# Patient Record
Sex: Female | Born: 1935
Health system: Southern US, Community
[De-identification: ages and names within clinical notes are randomized; demographics above are authoritative.]

## PROBLEM LIST (undated history)

## (undated) DIAGNOSIS — E079 Disorder of thyroid, unspecified: Secondary | ICD-10-CM

## (undated) DIAGNOSIS — I10 Essential (primary) hypertension: Secondary | ICD-10-CM

## (undated) DIAGNOSIS — K219 Gastro-esophageal reflux disease without esophagitis: Secondary | ICD-10-CM

## (undated) DIAGNOSIS — M3500 Sicca syndrome, unspecified: Secondary | ICD-10-CM

## (undated) DIAGNOSIS — K7460 Unspecified cirrhosis of liver: Secondary | ICD-10-CM

## (undated) DIAGNOSIS — I509 Heart failure, unspecified: Secondary | ICD-10-CM

## (undated) DIAGNOSIS — K922 Gastrointestinal hemorrhage, unspecified: Secondary | ICD-10-CM

## (undated) HISTORY — PX: OTHER SURGICAL HISTORY: SHX169

## (undated) HISTORY — PX: COLONOSCOPY WITH ESOPHAGOGASTRODUODENOSCOPY (EGD) AND ESOPHAGEAL DILATION (ED): SHX6495

---

## 2001-07-05 ENCOUNTER — Other Ambulatory Visit: Admission: RE | Admit: 2001-07-05 | Discharge: 2001-07-05 | Payer: Self-pay | Admitting: Internal Medicine

## 2001-07-19 ENCOUNTER — Encounter: Payer: Self-pay | Admitting: Internal Medicine

## 2001-07-19 ENCOUNTER — Ambulatory Visit (HOSPITAL_COMMUNITY): Admission: RE | Admit: 2001-07-19 | Discharge: 2001-07-19 | Payer: Self-pay | Admitting: Internal Medicine

## 2002-01-07 ENCOUNTER — Encounter: Payer: Self-pay | Admitting: Rheumatology

## 2002-01-07 ENCOUNTER — Ambulatory Visit (HOSPITAL_COMMUNITY): Admission: RE | Admit: 2002-01-07 | Discharge: 2002-01-07 | Payer: Self-pay | Admitting: Rheumatology

## 2002-02-14 ENCOUNTER — Ambulatory Visit (HOSPITAL_COMMUNITY): Admission: RE | Admit: 2002-02-14 | Discharge: 2002-02-14 | Payer: Self-pay | Admitting: Internal Medicine

## 2002-03-21 ENCOUNTER — Encounter (HOSPITAL_COMMUNITY): Admission: RE | Admit: 2002-03-21 | Discharge: 2002-04-20 | Payer: Self-pay | Admitting: Rheumatology

## 2002-05-10 ENCOUNTER — Ambulatory Visit (HOSPITAL_COMMUNITY): Admission: RE | Admit: 2002-05-10 | Discharge: 2002-05-10 | Payer: Self-pay | Admitting: Rheumatology

## 2002-07-11 ENCOUNTER — Encounter (HOSPITAL_COMMUNITY): Admission: RE | Admit: 2002-07-11 | Discharge: 2002-08-10 | Payer: Self-pay | Admitting: Oncology

## 2002-07-31 ENCOUNTER — Encounter (HOSPITAL_COMMUNITY): Admission: RE | Admit: 2002-07-31 | Discharge: 2002-08-30 | Payer: Self-pay | Admitting: *Deleted

## 2002-08-30 ENCOUNTER — Encounter (HOSPITAL_COMMUNITY): Admission: RE | Admit: 2002-08-30 | Discharge: 2002-09-29 | Payer: Self-pay | Admitting: *Deleted

## 2002-09-30 ENCOUNTER — Encounter (HOSPITAL_COMMUNITY): Admission: RE | Admit: 2002-09-30 | Discharge: 2002-10-30 | Payer: Self-pay | Admitting: *Deleted

## 2002-11-01 ENCOUNTER — Encounter (HOSPITAL_COMMUNITY): Admission: RE | Admit: 2002-11-01 | Discharge: 2002-12-01 | Payer: Self-pay | Admitting: *Deleted

## 2002-12-02 ENCOUNTER — Encounter (HOSPITAL_COMMUNITY): Admission: RE | Admit: 2002-12-02 | Discharge: 2003-01-01 | Payer: Self-pay | Admitting: *Deleted

## 2002-12-12 ENCOUNTER — Encounter (HOSPITAL_COMMUNITY): Admission: RE | Admit: 2002-12-12 | Discharge: 2003-01-11 | Payer: Self-pay | Admitting: Rheumatology

## 2003-02-18 ENCOUNTER — Ambulatory Visit (HOSPITAL_COMMUNITY): Admission: RE | Admit: 2003-02-18 | Discharge: 2003-02-18 | Payer: Self-pay | Admitting: Family Medicine

## 2003-02-18 ENCOUNTER — Encounter: Payer: Self-pay | Admitting: Family Medicine

## 2003-04-08 ENCOUNTER — Ambulatory Visit (HOSPITAL_COMMUNITY): Admission: RE | Admit: 2003-04-08 | Discharge: 2003-04-08 | Payer: Self-pay | Admitting: Family Medicine

## 2003-04-08 ENCOUNTER — Encounter: Payer: Self-pay | Admitting: Family Medicine

## 2003-05-29 ENCOUNTER — Encounter (HOSPITAL_COMMUNITY): Admission: RE | Admit: 2003-05-29 | Discharge: 2003-06-28 | Payer: Self-pay | Admitting: Rheumatology

## 2004-04-01 ENCOUNTER — Ambulatory Visit (HOSPITAL_COMMUNITY): Admission: RE | Admit: 2004-04-01 | Discharge: 2004-04-01 | Payer: Self-pay | Admitting: Family Medicine

## 2004-04-07 ENCOUNTER — Ambulatory Visit (HOSPITAL_COMMUNITY): Admission: RE | Admit: 2004-04-07 | Discharge: 2004-04-07 | Payer: Self-pay | Admitting: Family Medicine

## 2004-05-04 ENCOUNTER — Ambulatory Visit (HOSPITAL_COMMUNITY): Admission: RE | Admit: 2004-05-04 | Discharge: 2004-05-04 | Payer: Self-pay | Admitting: Pulmonary Disease

## 2005-04-12 ENCOUNTER — Ambulatory Visit (HOSPITAL_COMMUNITY): Admission: RE | Admit: 2005-04-12 | Discharge: 2005-04-12 | Payer: Self-pay | Admitting: Family Medicine

## 2007-06-27 ENCOUNTER — Ambulatory Visit (HOSPITAL_COMMUNITY): Admission: RE | Admit: 2007-06-27 | Discharge: 2007-06-27 | Payer: Self-pay | Admitting: Family Medicine

## 2007-11-14 ENCOUNTER — Ambulatory Visit (HOSPITAL_COMMUNITY): Admission: RE | Admit: 2007-11-14 | Discharge: 2007-11-14 | Payer: Self-pay | Admitting: Unknown Physician Specialty

## 2007-11-15 ENCOUNTER — Ambulatory Visit: Payer: Self-pay | Admitting: Cardiology

## 2008-06-13 ENCOUNTER — Ambulatory Visit (HOSPITAL_COMMUNITY): Admission: RE | Admit: 2008-06-13 | Discharge: 2008-06-13 | Payer: Self-pay | Admitting: Family Medicine

## 2010-08-02 ENCOUNTER — Ambulatory Visit: Payer: Self-pay | Admitting: Cardiology

## 2010-08-02 ENCOUNTER — Encounter: Payer: Self-pay | Admitting: Cardiology

## 2010-08-02 ENCOUNTER — Ambulatory Visit (HOSPITAL_COMMUNITY): Admission: RE | Admit: 2010-08-02 | Discharge: 2010-08-02 | Payer: Self-pay | Admitting: Family Medicine

## 2011-03-04 NOTE — Consult Note (Signed)
[name], [name]                        ACCOUNT NO.:  [id number]   MEDICAL RECORD NO.:  [id number]                   PATIENT TYPE:  OUT   LOCATION:  RDC                                  FACILITY:  [facility]   PHYSICIAN:  Aundra Dubin, M.D.            DATE OF BIRTH:  02-27-1933   DATE OF CONSULTATION:  05/29/2003  DATE OF DISCHARGE:                                   CONSULTATION   CHIEF COMPLAINT:  CREST syndrome.   HISTORY OF PRESENT ILLNESS:  [name] is a 77 year old woman with CREST  syndrome.  She reports that she has been doing better concerning the  dryness, particularly to her eyes.  She has actually had an occasion where  she has had a few tears.  She still has the punctal plugs.  She finds the  Salagen continues to help, and she ranges this 2 to 3 pills per day.  The  Raynaud's symptoms are mild.  She does better in the summer.  She has not  needed medicine for this for awhile.  Her heartburn symptoms are stable.  She has a list of questions and asks if the medicines are causing her hair  to thin, and none of the medicines are doing this.  Her energy level is  fair.  She has started exercising at [employer], and she feels better on the  days she exercises.  Her weight is down 6 pounds.  She says her breathing  has improved.   Her worst area of arthralgia is pain that radiates around the left lateral  hip and sometimes down her leg.  She says it moves around.  She cannot lie  on this at night, and generally walking does not bother this.   MEDICINES:  1. Salagen 5 mg b.i.d.  2. Prilosec 20 mg b.i.d.  3. HCTZ 25 mg daily.  4. Armor Thyroid 180 mg daily.  5. Eyedrops.  6. Glucosamine.  7. Premarin 0.625 mg three days weekly.   PHYSICAL EXAM:  Weight 131 pounds.  Blood pressure 126/72.  Respirations 14.  GENERAL:  She appears well.  LUNGS:  There are no crackles or rales at this time.  SKIN:  She has no true scleroderma to the fingers.  There is still the  positive nail-fold dilatation.  HEART:  Regular.  No murmur.  MUSCULOSKELETAL EXAM:  The hand shows sclerodactyly which is mild.  The  fingers are slightly cool and slightly whitish.  Wrist, elbow, and  shoulders, good range of motion.  Trigger points have mild tenderness.  The  bilateral hips move very well, and there is no pain in this area.  She is  moderately tender to the left trochanteric bursa area.  The knees and ankles  are not tender.   ASSESSMENT/PLAN:  1. CREST syndrome.  Basically, these symptoms are stable.  She also has     Sjogren's which is also stable.  Her  eyes are actually improved.  She     will continue with the p.r.n. use of Salagen.  She has not been on a     calcium channel blocker for the Raynaud's for close to one year at this     time.  If this worsens into the Fall, she will call and restart this.  I     again advised her to dress warmly to try and help this when needed.  She     will continue with the Prilosec for the reflux.  I get no sense that her     fatigue or breathing has worsened.  I do not feel that I need to further     evaluate at the present time with PFTs or an echocardiogram.  2. Left trochanteric bursitis.  I offered to inject this.  She was not     interested at the present time, and I have explained how she can stretch     this.  She also has some Vioxx at home which may help with this.  If this     worsens, she will call for an appointment and I can inject this.   She will return in six months.   Time:  26 minutes.                                               Aundra Dubin, M.D.    WWT/MEDQ  D:  05/29/2003  T:  05/29/2003  Job:  [id number]   cc:   Corrie Mckusick, M.D.  [address] [place]ure[place] Josephs. A  [place]  [place] [address]  Fax: [phone]

## 2011-03-04 NOTE — Procedures (Signed)
[name], [name]              ACCOUNT NO.:  [id number]   MEDICAL RECORD NO.:  [id number]          PATIENT TYPE:  OUT   LOCATION:  RAD                           FACILITY:  [facility]   PHYSICIAN:  Gerrit Friends. Dietrich Pates, MD, FACCDATE OF BIRTH:  08/08/1933   DATE OF PROCEDURE:  DATE OF DISCHARGE:                                ECHOCARDIOGRAM   CLINICAL DATA:  A 77 year old woman with Sjogren's syndrome and right  sided cardiac enlargement.   M-mode aorta 2.6, left atrium 4.0, septum 1.3, posterior wall 1.1, LV  diastole 3.3, LV systole 2.0.  1. Technically suboptimal but adequate echocardiographic study.  2. Left atrial size at the upper limit of normal; normal right atrium      and right ventricle.  3. Mild sclerosis of a trileaflet aortic valve.  4. Normal in diameter of the proximal ascending aorta; mild to      moderate calcification of the wall and annulus.  5. Normal pulmonic valve and proximal pulmonary artery.  6. Normal mitral valve; mild annular calcification; minimal      regurgitation.  7. Normal left ventricular size; LV wall thickness at the upper limit      of normal with more prominent proximal septal thickening.  Normal      regional and global LV systolic function.  8. Normal IVC.      Gerrit Friends. Dietrich Pates, MD, [facility]  Electronically Signed     RMR/MEDQ  D:  11/15/2007  T:  11/15/2007  Job:  [id number]

## 2011-03-04 NOTE — Consult Note (Signed)
[name], [name]                        ACCOUNT NO.:  [id number]   MEDICAL RECORD NO.:  [id number]                   PATIENT TYPE:  OUT   LOCATION:  RAD                                  FACILITY[facility]  [facility]   PHYSICIAN:  Aundra Dubin, M.D.            DATE OF BIRTH:  1933-09-12   DATE OF CONSULTATION:  07/11/2002  DATE OF DISCHARGE:  05/10/2002                                   CONSULTATION   CHIEF COMPLAINT:  CREST, Sjogren's, right shoulder/right hand.   HISTORY OF PRESENT ILLNESS:  The patient did have an echocardiogram which  estimated her right ventricular systolic pressure at 35-45 which is a mild  elevation.  This may imply that she has some mild pulmonary hypertension.  The pulmonary function test showed normal lung volumes and diffusing  capacity.  She had a blood gas showing 7.4/37/83.   At this time she is hurting in the left shoulder.  This has been going on  for several months and she is to see Dr. Hayden Rasmussen next week.  Also, she  is hurting in the right wrist.  She has been diagnosed with carpal tunnel  syndrome.  She has some mild numbness to her fingers at night.  She has just  started using a wrist splint.  She continues to have significant dryness to  her mouth.  Her eyes are not bad.  There has been really no swollen joints.  Her swallowing difficulties and heartburn are under control with treatment.  Her weight is stable.  She denies any shortness of breath or chest pain.   MEDICATIONS:  1. HCTZ 25 mg q.d.  2. Armour Thyroid 180 mg q.d.  3. Premarin 0.625 mg three days a week.  4. Prilosec 20 mg q.d.  5. Multivitamin.  6. Stanback p.r.n.  7. No Ambien.  8. Singulair p.r.n.  9. Flexeril p.r.n.  10.      Bextra 10 mg b.i.d.   PHYSICAL EXAMINATION:  VITAL SIGNS:  Weight 142 pounds, blood pressure  108/70, respirations 12.  GENERAL:  No distress.  SKIN:  She has positive nail fold dilatation.  There may be some trace  telangiectasias to the  lips.  Mouth is clear and the tongue appears to have  normal moisture at the moment.  NECK:  Negative JVD.  LUNGS:  Clear.  HEART:  Regular.  No murmur.  MUSCULOSKELETAL:  She has likely a positive Tinel sign to the right wrist.  The hands have some diffuse swelling to suggest sclerodactyly.  The left  shoulder has poor abduction leniency to less than 80 degrees.  Internal and  external rotation is also mildly limited.  Knees, ankles, and feet have a  good range of motion and show no active arthritis.   ASSESSMENT/PLAN:  1. CREST syndrome with Sjogren's.  Generally, these symptoms are stable, but     are persistent.  I will try her on Salagen  5 mg b.i.d.-t.i.d. p.r.n.  She     is advised that this can cause diarrhea, sweating, and nervousness among     other symptoms.  She will continue with Prilosec for her reflux.  Her     echocardiogram shows a hint of mild pulmonary hypertension.  We will     repeat the echocardiogram in about one year.  She is currently not short     of breath.  2. Left shoulder tendonitis.  We will have her follow through with the     appointment with Dr. Thomasena Edis.  3. Carpal tunnel syndrome.  If this is not improving with the wrist splint     or suggestions by Dr. Thomasena Edis, then I would give consideration to doing     an injection.   She will return in five weeks.                                               Aundra Dubin, M.D.    WWT/MEDQ  D:  07/11/2002  T:  07/11/2002  Job:  [id number]

## 2011-03-04 NOTE — Consult Note (Signed)
[facility]  Patient:    SHEY, YOTT Visit Number: [id number] MRN: [id number]          Service Type: RHE Location: [facility] Attending Physician:  Aundra Dubin Dictated by:   Nathaneil Canary, M.D. Admit Date:  03/21/2002   CC:         Colette Ribas, M.D.  Roetta Sessions, M.D.   Consultation Report  CHIEF COMPLAINT:  Sjogrens, CREST.  HISTORY OF PRESENT ILLNESS:  Ms. Devino returns reporting that the Evoxac did not help the moisture to her mouth or eyes.  She did see Dr. Roetta Sessions for an evaluation of her swallowing difficulty, and I believe she has had an esophageal dilatation procedure.  She feels that the swallowing is some better. She has found that the Xanax at night dries her eyes and mouth fairly severely and she uses this only occasionally.  She still has moderate insomnia.  She has been given a prescription for Ambien from Dr. Colette Ribas which would be a good choice.  She is not hurting accepts describes a few episodes that occur about once every four weeks where she hurts in her legs for a while at night.  When she stands up and moves, she is better.  She is still having significant dry eyes and dry mouth.  She tells me today that she has had punctal plugging for her eyes.  Her weight is down one and a half pounds.  She has moderate fatigue.  There is no complaint of shortness of breath.  MEDICATIONS: 1. HCTZ 25 mg q.d. 2. Levoxyl 0.125 mg q.d. 3. Premarin 0.625 mg q.d. 4. Aciphex 20 mg b.i.d. 5. MDI p.r.n. 6. Xanax p.r.n. 7. Folic acid 1 mg q.d. 8. Allegra.  PHYSICAL EXAMINATION:  VITAL SIGNS:  Weight 144 pounds.  Blood pressure 148/80, respirations 14.  GENERAL:  She appears tired.  LUNGS:  Clear.  HEART:  Regular.  No murmur.  EXTREMITIES:  Lower extremities no edema.  SKIN:  She has no signs of scleroderma.  She does have nailfold dilatation. The hands may show some slight sclerodactyly but this is  not as severe as it was in March.  The fingers are slightly cool but not blanched.  MUSCULOSKELETAL:  The wrists, hands, elbows, shoulders, knees, ankles, and feet have a good range of motion and show no active arthritis.  ASSESSMENT/PLAN: 1. Sjogrens and calcinosis, Raynaud disease, esophageal dysmotility,    sclerodactyly, and telegiectasia syndrome:  She is still having moderate    Raynauds symptoms and I will give her a trial of nifedipine 30 mg XL 1    q.d.  She is cautioned that this can be associated with ankle edema.    Concerning the full workup for CREST and because she has fatigue, I have    set her up for pulmonary function tests along with an echocardiogram to    rule out pulmonary hypertension.  She will continue her use of water to    moisten her mouth and eye drops. 2. Reflux:  She is working closely with Dr. Roetta Sessions. 3. Insomnia:  The Ambien would be a good choice to try as it may not as be    as drying as Xanax. 4. She will return in four months. Dictated by:   Nathaneil Canary, M.D. Attending Physician:  Aundra Dubin DD:  03/21/02 TD:  03/23/02 Job: [id number]

## 2011-03-04 NOTE — Procedures (Signed)
[facility]  Patient:    [name], [name] Visit Number: [id number] MRN: [id number]          Service Type: OUT Location: RAD Attending Physician:  Aundra Dubin Dictated by:   Kari Baars, M.D. Admit Date:  05/10/2002                      Pulmonary Function Test Inter.  IMPRESSION: 1. Spirometry is normal. 2. Lung volume shows normal lung capacity and perhaps an ______. 3. Diffuse capacity of carbon monoxide is normal. 4. Arterial blood gas are normal. Dictated by:   Kari Baars, M.D. Attending Physician:  Aundra Dubin DD:  05/11/02 TD:  05/14/02 Job: [id number]

## 2011-03-04 NOTE — Consult Note (Signed)
[name], [name]                        ACCOUNT NO.:  [id number]   MEDICAL RECORD NO.:  [id number]                   PATIENT TYPE:  REC   LOCATION:  REH                                  FACILITY:  [facility]   PHYSICIAN:  Aundra Dubin, M.D.            DATE OF BIRTH:  Apr 27, 1933   DATE OF CONSULTATION:  08/22/2002  DATE OF DISCHARGE:                                   CONSULTATION   COMPLAINT:  CREST Sjogren's right shoulder, right hand.   HISTORY OF PRESENT ILLNESS:  Ms. Roston has seen Dr. Thomasena Edis and his  associates in [place] concerning her left shoulder.  She has had an  injection and she is undergoing physical therapy.  She feels that it is  still quite symptomatic but is some better.  An MRI is being scheduled to  evaluate the shoulder.  She has been using the wrist splints at night and  the right hand is considerably better and there is really no pain in it  presently.  She is generally not hurting anywhere except the left shoulder.  She is breathing well.  The reflux is controlled with Prilosec.  She has not  been using nifedipine and has not had severe Raynaud problems.  There has  been no swelling joints, fever, cough, nausea.  She has used some of the  Salagen and this has helped some to her dry eyes and mouth.   CURRENT MEDICATIONS:  1. Salagen 5 mg b.i.d.  2. Armour thyroid 180 mg q.d.  3. Prilosec 20 mg q.d.  4. Standby Singulair p.r.n.  5. Flexeril p.r.n.  6. Dextra 10 mg b.i.d.  7. Premarin 0.625 mg 3 days a week.   PHYSICAL EXAMINATION:  Weight 139 pounds.  Blood pressure 140/80,  respiration rate 16.  GENERAL:  No distress.  SKIN:  Mild tightness to the fingers and there is nail fold dilatation.  There is no tightening of the skin proximal to the MCPs.  LUNGS:  Clear, negative JVD, normal thyroid.  HEART:  Regular, no murmur.  EXTREMITIES:  No edema.  MUSCULOSKELETAL:  Hands have a mild sclerodactyly appearance, the wrists and  elbows good range  of motion.  Left shoulder has poor abduction only to 90  degrees.  Knees, ankles and feet move well and are nontender.  NEUROLOGIC:  She has negative Tinel's sign to the right.   ASSESSMENT AND PLAN:  1. CREST with Sjogren's syndrome.  Generally, she is doing well and muscular     symptoms are fairly well controlled.  She feels that she can tolerate     Salagen 5 mg at a t.i.d. dose.  This will be increased.  I've given her a     prescription for nifedepine to use during the winter for the cold hands.     She is again advised to use hand creams to avoid cracking to the skin and  warm clothing to help alleviate this problem.  2. Mild pulmonary hypertension.  This is a problem we will be following with     a repeat echocardiogram with time.     She is not short of breath.  3. Reflux.  This is controlled.   DISPOSITION:  She will return in 4 months.                                               Aundra Dubin, M.D.    WWT/MEDQ  D:  08/22/2002  T:  08/22/2002  Job:  [id number]

## 2011-03-04 NOTE — Procedures (Signed)
NAMEWANDALEE, KLANG              ACCOUNT NO.:  [id number]   MEDICAL RECORD NO.:  [id number]          PATIENT TYPE:  OUT   LOCATION:  RESP                          FACILITY:  [facility]   PHYSICIAN:  Edward L. Juanetta Gosling, M.D.DATE OF BIRTH:  1933/09/10   DATE OF PROCEDURE:  DATE OF DISCHARGE:  11/14/2007                            PULMONARY FUNCTION TEST   1. Spirometry shows no definite ventilatory defect with evidence of      airflow obstruction.  2. Lung volumes show no restrictive change and evidence of air      trapping.  3. DLCO was normal.  4. Arterial blood gases are normal.      Edward L. Juanetta Gosling, M.D.  Electronically Signed     ELH/MEDQ  D:  11/16/2007  T:  11/16/2007  Job:  [id number]

## 2011-03-04 NOTE — Procedures (Signed)
NAME:  [name], [name]                        ACCOUNT NO.:  [id number]   MEDICAL RECORD NO.:  [id number]                   PATIENT TYPE:  OUT   LOCATION:  RAD                                  [facility]ACILITY:  [facility]   PHYSICIAN:  Vida Roller, M.D.                DATE OF BIRTH:  [birth year]   DATE OF PROCEDURE:  DATE OF DISCHARGE:                                  ECHOCARDIOGRAM   CONSULTING PHYSICIAN:  Vida Roller, M.D.   PRIMARY CARE PHYSICIAN:  Corrie Mckusick, M.D.   TAPE NUMBER:  [id number].   TAPE COUNT:  Is [phone].   This is a 77 year old female with Sjogren syndrome for evaluation of  pulmonary hypertension.  Echocardiogram previously was done, in July 2003,  which showed normal left sided structures, normal right sided structures,  mildly elevated right ventricular systolic pressure estimated at 35- to -45-  mmHg, normal sized inferior vena cava.   TECHNICAL QUALITY:  Adequate.   M-MODE TRACING:  1. The aorta is 24-mm.  2. The left atrium is 40-mm.  3. The septum is 13-mm.  4. Posterior wall is 11-mm.  5. Left ventricular diastolic dimension is 32-mm.  6. Left ventricular systolic dimension is 24-mm.   IMAGING TWO-DIMENSIONAL AND DOPPLER:  1. The left ventricle is normal size with normal systolic function.     Estimated ejection fraction is 60-65%.  There are no wall motion     abnormalities seen.  There is mild concentric left ventricular     hypertrophy with some predominance in the proximal portion of the septum.     There is no left ventricular outflow gradient by Doppler.  2. The right ventricle is mildly dilated.  The free wall appears to contract     normally.  There is evidence of an elevated right ventricular systolic     pressure estimated at 45- to -5-mmHg by the tricuspid regurgitation jet.  3. Both atria are enlarged, right greater than left.  There is no obvious     atrial septal defect.  4. The aortic valve is mildly sclerotic with no evidence of stenosis  or     regurgitation.  5. The mitral valve has mild anular calcification and mild regurgitation.  6. The Tricuspid valve has mild to moderate regurgitation.  No stenosis is     seen.  7. Pulmonic valve is not well seen.  8. The pericardial structures appear normal.  9. Ascending aorta is not well seen.  10.      The inferior vena cava appears to be just mildly dilated with     normal __________  variation.      ___________________________________________                                            Vida Roller,  M.D.   Derinda Sis  D:  05/04/2004  T:  05/04/2004  Job:  [id number]

## 2011-03-04 NOTE — Procedures (Signed)
   NAMEZIARA, THELANDER                        ACCOUNT NO.:  [id number]   MEDICAL RECORD NO.:  [id number]                   PATIENT TYPE:  OUT   LOCATION:  RAD                                  FACILITY:  [facility]   PHYSICIAN:  Gerrit Friends. Dietrich Pates, M.D. [facility]        DATE OF BIRTH:  01/16/1933   DATE OF PROCEDURE:  05/10/2002  DATE OF DISCHARGE:  05/10/2002                                  ECHOCARDIOGRAM   CLINICAL DATA:  This is a 77 year old woman with pulmonary hypertension,  history of Sjogren's disease.   SUMMARY:  1. Technically adequate echocardiographic study.  2. Normal left atrium, right atrium and right ventricle.  3. Slight mitral valve thickening with very mild regurgitation.  4. Slight aortic valve thickening with very mild regurgitation.  5. Normal tricuspid and pulmonic valves with very mild tricuspid     regurgitation.  6. Estimated RV systolic pressure is approximately 35-45 mmHg representing     only a mild elevation.  7. Normal internal dimension, wall thickness, regional and global function     of the left ventricle.  8. Normal IVC suggesting normal right atrial pressure.  9. No pericardial effusion.                                               Gerrit Friends. Dietrich Pates, M.D. [facility]    RMR/MEDQ  D:  05/10/2002  T:  05/20/2002  Job:  [id number]   cc:   Gerrit Friends. Dietrich Pates, M.D. [facility]  [address]  [place]  [place] [address]  Fax: [phone]   Aundra Dubin, M.D.

## 2011-03-04 NOTE — Consult Note (Signed)
NAMECOSIMA, [name]                        ACCOUNT NO.:  [id number]   MEDICAL RECORD NO.:  [id number]                   PATIENT TYPE:  REC   LOCATION:  REH                                  [facility]ACILITY:  [facility]   PHYSICIAN:  Aundra Dubin, M.D.            DATE OF BIRTH:  [birth year]   DATE OF CONSULTATION:  12/12/2002  DATE OF DISCHARGE:                                   CONSULTATION   CHIEF COMPLAINT:  CREST syndrome.   HISTORY OF PRESENT ILLNESS:  The patient had surgery to the left shoulder in  early December with Dr. Thomasena Edis and is doing considerably better.  It took  several weeks to improve but right now she is moving it almost pain free.  She did not use the nifedipine that I gave her in November but tells me that  her Raynaud's symptoms are under control.  She does have blanching and  coldness to her fingers but this is not bad.  She has some numbness and  burning sensation to her feet for a few minutes when she goes to bed but  this resolves and is not interfering with her sleep.  Her weight is stable.  She is not having significant fatigue.  There is no shortness of breath,  chest pain, active rashes, headaches, jaw claudication, visual changes.  She  continues to have the dry eyes and dry mouth but the Salagen has helped.   In review of some of her data, she has an echocardiogram from May 10, 2002  which shows a slightly elevated RV systolic pressure of 35-40 mmHg.  Her  PFT's showed no diffusion capacity abnormality and the lung volumes were  normal.   MEDICATIONS:  1. Salagen 5 mg b.i.d.  2. Prilosec 20 mg b.i.d.  3. Armour thyroid 180 mg daily.  4. Folic acid daily.  5. Calcium 1000 mg daily.  6. Premarin 0.625 mg every three days.  7. HCTZ 25 mg daily.   PHYSICAL EXAMINATION:  VITAL SIGNS:  Weight 137 pounds, blood pressure  128/60, respirations 16.  GENERAL:  She appears well.  SKIN:  She has nail fold dilatation.  The tips of the fingers are cool and  slightly reddish.  LUNGS:  Clear.  No rales.  NECK:  Negative JVD.  Normal thyroid.  HEART:  Regular.  No murmur.  MUSCULOSKELETAL:  She has mild sclerodactyly but these areas are nontender.  The wrists, elbows, and right shoulder full range of motion.  The left  shoulder has mild decreased range of motion and she abducts easily to 140  degrees.  Knees, ankles, and feet are nontender and have a full range of  motion.   ASSESSMENT AND PLAN:  1. Calcinosis, Raynaud's disease, esophageal dysmotility, sclerodactyly, and     telangiectasia syndrome.  She is not needing the nifedipine at this point     and I will not pursue this with her presently  unless the symptoms worsen.     She will continue with the Salagen for her Sjogren's syndrome.  Overall,     she is stable.  2. Reflux.  She has increased the Prilosec to 20 mg b.i.d.  This was on     recommendations by her ear, nose, and throat physician because of some     hoarseness.  Her vocal cords were visualized and there are no tumors or     masses.  3. Mild pulmonary hypertension.  Later this year, we will likely repeat the     echocardiogram.  She is asymptomatic concerning these problems.   She will return in six months.                                               Aundra Dubin, M.D.    WWT/MEDQ  D:  12/12/2002  T:  12/12/2002  Job:  [id number]   cc:   Corrie Mckusick, M.D.  [address] [place]. [place]  [place]  [place] [address]  Fax: [phone]

## 2011-03-04 NOTE — Op Note (Signed)
[facility]  Patient:    [name], [name] Visit Number: [id number] MRN: [id number]          Service Type: END Location: DAY Attending Physician:  Jonathon Bellows Dictated by:   Roetta Sessions, M.D. Proc. Date: 02/14/02 Admit Date:  02/14/2002   CC:         Arna Snipe, M.D.  [facility]  Aundra Dubin, M.D.   Operative Report  PROCEDURE:  Esophagogastroduodenoscopy with [facility] dilation.  INDICATIONS FOR PROCEDURE:  The patient is a 77 year old lady with longstanding esophageal dysphagia worsening recently. She has gastroesophageal reflux disease as well. She has been recently started on Aciphex 20 mg orally daily which has made tremendous improvement in her reflux symptoms. Recently barium pill esophagogram demonstrated reflux up to the level of the clavicle and a web versus ring at the EG junction which obstructed the passage of the pill. EGD is now being done to further evaluate her symptoms. This approach has been discussed with [name] at length. The potential risks, benefits, and alternatives have been reviewed and questions and answered. Please see my February 11, 2002 consultation note.  MONITORING:  O2 saturations, blood pressure, pulse and respirations were monitored throughout the entirety of the procedure.  CONSCIOUS SEDATION:  Versed 4 mg IV in divided doses, fentanyl 50 mcg IV, cetacaine spray for topical oropharyngeal anesthesia.  INSTRUMENT:  Olympus diagnostic gastroscope.  FINDINGS:  Examination of the tubular esophagus revealed a web versus component of a ring at the EG junction. There were multiple pseudodiverticula in the distal esophagus but they were not deep. There was no associated mucosal inflammation. There was no evidence of Barretts esophagus, neoplasm. The EG junction was easily traversed with the scope.  STOMACH:  The gastric cavity was empty and insufflated well with air.  Thorough examination of the gastric mucosa including retroflexed view of the proximal stomach and esophagogastric junction demonstrated only a small to moderate size hiatal hernia. The pylorus was patent and easily traversed.  DUODENUM:  The bulb and second portion appeared normal.  THERAPEUTIC/DIAGNOSTIC MANEUVERS PERFORMED:  A 54 French Maloney dilator was passed to full insertion with good patient tolerance. A look back revealed the ring web had been ruptured without apparent complications.  The patient tolerated the procedure well and was reactive.  IMPRESSION:  1. Abnormal appearing esophagus with pseudodiverticula distally and a     short ring/web which appeared to be critical at the EG junction.  2. Small to moderate size hiatal hernia. The remainder of her stomach and     duodenum through the second portion appeared normal status post passage     of the 61 [place] Maloney dilator with successful rupture of the web.  [name] needs to be treated aggressively for gastroesophageal reflux disease.  She was recently started on Aciphex which had been associated with marked improvement in her symptoms. Will continue her on Aciphex 20 mg orally daily, antireflux measures. Will see her back in the office in four weeks. Dictated by:   Roetta Sessions, M.D. Attending Physician:  Jonathon Bellows DD:  02/14/02 TD:  02/15/02 Job: [id number]

## 2011-03-22 ENCOUNTER — Ambulatory Visit (HOSPITAL_COMMUNITY)
Admission: RE | Admit: 2011-03-22 | Discharge: 2011-03-22 | Disposition: A | Discharge status: Home / Self Care | Payer: Medicare Other | Source: Ambulatory Visit | Attending: Family Medicine | Admitting: Family Medicine

## 2011-03-22 ENCOUNTER — Other Ambulatory Visit (HOSPITAL_COMMUNITY): Payer: Self-pay | Admitting: Family Medicine

## 2011-03-22 ENCOUNTER — Encounter (HOSPITAL_COMMUNITY): Payer: Self-pay

## 2011-03-22 DIAGNOSIS — M25549 Pain in joints of unspecified hand: Secondary | ICD-10-CM

## 2011-03-22 DIAGNOSIS — M899 Disorder of bone, unspecified: Secondary | ICD-10-CM | POA: Insufficient documentation

## 2011-03-22 HISTORY — DX: Essential (primary) hypertension: I10

## 2011-07-08 LAB — BLOOD GAS, ARTERIAL
Acid-Base Excess: 1.6
FIO2: 0.21
O2 Saturation: 96.5
pCO2 arterial: 40

## 2012-01-09 ENCOUNTER — Other Ambulatory Visit (HOSPITAL_COMMUNITY): Payer: Self-pay | Admitting: Family Medicine

## 2012-01-09 DIAGNOSIS — Z139 Encounter for screening, unspecified: Secondary | ICD-10-CM

## 2012-01-09 DIAGNOSIS — E559 Vitamin D deficiency, unspecified: Secondary | ICD-10-CM

## 2012-01-09 DIAGNOSIS — E785 Hyperlipidemia, unspecified: Secondary | ICD-10-CM | POA: Diagnosis not present

## 2012-01-09 DIAGNOSIS — IMO0002 Reserved for concepts with insufficient information to code with codable children: Secondary | ICD-10-CM | POA: Diagnosis not present

## 2012-01-09 DIAGNOSIS — E039 Hypothyroidism, unspecified: Secondary | ICD-10-CM | POA: Diagnosis not present

## 2012-01-10 ENCOUNTER — Ambulatory Visit (HOSPITAL_COMMUNITY)
Admission: RE | Admit: 2012-01-10 | Discharge: 2012-01-10 | Disposition: A | Discharge status: Home / Self Care | Payer: Medicare Other | Source: Ambulatory Visit | Attending: Family Medicine | Admitting: Family Medicine

## 2012-01-10 DIAGNOSIS — E559 Vitamin D deficiency, unspecified: Secondary | ICD-10-CM | POA: Diagnosis not present

## 2012-01-10 DIAGNOSIS — Z13820 Encounter for screening for osteoporosis: Secondary | ICD-10-CM | POA: Diagnosis not present

## 2012-01-10 DIAGNOSIS — E039 Hypothyroidism, unspecified: Secondary | ICD-10-CM | POA: Diagnosis not present

## 2012-01-10 DIAGNOSIS — Z780 Asymptomatic menopausal state: Secondary | ICD-10-CM | POA: Diagnosis not present

## 2012-01-10 DIAGNOSIS — Z139 Encounter for screening, unspecified: Secondary | ICD-10-CM

## 2012-01-10 DIAGNOSIS — R29890 Loss of height: Secondary | ICD-10-CM | POA: Diagnosis not present

## 2012-01-10 DIAGNOSIS — M899 Disorder of bone, unspecified: Secondary | ICD-10-CM | POA: Diagnosis not present

## 2012-01-10 DIAGNOSIS — M949 Disorder of cartilage, unspecified: Secondary | ICD-10-CM | POA: Diagnosis not present

## 2012-01-10 DIAGNOSIS — E785 Hyperlipidemia, unspecified: Secondary | ICD-10-CM | POA: Diagnosis not present

## 2012-01-11 DIAGNOSIS — M349 Systemic sclerosis, unspecified: Secondary | ICD-10-CM | POA: Diagnosis not present

## 2012-01-11 DIAGNOSIS — M3500 Sicca syndrome, unspecified: Secondary | ICD-10-CM | POA: Diagnosis not present

## 2012-02-21 DIAGNOSIS — Z6825 Body mass index (BMI) 25.0-25.9, adult: Secondary | ICD-10-CM | POA: Diagnosis not present

## 2012-02-21 DIAGNOSIS — E785 Hyperlipidemia, unspecified: Secondary | ICD-10-CM | POA: Diagnosis not present

## 2012-02-21 DIAGNOSIS — E039 Hypothyroidism, unspecified: Secondary | ICD-10-CM | POA: Diagnosis not present

## 2012-04-16 DIAGNOSIS — Z6825 Body mass index (BMI) 25.0-25.9, adult: Secondary | ICD-10-CM | POA: Diagnosis not present

## 2012-04-16 DIAGNOSIS — H16229 Keratoconjunctivitis sicca, not specified as Sjogren's, unspecified eye: Secondary | ICD-10-CM | POA: Diagnosis not present

## 2012-04-16 DIAGNOSIS — E039 Hypothyroidism, unspecified: Secondary | ICD-10-CM | POA: Diagnosis not present

## 2012-06-29 DIAGNOSIS — H04129 Dry eye syndrome of unspecified lacrimal gland: Secondary | ICD-10-CM | POA: Diagnosis not present

## 2012-07-17 DIAGNOSIS — IMO0002 Reserved for concepts with insufficient information to code with codable children: Secondary | ICD-10-CM | POA: Diagnosis not present

## 2012-07-17 DIAGNOSIS — J069 Acute upper respiratory infection, unspecified: Secondary | ICD-10-CM | POA: Diagnosis not present

## 2012-07-17 DIAGNOSIS — E039 Hypothyroidism, unspecified: Secondary | ICD-10-CM | POA: Diagnosis not present

## 2012-07-18 DIAGNOSIS — H524 Presbyopia: Secondary | ICD-10-CM | POA: Diagnosis not present

## 2012-07-18 DIAGNOSIS — H04129 Dry eye syndrome of unspecified lacrimal gland: Secondary | ICD-10-CM | POA: Diagnosis not present

## 2012-07-18 DIAGNOSIS — H5200 Hypermetropia, unspecified eye: Secondary | ICD-10-CM | POA: Diagnosis not present

## 2012-07-18 DIAGNOSIS — H52229 Regular astigmatism, unspecified eye: Secondary | ICD-10-CM | POA: Diagnosis not present

## 2012-08-01 DIAGNOSIS — M3500 Sicca syndrome, unspecified: Secondary | ICD-10-CM | POA: Diagnosis not present

## 2012-09-06 DIAGNOSIS — K219 Gastro-esophageal reflux disease without esophagitis: Secondary | ICD-10-CM | POA: Diagnosis not present

## 2012-09-06 DIAGNOSIS — J069 Acute upper respiratory infection, unspecified: Secondary | ICD-10-CM | POA: Diagnosis not present

## 2012-09-06 DIAGNOSIS — IMO0002 Reserved for concepts with insufficient information to code with codable children: Secondary | ICD-10-CM | POA: Diagnosis not present

## 2012-09-06 DIAGNOSIS — E039 Hypothyroidism, unspecified: Secondary | ICD-10-CM | POA: Diagnosis not present

## 2012-09-20 DIAGNOSIS — E039 Hypothyroidism, unspecified: Secondary | ICD-10-CM | POA: Diagnosis not present

## 2012-09-20 DIAGNOSIS — J309 Allergic rhinitis, unspecified: Secondary | ICD-10-CM | POA: Diagnosis not present

## 2012-10-15 DIAGNOSIS — Z79899 Other long term (current) drug therapy: Secondary | ICD-10-CM | POA: Diagnosis not present

## 2012-10-15 DIAGNOSIS — D539 Nutritional anemia, unspecified: Secondary | ICD-10-CM | POA: Diagnosis not present

## 2013-01-30 DIAGNOSIS — M3500 Sicca syndrome, unspecified: Secondary | ICD-10-CM | POA: Diagnosis not present

## 2013-01-30 DIAGNOSIS — I7300 Raynaud's syndrome without gangrene: Secondary | ICD-10-CM | POA: Diagnosis not present

## 2013-01-30 DIAGNOSIS — K219 Gastro-esophageal reflux disease without esophagitis: Secondary | ICD-10-CM | POA: Diagnosis not present

## 2013-01-30 DIAGNOSIS — M349 Systemic sclerosis, unspecified: Secondary | ICD-10-CM | POA: Diagnosis not present

## 2013-02-22 ENCOUNTER — Other Ambulatory Visit (HOSPITAL_COMMUNITY): Payer: Medicare Other

## 2013-02-25 DIAGNOSIS — R5381 Other malaise: Secondary | ICD-10-CM | POA: Diagnosis not present

## 2013-02-25 DIAGNOSIS — E039 Hypothyroidism, unspecified: Secondary | ICD-10-CM | POA: Diagnosis not present

## 2013-02-25 DIAGNOSIS — IMO0002 Reserved for concepts with insufficient information to code with codable children: Secondary | ICD-10-CM | POA: Diagnosis not present

## 2013-02-26 ENCOUNTER — Ambulatory Visit (HOSPITAL_COMMUNITY)
Admission: RE | Admit: 2013-02-26 | Discharge: 2013-02-26 | Disposition: A | Discharge status: Home / Self Care | Payer: Medicare Other | Source: Ambulatory Visit | Attending: Cardiology | Admitting: Cardiology

## 2013-02-26 DIAGNOSIS — I2510 Atherosclerotic heart disease of native coronary artery without angina pectoris: Secondary | ICD-10-CM | POA: Insufficient documentation

## 2013-02-26 DIAGNOSIS — M349 Systemic sclerosis, unspecified: Secondary | ICD-10-CM | POA: Insufficient documentation

## 2013-02-26 DIAGNOSIS — I359 Nonrheumatic aortic valve disorder, unspecified: Secondary | ICD-10-CM | POA: Diagnosis not present

## 2013-02-26 NOTE — Progress Notes (Signed)
*  PRELIMINARY RESULTS* Echocardiogram 2D Echocardiogram has been performed.  Conrad  02/26/2013, 1:24 PM

## 2013-03-27 DIAGNOSIS — L259 Unspecified contact dermatitis, unspecified cause: Secondary | ICD-10-CM | POA: Diagnosis not present

## 2013-06-06 DIAGNOSIS — L259 Unspecified contact dermatitis, unspecified cause: Secondary | ICD-10-CM | POA: Diagnosis not present

## 2013-06-21 DIAGNOSIS — E063 Autoimmune thyroiditis: Secondary | ICD-10-CM | POA: Diagnosis not present

## 2013-06-21 DIAGNOSIS — E039 Hypothyroidism, unspecified: Secondary | ICD-10-CM | POA: Diagnosis not present

## 2013-06-26 ENCOUNTER — Other Ambulatory Visit (HOSPITAL_COMMUNITY): Payer: Self-pay

## 2013-06-26 DIAGNOSIS — M349 Systemic sclerosis, unspecified: Secondary | ICD-10-CM

## 2013-07-01 ENCOUNTER — Ambulatory Visit (HOSPITAL_COMMUNITY)
Admission: RE | Admit: 2013-07-01 | Discharge: 2013-07-01 | Disposition: A | Discharge status: Home / Self Care | Payer: Medicare Other | Source: Ambulatory Visit | Attending: Family Medicine | Admitting: Family Medicine

## 2013-07-01 DIAGNOSIS — J811 Chronic pulmonary edema: Secondary | ICD-10-CM | POA: Insufficient documentation

## 2013-07-01 DIAGNOSIS — M349 Systemic sclerosis, unspecified: Secondary | ICD-10-CM | POA: Diagnosis not present

## 2013-07-02 NOTE — Procedures (Signed)
[name], [name]              ACCOUNT NO.:  [id number]  MEDICAL RECORD NO.:  [id number]  LOCATION:  RESP                          FACILITY:  [facility]  PHYSICIAN:  Tonyetta Berko L. Juanetta Gosling, M.D.DATE OF BIRTH:  1933-07-29  DATE OF PROCEDURE:  07/01/2013 DATE OF DISCHARGE:  07/01/2013                           PULMONARY FUNCTION TEST   REASON FOR PULMONARY FUNCTION TESTING:  Cutaneous scleroderma.  1. Spirometry is normal. 2. Lung volumes are normal. 3. DLCO is minimally reduced. 4. Airway resistance is slightly high, which may indicate the evidence     of airflow obstruction, and clinical correlation is suggested. 5. There is minimal obstructive pulmonary edema.     Tauna Macfarlane L. Juanetta Gosling, M.D.     ELH/MEDQ  D:  07/01/2013  T:  07/02/2013  Job:  [id number]

## 2013-07-03 LAB — PULMONARY FUNCTION TEST

## 2013-07-09 DIAGNOSIS — L570 Actinic keratosis: Secondary | ICD-10-CM | POA: Diagnosis not present

## 2013-07-09 DIAGNOSIS — L259 Unspecified contact dermatitis, unspecified cause: Secondary | ICD-10-CM | POA: Diagnosis not present

## 2013-08-07 DIAGNOSIS — M349 Systemic sclerosis, unspecified: Secondary | ICD-10-CM | POA: Diagnosis not present

## 2013-08-07 DIAGNOSIS — M3500 Sicca syndrome, unspecified: Secondary | ICD-10-CM | POA: Diagnosis not present

## 2013-12-02 DIAGNOSIS — J209 Acute bronchitis, unspecified: Secondary | ICD-10-CM | POA: Diagnosis not present

## 2013-12-02 DIAGNOSIS — Z6825 Body mass index (BMI) 25.0-25.9, adult: Secondary | ICD-10-CM | POA: Diagnosis not present

## 2013-12-02 DIAGNOSIS — J069 Acute upper respiratory infection, unspecified: Secondary | ICD-10-CM | POA: Diagnosis not present

## 2014-03-05 DIAGNOSIS — E785 Hyperlipidemia, unspecified: Secondary | ICD-10-CM | POA: Diagnosis not present

## 2014-03-05 DIAGNOSIS — IMO0002 Reserved for concepts with insufficient information to code with codable children: Secondary | ICD-10-CM | POA: Diagnosis not present

## 2014-03-05 DIAGNOSIS — J0100 Acute maxillary sinusitis, unspecified: Secondary | ICD-10-CM | POA: Diagnosis not present

## 2014-03-05 DIAGNOSIS — J209 Acute bronchitis, unspecified: Secondary | ICD-10-CM | POA: Diagnosis not present

## 2014-03-05 DIAGNOSIS — R5381 Other malaise: Secondary | ICD-10-CM | POA: Diagnosis not present

## 2014-03-05 DIAGNOSIS — E039 Hypothyroidism, unspecified: Secondary | ICD-10-CM | POA: Diagnosis not present

## 2014-06-09 DIAGNOSIS — K219 Gastro-esophageal reflux disease without esophagitis: Secondary | ICD-10-CM | POA: Diagnosis not present

## 2014-06-09 DIAGNOSIS — Z6825 Body mass index (BMI) 25.0-25.9, adult: Secondary | ICD-10-CM | POA: Diagnosis not present

## 2014-06-09 DIAGNOSIS — E039 Hypothyroidism, unspecified: Secondary | ICD-10-CM | POA: Diagnosis not present

## 2014-07-23 DIAGNOSIS — M349 Systemic sclerosis, unspecified: Secondary | ICD-10-CM | POA: Diagnosis not present

## 2014-07-23 DIAGNOSIS — M3500 Sicca syndrome, unspecified: Secondary | ICD-10-CM | POA: Diagnosis not present

## 2014-07-23 DIAGNOSIS — K219 Gastro-esophageal reflux disease without esophagitis: Secondary | ICD-10-CM | POA: Diagnosis not present

## 2014-08-06 ENCOUNTER — Other Ambulatory Visit (HOSPITAL_COMMUNITY): Payer: Self-pay | Admitting: Internal Medicine

## 2014-08-06 ENCOUNTER — Ambulatory Visit (HOSPITAL_COMMUNITY)
Admission: RE | Admit: 2014-08-06 | Discharge: 2014-08-06 | Disposition: A | Discharge status: Home / Self Care | Payer: Medicare Other | Source: Ambulatory Visit | Attending: Cardiology | Admitting: Cardiology

## 2014-08-06 DIAGNOSIS — I083 Combined rheumatic disorders of mitral, aortic and tricuspid valves: Secondary | ICD-10-CM | POA: Insufficient documentation

## 2014-08-06 DIAGNOSIS — R0600 Dyspnea, unspecified: Secondary | ICD-10-CM | POA: Diagnosis not present

## 2014-08-06 DIAGNOSIS — I272 Other secondary pulmonary hypertension: Secondary | ICD-10-CM | POA: Insufficient documentation

## 2014-08-06 DIAGNOSIS — I359 Nonrheumatic aortic valve disorder, unspecified: Secondary | ICD-10-CM | POA: Diagnosis not present

## 2014-08-06 NOTE — Progress Notes (Signed)
  Echocardiogram 2D Echocardiogram has been performed.  [name] 08/06/2014, 3:36 PM

## 2014-08-07 NOTE — CV Procedure (Signed)
Echocardiogram report faxed to ordering MD per request. Report faxed to Dr Kayleen MemosSadiq Ali at [phone] at [facility]. Ordered under Dr. Tivis RingerNkechinyere Emejuaiwe.Veda Canning.  Cindy Kashayla Ungerer, RDCS

## 2014-08-08 DIAGNOSIS — E039 Hypothyroidism, unspecified: Secondary | ICD-10-CM | POA: Diagnosis not present

## 2014-08-11 DIAGNOSIS — E063 Autoimmune thyroiditis: Secondary | ICD-10-CM | POA: Diagnosis not present

## 2014-08-11 DIAGNOSIS — K219 Gastro-esophageal reflux disease without esophagitis: Secondary | ICD-10-CM | POA: Diagnosis not present

## 2014-08-11 DIAGNOSIS — Z23 Encounter for immunization: Secondary | ICD-10-CM | POA: Diagnosis not present

## 2014-08-11 DIAGNOSIS — Z6823 Body mass index (BMI) 23.0-23.9, adult: Secondary | ICD-10-CM | POA: Diagnosis not present

## 2014-10-27 DIAGNOSIS — E782 Mixed hyperlipidemia: Secondary | ICD-10-CM | POA: Diagnosis not present

## 2014-10-27 DIAGNOSIS — I270 Primary pulmonary hypertension: Secondary | ICD-10-CM | POA: Diagnosis not present

## 2014-10-27 DIAGNOSIS — E039 Hypothyroidism, unspecified: Secondary | ICD-10-CM | POA: Diagnosis not present

## 2014-10-27 DIAGNOSIS — E559 Vitamin D deficiency, unspecified: Secondary | ICD-10-CM | POA: Diagnosis not present

## 2014-11-04 ENCOUNTER — Other Ambulatory Visit (HOSPITAL_COMMUNITY): Payer: Self-pay | Admitting: Family Medicine

## 2014-11-04 ENCOUNTER — Ambulatory Visit (HOSPITAL_COMMUNITY)
Admission: RE | Admit: 2014-11-04 | Discharge: 2014-11-04 | Disposition: A | Discharge status: Home / Self Care | Payer: Medicare Other | Source: Ambulatory Visit | Attending: Family Medicine | Admitting: Family Medicine

## 2014-11-04 DIAGNOSIS — R05 Cough: Secondary | ICD-10-CM | POA: Insufficient documentation

## 2014-11-04 DIAGNOSIS — J40 Bronchitis, not specified as acute or chronic: Secondary | ICD-10-CM | POA: Diagnosis not present

## 2014-11-04 DIAGNOSIS — Z6823 Body mass index (BMI) 23.0-23.9, adult: Secondary | ICD-10-CM | POA: Diagnosis not present

## 2014-11-04 DIAGNOSIS — R0989 Other specified symptoms and signs involving the circulatory and respiratory systems: Secondary | ICD-10-CM | POA: Diagnosis not present

## 2014-11-04 DIAGNOSIS — J069 Acute upper respiratory infection, unspecified: Secondary | ICD-10-CM

## 2015-01-28 DIAGNOSIS — H5203 Hypermetropia, bilateral: Secondary | ICD-10-CM | POA: Diagnosis not present

## 2015-01-28 DIAGNOSIS — H25819 Combined forms of age-related cataract, unspecified eye: Secondary | ICD-10-CM | POA: Diagnosis not present

## 2015-01-28 DIAGNOSIS — H52223 Regular astigmatism, bilateral: Secondary | ICD-10-CM | POA: Diagnosis not present

## 2015-01-28 DIAGNOSIS — H524 Presbyopia: Secondary | ICD-10-CM | POA: Diagnosis not present

## 2015-02-10 DIAGNOSIS — L259 Unspecified contact dermatitis, unspecified cause: Secondary | ICD-10-CM | POA: Diagnosis not present

## 2015-03-12 DIAGNOSIS — H04123 Dry eye syndrome of bilateral lacrimal glands: Secondary | ICD-10-CM | POA: Diagnosis not present

## 2015-03-12 DIAGNOSIS — H16223 Keratoconjunctivitis sicca, not specified as Sjogren's, bilateral: Secondary | ICD-10-CM | POA: Diagnosis not present

## 2015-05-14 DIAGNOSIS — H16223 Keratoconjunctivitis sicca, not specified as Sjogren's, bilateral: Secondary | ICD-10-CM | POA: Diagnosis not present

## 2015-05-14 DIAGNOSIS — H04123 Dry eye syndrome of bilateral lacrimal glands: Secondary | ICD-10-CM | POA: Diagnosis not present

## 2015-06-11 DIAGNOSIS — H16223 Keratoconjunctivitis sicca, not specified as Sjogren's, bilateral: Secondary | ICD-10-CM | POA: Diagnosis not present

## 2015-06-11 DIAGNOSIS — H04123 Dry eye syndrome of bilateral lacrimal glands: Secondary | ICD-10-CM | POA: Diagnosis not present

## 2015-06-11 DIAGNOSIS — H01009 Unspecified blepharitis unspecified eye, unspecified eyelid: Secondary | ICD-10-CM | POA: Diagnosis not present

## 2015-07-22 DIAGNOSIS — M349 Systemic sclerosis, unspecified: Secondary | ICD-10-CM | POA: Diagnosis not present

## 2015-07-22 DIAGNOSIS — I10 Essential (primary) hypertension: Secondary | ICD-10-CM | POA: Diagnosis not present

## 2015-07-22 DIAGNOSIS — M3500 Sicca syndrome, unspecified: Secondary | ICD-10-CM | POA: Diagnosis not present

## 2015-07-23 ENCOUNTER — Other Ambulatory Visit (HOSPITAL_COMMUNITY): Payer: Self-pay | Admitting: Respiratory Therapy

## 2015-07-24 ENCOUNTER — Other Ambulatory Visit (HOSPITAL_COMMUNITY): Payer: Self-pay | Admitting: Respiratory Therapy

## 2015-07-24 DIAGNOSIS — M349 Systemic sclerosis, unspecified: Secondary | ICD-10-CM

## 2015-08-25 DIAGNOSIS — Z6824 Body mass index (BMI) 24.0-24.9, adult: Secondary | ICD-10-CM | POA: Diagnosis not present

## 2015-08-25 DIAGNOSIS — E782 Mixed hyperlipidemia: Secondary | ICD-10-CM | POA: Diagnosis not present

## 2015-08-25 DIAGNOSIS — Z1389 Encounter for screening for other disorder: Secondary | ICD-10-CM | POA: Diagnosis not present

## 2015-08-25 DIAGNOSIS — E039 Hypothyroidism, unspecified: Secondary | ICD-10-CM | POA: Diagnosis not present

## 2015-08-26 ENCOUNTER — Ambulatory Visit (HOSPITAL_COMMUNITY)
Admission: RE | Admit: 2015-08-26 | Discharge: 2015-08-26 | Disposition: A | Discharge status: Home / Self Care | Payer: Medicare Other | Source: Ambulatory Visit | Attending: Internal Medicine | Admitting: Internal Medicine

## 2015-08-26 DIAGNOSIS — R0609 Other forms of dyspnea: Secondary | ICD-10-CM | POA: Diagnosis not present

## 2015-08-26 DIAGNOSIS — R0600 Dyspnea, unspecified: Secondary | ICD-10-CM

## 2015-08-26 LAB — PULMONARY FUNCTION TEST
DL/VA % pred: 86 %
DL/VA: 3.82 ml/min/mmHg/L
DLCO unc % pred: 74 %
DLCO unc: 15.1 ml/min/mmHg
FEF 25-75 PRE: 1.31 L/s
FEF2575-%PRED-PRE: 106 %
FEV1-%PRED-PRE: 106 %
FEV1-PRE: 1.76 L
FEV1FVC-%PRED-PRE: 102 %
FEV6-%Pred-Pre: 110 %
FEV6-PRE: 2.33 L
FEV6FVC-%Pred-Pre: 106 %
FVC-%Pred-Pre: 104 %
FVC-Pre: 2.33 L
PRE FEV1/FVC RATIO: 76 %
Pre FEV6/FVC Ratio: 100 %
RV % pred: 107 %
RV: 2.43 L
TLC % PRED: 102 %
TLC: 4.69 L

## 2015-09-09 DIAGNOSIS — H16223 Keratoconjunctivitis sicca, not specified as Sjogren's, bilateral: Secondary | ICD-10-CM | POA: Diagnosis not present

## 2015-09-09 DIAGNOSIS — H04123 Dry eye syndrome of bilateral lacrimal glands: Secondary | ICD-10-CM | POA: Diagnosis not present

## 2016-01-07 DIAGNOSIS — H16223 Keratoconjunctivitis sicca, not specified as Sjogren's, bilateral: Secondary | ICD-10-CM | POA: Diagnosis not present

## 2016-03-08 DIAGNOSIS — Z1389 Encounter for screening for other disorder: Secondary | ICD-10-CM | POA: Diagnosis not present

## 2016-03-08 DIAGNOSIS — Z6822 Body mass index (BMI) 22.0-22.9, adult: Secondary | ICD-10-CM | POA: Diagnosis not present

## 2016-03-08 DIAGNOSIS — Z0000 Encounter for general adult medical examination without abnormal findings: Secondary | ICD-10-CM | POA: Diagnosis not present

## 2016-03-16 DIAGNOSIS — E441 Mild protein-calorie malnutrition: Secondary | ICD-10-CM | POA: Diagnosis not present

## 2016-03-16 DIAGNOSIS — Z6822 Body mass index (BMI) 22.0-22.9, adult: Secondary | ICD-10-CM | POA: Diagnosis not present

## 2016-03-16 DIAGNOSIS — E039 Hypothyroidism, unspecified: Secondary | ICD-10-CM | POA: Diagnosis not present

## 2016-04-29 DIAGNOSIS — Z6822 Body mass index (BMI) 22.0-22.9, adult: Secondary | ICD-10-CM | POA: Diagnosis not present

## 2016-04-29 DIAGNOSIS — E039 Hypothyroidism, unspecified: Secondary | ICD-10-CM | POA: Diagnosis not present

## 2016-04-29 DIAGNOSIS — Z1389 Encounter for screening for other disorder: Secondary | ICD-10-CM | POA: Diagnosis not present

## 2016-04-29 DIAGNOSIS — M545 Low back pain: Secondary | ICD-10-CM | POA: Diagnosis not present

## 2016-05-25 ENCOUNTER — Other Ambulatory Visit (HOSPITAL_COMMUNITY): Payer: Self-pay | Admitting: Family Medicine

## 2016-05-25 ENCOUNTER — Ambulatory Visit (HOSPITAL_COMMUNITY)
Admission: RE | Admit: 2016-05-25 | Discharge: 2016-05-25 | Disposition: A | Discharge status: Home / Self Care | Payer: Medicare Other | Source: Ambulatory Visit | Attending: Family Medicine | Admitting: Family Medicine

## 2016-05-25 DIAGNOSIS — M545 Low back pain: Secondary | ICD-10-CM

## 2016-05-25 DIAGNOSIS — M5410 Radiculopathy, site unspecified: Secondary | ICD-10-CM | POA: Diagnosis not present

## 2016-05-25 DIAGNOSIS — Z6821 Body mass index (BMI) 21.0-21.9, adult: Secondary | ICD-10-CM | POA: Diagnosis not present

## 2016-05-25 DIAGNOSIS — M5136 Other intervertebral disc degeneration, lumbar region: Secondary | ICD-10-CM | POA: Diagnosis not present

## 2016-05-25 DIAGNOSIS — Z1389 Encounter for screening for other disorder: Secondary | ICD-10-CM | POA: Diagnosis not present

## 2016-05-25 DIAGNOSIS — E559 Vitamin D deficiency, unspecified: Secondary | ICD-10-CM | POA: Diagnosis not present

## 2016-05-25 DIAGNOSIS — M5137 Other intervertebral disc degeneration, lumbosacral region: Secondary | ICD-10-CM | POA: Insufficient documentation

## 2016-05-25 DIAGNOSIS — E538 Deficiency of other specified B group vitamins: Secondary | ICD-10-CM | POA: Diagnosis not present

## 2016-07-22 DIAGNOSIS — M3500 Sicca syndrome, unspecified: Secondary | ICD-10-CM | POA: Diagnosis not present

## 2016-07-22 DIAGNOSIS — I781 Nevus, non-neoplastic: Secondary | ICD-10-CM | POA: Diagnosis not present

## 2016-07-22 DIAGNOSIS — I10 Essential (primary) hypertension: Secondary | ICD-10-CM | POA: Diagnosis not present

## 2016-07-22 DIAGNOSIS — Z7982 Long term (current) use of aspirin: Secondary | ICD-10-CM | POA: Diagnosis not present

## 2016-07-22 DIAGNOSIS — M3501 Sicca syndrome with keratoconjunctivitis: Secondary | ICD-10-CM | POA: Diagnosis not present

## 2016-07-22 DIAGNOSIS — Z79899 Other long term (current) drug therapy: Secondary | ICD-10-CM | POA: Diagnosis not present

## 2016-07-22 DIAGNOSIS — M349 Systemic sclerosis, unspecified: Secondary | ICD-10-CM | POA: Diagnosis not present

## 2016-07-22 DIAGNOSIS — K219 Gastro-esophageal reflux disease without esophagitis: Secondary | ICD-10-CM | POA: Diagnosis not present

## 2016-08-10 DIAGNOSIS — E559 Vitamin D deficiency, unspecified: Secondary | ICD-10-CM | POA: Diagnosis not present

## 2016-08-10 DIAGNOSIS — E782 Mixed hyperlipidemia: Secondary | ICD-10-CM | POA: Diagnosis not present

## 2016-08-10 DIAGNOSIS — I10 Essential (primary) hypertension: Secondary | ICD-10-CM | POA: Diagnosis not present

## 2016-08-10 DIAGNOSIS — Z1389 Encounter for screening for other disorder: Secondary | ICD-10-CM | POA: Diagnosis not present

## 2016-08-10 DIAGNOSIS — Z6822 Body mass index (BMI) 22.0-22.9, adult: Secondary | ICD-10-CM | POA: Diagnosis not present

## 2016-08-10 DIAGNOSIS — I270 Primary pulmonary hypertension: Secondary | ICD-10-CM | POA: Diagnosis not present

## 2017-02-01 DIAGNOSIS — E559 Vitamin D deficiency, unspecified: Secondary | ICD-10-CM | POA: Diagnosis not present

## 2017-02-01 DIAGNOSIS — Z6822 Body mass index (BMI) 22.0-22.9, adult: Secondary | ICD-10-CM | POA: Diagnosis not present

## 2017-02-01 DIAGNOSIS — I270 Primary pulmonary hypertension: Secondary | ICD-10-CM | POA: Diagnosis not present

## 2017-02-01 DIAGNOSIS — E039 Hypothyroidism, unspecified: Secondary | ICD-10-CM | POA: Diagnosis not present

## 2017-02-01 DIAGNOSIS — Z1389 Encounter for screening for other disorder: Secondary | ICD-10-CM | POA: Diagnosis not present

## 2017-02-01 DIAGNOSIS — I10 Essential (primary) hypertension: Secondary | ICD-10-CM | POA: Diagnosis not present

## 2017-03-16 DIAGNOSIS — E039 Hypothyroidism, unspecified: Secondary | ICD-10-CM | POA: Diagnosis not present

## 2017-03-16 DIAGNOSIS — Z6822 Body mass index (BMI) 22.0-22.9, adult: Secondary | ICD-10-CM | POA: Diagnosis not present

## 2017-12-06 DIAGNOSIS — Z6822 Body mass index (BMI) 22.0-22.9, adult: Secondary | ICD-10-CM | POA: Diagnosis not present

## 2017-12-06 DIAGNOSIS — J069 Acute upper respiratory infection, unspecified: Secondary | ICD-10-CM | POA: Diagnosis not present

## 2018-01-03 DIAGNOSIS — R42 Dizziness and giddiness: Secondary | ICD-10-CM | POA: Diagnosis not present

## 2018-01-03 DIAGNOSIS — Z6821 Body mass index (BMI) 21.0-21.9, adult: Secondary | ICD-10-CM | POA: Diagnosis not present

## 2018-01-03 DIAGNOSIS — E039 Hypothyroidism, unspecified: Secondary | ICD-10-CM | POA: Diagnosis not present

## 2018-02-26 DIAGNOSIS — I10 Essential (primary) hypertension: Secondary | ICD-10-CM | POA: Diagnosis not present

## 2018-02-26 DIAGNOSIS — E441 Mild protein-calorie malnutrition: Secondary | ICD-10-CM | POA: Diagnosis not present

## 2018-02-26 DIAGNOSIS — E039 Hypothyroidism, unspecified: Secondary | ICD-10-CM | POA: Diagnosis not present

## 2018-02-26 DIAGNOSIS — Z23 Encounter for immunization: Secondary | ICD-10-CM | POA: Diagnosis not present

## 2018-02-26 DIAGNOSIS — E559 Vitamin D deficiency, unspecified: Secondary | ICD-10-CM | POA: Diagnosis not present

## 2018-02-26 DIAGNOSIS — E782 Mixed hyperlipidemia: Secondary | ICD-10-CM | POA: Diagnosis not present

## 2018-02-26 DIAGNOSIS — I270 Primary pulmonary hypertension: Secondary | ICD-10-CM | POA: Diagnosis not present

## 2018-02-26 DIAGNOSIS — Z6822 Body mass index (BMI) 22.0-22.9, adult: Secondary | ICD-10-CM | POA: Diagnosis not present

## 2018-03-23 ENCOUNTER — Other Ambulatory Visit (HOSPITAL_COMMUNITY): Payer: Self-pay | Admitting: Family Medicine

## 2018-03-23 DIAGNOSIS — Z6822 Body mass index (BMI) 22.0-22.9, adult: Secondary | ICD-10-CM | POA: Diagnosis not present

## 2018-03-23 DIAGNOSIS — M79671 Pain in right foot: Secondary | ICD-10-CM | POA: Diagnosis not present

## 2018-03-23 DIAGNOSIS — Z0001 Encounter for general adult medical examination with abnormal findings: Secondary | ICD-10-CM | POA: Diagnosis not present

## 2018-03-23 DIAGNOSIS — Z1389 Encounter for screening for other disorder: Secondary | ICD-10-CM

## 2018-03-26 ENCOUNTER — Other Ambulatory Visit (HOSPITAL_COMMUNITY): Payer: Self-pay | Admitting: Family Medicine

## 2018-03-26 ENCOUNTER — Ambulatory Visit (HOSPITAL_COMMUNITY)
Admission: RE | Admit: 2018-03-26 | Discharge: 2018-03-26 | Disposition: A | Discharge status: Home / Self Care | Payer: Medicare Other | Source: Ambulatory Visit | Attending: Family Medicine | Admitting: Family Medicine

## 2018-03-26 DIAGNOSIS — M79671 Pain in right foot: Secondary | ICD-10-CM | POA: Diagnosis not present

## 2018-03-26 DIAGNOSIS — R52 Pain, unspecified: Secondary | ICD-10-CM

## 2018-11-14 DIAGNOSIS — Z6822 Body mass index (BMI) 22.0-22.9, adult: Secondary | ICD-10-CM | POA: Diagnosis not present

## 2018-11-14 DIAGNOSIS — Z1389 Encounter for screening for other disorder: Secondary | ICD-10-CM | POA: Diagnosis not present

## 2018-11-14 DIAGNOSIS — E039 Hypothyroidism, unspecified: Secondary | ICD-10-CM | POA: Diagnosis not present

## 2018-11-14 DIAGNOSIS — E559 Vitamin D deficiency, unspecified: Secondary | ICD-10-CM | POA: Diagnosis not present

## 2018-12-26 DIAGNOSIS — Z6821 Body mass index (BMI) 21.0-21.9, adult: Secondary | ICD-10-CM | POA: Diagnosis not present

## 2018-12-26 DIAGNOSIS — J069 Acute upper respiratory infection, unspecified: Secondary | ICD-10-CM | POA: Diagnosis not present

## 2018-12-26 DIAGNOSIS — E039 Hypothyroidism, unspecified: Secondary | ICD-10-CM | POA: Diagnosis not present

## 2019-02-04 DIAGNOSIS — E039 Hypothyroidism, unspecified: Secondary | ICD-10-CM | POA: Diagnosis not present

## 2019-02-05 DIAGNOSIS — M25531 Pain in right wrist: Secondary | ICD-10-CM | POA: Diagnosis not present

## 2019-02-05 DIAGNOSIS — M1991 Primary osteoarthritis, unspecified site: Secondary | ICD-10-CM | POA: Diagnosis not present

## 2019-02-05 DIAGNOSIS — Z6820 Body mass index (BMI) 20.0-20.9, adult: Secondary | ICD-10-CM | POA: Diagnosis not present

## 2019-02-27 DIAGNOSIS — E039 Hypothyroidism, unspecified: Secondary | ICD-10-CM | POA: Diagnosis not present

## 2019-02-27 DIAGNOSIS — R5383 Other fatigue: Secondary | ICD-10-CM | POA: Diagnosis not present

## 2019-02-27 DIAGNOSIS — E441 Mild protein-calorie malnutrition: Secondary | ICD-10-CM | POA: Diagnosis not present

## 2019-02-27 DIAGNOSIS — E559 Vitamin D deficiency, unspecified: Secondary | ICD-10-CM | POA: Diagnosis not present

## 2019-02-27 DIAGNOSIS — E538 Deficiency of other specified B group vitamins: Secondary | ICD-10-CM | POA: Diagnosis not present

## 2019-02-27 DIAGNOSIS — Z681 Body mass index (BMI) 19 or less, adult: Secondary | ICD-10-CM | POA: Diagnosis not present

## 2019-03-04 DIAGNOSIS — M1991 Primary osteoarthritis, unspecified site: Secondary | ICD-10-CM | POA: Diagnosis not present

## 2019-03-04 DIAGNOSIS — M6281 Muscle weakness (generalized): Secondary | ICD-10-CM | POA: Diagnosis not present

## 2019-03-04 DIAGNOSIS — M25531 Pain in right wrist: Secondary | ICD-10-CM | POA: Diagnosis not present

## 2019-03-04 DIAGNOSIS — M25541 Pain in joints of right hand: Secondary | ICD-10-CM | POA: Diagnosis not present

## 2019-03-08 DIAGNOSIS — M6281 Muscle weakness (generalized): Secondary | ICD-10-CM | POA: Diagnosis not present

## 2019-03-08 DIAGNOSIS — M1991 Primary osteoarthritis, unspecified site: Secondary | ICD-10-CM | POA: Diagnosis not present

## 2019-03-08 DIAGNOSIS — M25541 Pain in joints of right hand: Secondary | ICD-10-CM | POA: Diagnosis not present

## 2019-03-08 DIAGNOSIS — M25531 Pain in right wrist: Secondary | ICD-10-CM | POA: Diagnosis not present

## 2019-03-13 DIAGNOSIS — M25531 Pain in right wrist: Secondary | ICD-10-CM | POA: Diagnosis not present

## 2019-03-13 DIAGNOSIS — M25541 Pain in joints of right hand: Secondary | ICD-10-CM | POA: Diagnosis not present

## 2019-03-13 DIAGNOSIS — M1991 Primary osteoarthritis, unspecified site: Secondary | ICD-10-CM | POA: Diagnosis not present

## 2019-03-13 DIAGNOSIS — M6281 Muscle weakness (generalized): Secondary | ICD-10-CM | POA: Diagnosis not present

## 2019-03-18 DIAGNOSIS — M1991 Primary osteoarthritis, unspecified site: Secondary | ICD-10-CM | POA: Diagnosis not present

## 2019-03-18 DIAGNOSIS — M6281 Muscle weakness (generalized): Secondary | ICD-10-CM | POA: Diagnosis not present

## 2019-03-18 DIAGNOSIS — M25541 Pain in joints of right hand: Secondary | ICD-10-CM | POA: Diagnosis not present

## 2019-03-18 DIAGNOSIS — M25531 Pain in right wrist: Secondary | ICD-10-CM | POA: Diagnosis not present

## 2019-03-22 DIAGNOSIS — M6281 Muscle weakness (generalized): Secondary | ICD-10-CM | POA: Diagnosis not present

## 2019-03-22 DIAGNOSIS — M1991 Primary osteoarthritis, unspecified site: Secondary | ICD-10-CM | POA: Diagnosis not present

## 2019-03-22 DIAGNOSIS — M25531 Pain in right wrist: Secondary | ICD-10-CM | POA: Diagnosis not present

## 2019-03-22 DIAGNOSIS — M25541 Pain in joints of right hand: Secondary | ICD-10-CM | POA: Diagnosis not present

## 2019-03-25 DIAGNOSIS — M25531 Pain in right wrist: Secondary | ICD-10-CM | POA: Diagnosis not present

## 2019-03-25 DIAGNOSIS — M25541 Pain in joints of right hand: Secondary | ICD-10-CM | POA: Diagnosis not present

## 2019-03-25 DIAGNOSIS — M1991 Primary osteoarthritis, unspecified site: Secondary | ICD-10-CM | POA: Diagnosis not present

## 2019-03-25 DIAGNOSIS — M6281 Muscle weakness (generalized): Secondary | ICD-10-CM | POA: Diagnosis not present

## 2019-04-02 DIAGNOSIS — Z681 Body mass index (BMI) 19 or less, adult: Secondary | ICD-10-CM | POA: Diagnosis not present

## 2019-04-02 DIAGNOSIS — Z1389 Encounter for screening for other disorder: Secondary | ICD-10-CM | POA: Diagnosis not present

## 2019-04-02 DIAGNOSIS — D696 Thrombocytopenia, unspecified: Secondary | ICD-10-CM | POA: Diagnosis not present

## 2019-04-02 DIAGNOSIS — Z0001 Encounter for general adult medical examination with abnormal findings: Secondary | ICD-10-CM | POA: Diagnosis not present

## 2019-04-02 DIAGNOSIS — E039 Hypothyroidism, unspecified: Secondary | ICD-10-CM | POA: Diagnosis not present

## 2019-04-11 ENCOUNTER — Encounter (INDEPENDENT_AMBULATORY_CARE_PROVIDER_SITE_OTHER): Payer: Self-pay | Admitting: Internal Medicine

## 2019-04-11 ENCOUNTER — Ambulatory Visit (INDEPENDENT_AMBULATORY_CARE_PROVIDER_SITE_OTHER): Payer: Medicare Other | Admitting: Internal Medicine

## 2019-04-11 ENCOUNTER — Other Ambulatory Visit: Payer: Self-pay

## 2019-04-11 VITALS — BP 178/89 | HR 88 | Temp 98.0°F | Ht 60.0 in | Wt 99.2 lb

## 2019-04-11 DIAGNOSIS — R197 Diarrhea, unspecified: Secondary | ICD-10-CM

## 2019-04-11 NOTE — Patient Instructions (Signed)
GI pathogen   

## 2019-04-11 NOTE — Progress Notes (Signed)
   Subjective:    Patient ID: [name], female    DOB: 02-15-1933, 85 y.o.   MRN: [id number] (Did not bring medications) HPI Referred by Dr. Hilma Favors for diarrhea. She has had diarrhea for greater than 3 yrs. Diarrhea off an on. She says she was given Lomotil and it helped with the diarrhea.  She does have normal stools in between. On average, she has 3 stools a days. Sometimes she may have six stools. Some days she may not have one. She has not been on any recent antibiotics. She has never had a colonoscopy nor does she want one.  No family hx of colon cancer.  Her appetite is good. She tells me now, that in the last month, her diarrhea is better. Last week she did not have diarrhea at all.  No certain foods cause Milk does cause diarrhea.  Takes Stanback headache twice a day  Review of Systems History reviewed. No pertinent past medical history.    Allergies  Allergen Reactions  . Ciprocin-Fluocin-Procin [Fluocinolone Acetonide]   . Codeine   . Demerol   . Penicillins   . Sulfa Antibiotics     Current Outpatient Medications on File Prior to Visit  Medication Sig Dispense Refill  . CALCIUM MAGNESIUM 750 PO Take by mouth.    . Cholecalciferol (VITAMIN D3) 10 MCG (400 UNIT) CAPS Take 1,000 mg by mouth.    . co-enzyme Q-10 30 MG capsule Take 30 mg by mouth 3 (three) times daily.    Marland Kitchen esomeprazole (NEXIUM) 20 MG capsule Take 20 mg by mouth daily at 12 noon.    . sertraline (ZOLOFT) 25 MG tablet Take 25 mg by mouth daily.    Marland Kitchen thyroid (ARMOUR) 120 MG tablet Take 120 mg by mouth daily before breakfast. 120 one day and 90mg  the next day.    . Turmeric 400 MG CAPS Take 800 mg by mouth.     No current facility-administered medications on file prior to visit.         Objective:   Physical Exam Blood pressure (!) 178/89, pulse 88, temperature 98 F (36.7 C), height 5' (1.524 m), weight 99 lb 3.2 oz (45 kg). Alert and oriented. Skin warm and dry. Oral mucosa is moist.   .  Sclera anicteric, conjunctivae is pink. Thyroid not enlarged. No cervical lymphadenopathy. Lungs clear. Heart regular rate and rhythm.  Abdomen is soft. Bowel sounds are positive. No hepatomegaly. No abdominal masses felt. No tenderness.  No edema to lower extremities.          Assessment & Plan:  Chronic diarrhea. She can Imodium twice a day for the diarrhea. GI pathogen.

## 2019-04-18 ENCOUNTER — Telehealth (INDEPENDENT_AMBULATORY_CARE_PROVIDER_SITE_OTHER): Payer: Self-pay | Admitting: Internal Medicine

## 2019-04-18 DIAGNOSIS — R197 Diarrhea, unspecified: Secondary | ICD-10-CM | POA: Diagnosis not present

## 2019-04-18 NOTE — Telephone Encounter (Signed)
[facility] called stated patients daughter dropped off stool sample on 7/1 with only the patients name no birth date - they had to search for patient in the system - stated that sample may come back and not able to use because they had to put it in the fridge - may have to do over -  Stated to please let patients know they have to put name and date of birth on sample

## 2019-04-22 LAB — GASTROINTESTINAL PATHOGEN PANEL PCR
C. difficile Tox A/B, PCR: NOT DETECTED
Campylobacter, PCR: NOT DETECTED
Cryptosporidium, PCR: NOT DETECTED
E coli (ETEC) LT/ST PCR: NOT DETECTED
E coli (STEC) stx1/stx2, PCR: NOT DETECTED
E coli 0157, PCR: NOT DETECTED
Giardia lamblia, PCR: NOT DETECTED
Norovirus, PCR: NOT DETECTED
Rotavirus A, PCR: NOT DETECTED
Salmonella, PCR: NOT DETECTED
Shigella, PCR: NOT DETECTED

## 2019-04-22 NOTE — Telephone Encounter (Signed)
Forwarded to Terri to address. 

## 2019-04-22 NOTE — Telephone Encounter (Signed)
noted 

## 2019-05-16 DIAGNOSIS — E039 Hypothyroidism, unspecified: Secondary | ICD-10-CM | POA: Diagnosis not present

## 2019-05-20 ENCOUNTER — Telehealth (INDEPENDENT_AMBULATORY_CARE_PROVIDER_SITE_OTHER): Payer: Self-pay | Admitting: Nurse Practitioner

## 2019-05-20 NOTE — Telephone Encounter (Signed)
I called patient, she reports having loose stools for years. GI panel as ordered by Karna Christmas NP was negative. She denies having any abdominal pain. No bloody diarrhea. She typically avoids dairy products.  She is taking Imodium 1 tab bid with some improvement, however, last night she awakened and passed 2 solid stools then 2 to 3 looser stools which interfered with her sleep. I advised she can continue Imodium 1 bid, add Benefiber 1 tablespoon daily which bulks up the stool and will potentially resolve her loose stool with negative GI panel, less likely infectious diarrhea. She will call me in 1 to 2 weeks with an update, sooner if her sx worsen. Advised patient to drink at least 6 glasses of water daily.

## 2019-05-20 NOTE — Telephone Encounter (Signed)
Patient left message stating she would like to talk with someone about her continued care - ph# [phone]

## 2019-07-01 DIAGNOSIS — E039 Hypothyroidism, unspecified: Secondary | ICD-10-CM | POA: Diagnosis not present

## 2019-07-02 DIAGNOSIS — Z681 Body mass index (BMI) 19 or less, adult: Secondary | ICD-10-CM | POA: Diagnosis not present

## 2019-07-02 DIAGNOSIS — E538 Deficiency of other specified B group vitamins: Secondary | ICD-10-CM | POA: Diagnosis not present

## 2019-07-02 DIAGNOSIS — M189 Osteoarthritis of first carpometacarpal joint, unspecified: Secondary | ICD-10-CM | POA: Diagnosis not present

## 2019-07-02 DIAGNOSIS — K529 Noninfective gastroenteritis and colitis, unspecified: Secondary | ICD-10-CM | POA: Diagnosis not present

## 2019-07-02 DIAGNOSIS — E559 Vitamin D deficiency, unspecified: Secondary | ICD-10-CM | POA: Diagnosis not present

## 2019-07-02 DIAGNOSIS — I270 Primary pulmonary hypertension: Secondary | ICD-10-CM | POA: Diagnosis not present

## 2019-07-02 DIAGNOSIS — E039 Hypothyroidism, unspecified: Secondary | ICD-10-CM | POA: Diagnosis not present

## 2019-07-02 DIAGNOSIS — E782 Mixed hyperlipidemia: Secondary | ICD-10-CM | POA: Diagnosis not present

## 2019-07-02 DIAGNOSIS — L409 Psoriasis, unspecified: Secondary | ICD-10-CM | POA: Diagnosis not present

## 2019-07-02 DIAGNOSIS — I10 Essential (primary) hypertension: Secondary | ICD-10-CM | POA: Diagnosis not present

## 2019-07-02 DIAGNOSIS — R5383 Other fatigue: Secondary | ICD-10-CM | POA: Diagnosis not present

## 2019-07-10 ENCOUNTER — Ambulatory Visit: Payer: Self-pay | Admitting: "Endocrinology

## 2019-07-29 DIAGNOSIS — M189 Osteoarthritis of first carpometacarpal joint, unspecified: Secondary | ICD-10-CM | POA: Diagnosis not present

## 2019-07-29 DIAGNOSIS — Z681 Body mass index (BMI) 19 or less, adult: Secondary | ICD-10-CM | POA: Diagnosis not present

## 2019-07-29 DIAGNOSIS — I270 Primary pulmonary hypertension: Secondary | ICD-10-CM | POA: Diagnosis not present

## 2019-07-29 DIAGNOSIS — K529 Noninfective gastroenteritis and colitis, unspecified: Secondary | ICD-10-CM | POA: Diagnosis not present

## 2019-08-02 DIAGNOSIS — D509 Iron deficiency anemia, unspecified: Secondary | ICD-10-CM | POA: Diagnosis not present

## 2019-08-02 DIAGNOSIS — D638 Anemia in other chronic diseases classified elsewhere: Secondary | ICD-10-CM | POA: Diagnosis not present

## 2019-08-02 DIAGNOSIS — Z6821 Body mass index (BMI) 21.0-21.9, adult: Secondary | ICD-10-CM | POA: Diagnosis not present

## 2019-08-02 DIAGNOSIS — R945 Abnormal results of liver function studies: Secondary | ICD-10-CM | POA: Diagnosis not present

## 2019-08-27 ENCOUNTER — Emergency Department (HOSPITAL_COMMUNITY): Payer: Medicare Other

## 2019-08-27 ENCOUNTER — Inpatient Hospital Stay (HOSPITAL_COMMUNITY)
Admission: EM | Admit: 2019-08-27 | Discharge: 2019-09-01 | DRG: 378 | Disposition: A | Discharge status: Home / Self Care | Payer: Medicare Other | Attending: Internal Medicine | Admitting: Internal Medicine

## 2019-08-27 ENCOUNTER — Inpatient Hospital Stay (HOSPITAL_COMMUNITY): Payer: Medicare Other

## 2019-08-27 ENCOUNTER — Other Ambulatory Visit: Payer: Self-pay

## 2019-08-27 ENCOUNTER — Encounter (HOSPITAL_COMMUNITY): Payer: Self-pay | Admitting: *Deleted

## 2019-08-27 DIAGNOSIS — R1084 Generalized abdominal pain: Secondary | ICD-10-CM

## 2019-08-27 DIAGNOSIS — Z888 Allergy status to other drugs, medicaments and biological substances status: Secondary | ICD-10-CM

## 2019-08-27 DIAGNOSIS — K7460 Unspecified cirrhosis of liver: Secondary | ICD-10-CM | POA: Diagnosis present

## 2019-08-27 DIAGNOSIS — W1811XA Fall from or off toilet without subsequent striking against object, initial encounter: Secondary | ICD-10-CM | POA: Diagnosis present

## 2019-08-27 DIAGNOSIS — Z881 Allergy status to other antibiotic agents status: Secondary | ICD-10-CM

## 2019-08-27 DIAGNOSIS — E876 Hypokalemia: Secondary | ICD-10-CM | POA: Diagnosis present

## 2019-08-27 DIAGNOSIS — Z8249 Family history of ischemic heart disease and other diseases of the circulatory system: Secondary | ICD-10-CM

## 2019-08-27 DIAGNOSIS — K3189 Other diseases of stomach and duodenum: Secondary | ICD-10-CM | POA: Diagnosis not present

## 2019-08-27 DIAGNOSIS — R188 Other ascites: Secondary | ICD-10-CM | POA: Diagnosis not present

## 2019-08-27 DIAGNOSIS — R519 Headache, unspecified: Secondary | ICD-10-CM | POA: Diagnosis present

## 2019-08-27 DIAGNOSIS — E039 Hypothyroidism, unspecified: Secondary | ICD-10-CM | POA: Diagnosis present

## 2019-08-27 DIAGNOSIS — Z880 Allergy status to penicillin: Secondary | ICD-10-CM

## 2019-08-27 DIAGNOSIS — K219 Gastro-esophageal reflux disease without esophagitis: Secondary | ICD-10-CM | POA: Diagnosis present

## 2019-08-27 DIAGNOSIS — R0602 Shortness of breath: Secondary | ICD-10-CM | POA: Diagnosis not present

## 2019-08-27 DIAGNOSIS — R55 Syncope and collapse: Secondary | ICD-10-CM | POA: Diagnosis present

## 2019-08-27 DIAGNOSIS — I517 Cardiomegaly: Secondary | ICD-10-CM | POA: Diagnosis not present

## 2019-08-27 DIAGNOSIS — R197 Diarrhea, unspecified: Secondary | ICD-10-CM | POA: Diagnosis present

## 2019-08-27 DIAGNOSIS — K5520 Angiodysplasia of colon without hemorrhage: Secondary | ICD-10-CM | POA: Diagnosis present

## 2019-08-27 DIAGNOSIS — K2270 Barrett's esophagus without dysplasia: Secondary | ICD-10-CM | POA: Diagnosis present

## 2019-08-27 DIAGNOSIS — R531 Weakness: Secondary | ICD-10-CM | POA: Diagnosis not present

## 2019-08-27 DIAGNOSIS — I340 Nonrheumatic mitral (valve) insufficiency: Secondary | ICD-10-CM | POA: Diagnosis not present

## 2019-08-27 DIAGNOSIS — R109 Unspecified abdominal pain: Secondary | ICD-10-CM

## 2019-08-27 DIAGNOSIS — K319 Disease of stomach and duodenum, unspecified: Secondary | ICD-10-CM | POA: Diagnosis not present

## 2019-08-27 DIAGNOSIS — K766 Portal hypertension: Secondary | ICD-10-CM | POA: Diagnosis present

## 2019-08-27 DIAGNOSIS — R000 Tachycardia, unspecified: Secondary | ICD-10-CM | POA: Diagnosis not present

## 2019-08-27 DIAGNOSIS — D62 Acute posthemorrhagic anemia: Secondary | ICD-10-CM | POA: Diagnosis present

## 2019-08-27 DIAGNOSIS — K644 Residual hemorrhoidal skin tags: Secondary | ICD-10-CM | POA: Diagnosis not present

## 2019-08-27 DIAGNOSIS — K5731 Diverticulosis of large intestine without perforation or abscess with bleeding: Secondary | ICD-10-CM | POA: Diagnosis not present

## 2019-08-27 DIAGNOSIS — K922 Gastrointestinal hemorrhage, unspecified: Secondary | ICD-10-CM | POA: Diagnosis present

## 2019-08-27 DIAGNOSIS — Z885 Allergy status to narcotic agent status: Secondary | ICD-10-CM

## 2019-08-27 DIAGNOSIS — I8510 Secondary esophageal varices without bleeding: Secondary | ICD-10-CM | POA: Diagnosis present

## 2019-08-27 DIAGNOSIS — K228 Other specified diseases of esophagus: Secondary | ICD-10-CM | POA: Diagnosis not present

## 2019-08-27 DIAGNOSIS — Z20828 Contact with and (suspected) exposure to other viral communicable diseases: Secondary | ICD-10-CM | POA: Diagnosis present

## 2019-08-27 DIAGNOSIS — R58 Hemorrhage, not elsewhere classified: Secondary | ICD-10-CM | POA: Diagnosis not present

## 2019-08-27 DIAGNOSIS — Z7982 Long term (current) use of aspirin: Secondary | ICD-10-CM

## 2019-08-27 DIAGNOSIS — K449 Diaphragmatic hernia without obstruction or gangrene: Secondary | ICD-10-CM | POA: Diagnosis not present

## 2019-08-27 DIAGNOSIS — K529 Noninfective gastroenteritis and colitis, unspecified: Secondary | ICD-10-CM | POA: Diagnosis not present

## 2019-08-27 DIAGNOSIS — Z882 Allergy status to sulfonamides status: Secondary | ICD-10-CM

## 2019-08-27 DIAGNOSIS — R0600 Dyspnea, unspecified: Secondary | ICD-10-CM

## 2019-08-27 DIAGNOSIS — R0902 Hypoxemia: Secondary | ICD-10-CM | POA: Diagnosis not present

## 2019-08-27 DIAGNOSIS — K625 Hemorrhage of anus and rectum: Secondary | ICD-10-CM | POA: Diagnosis not present

## 2019-08-27 DIAGNOSIS — D509 Iron deficiency anemia, unspecified: Secondary | ICD-10-CM | POA: Diagnosis present

## 2019-08-27 DIAGNOSIS — D649 Anemia, unspecified: Secondary | ICD-10-CM | POA: Diagnosis present

## 2019-08-27 DIAGNOSIS — Z79899 Other long term (current) drug therapy: Secondary | ICD-10-CM | POA: Diagnosis not present

## 2019-08-27 DIAGNOSIS — M3500 Sicca syndrome, unspecified: Secondary | ICD-10-CM | POA: Diagnosis present

## 2019-08-27 DIAGNOSIS — K7031 Alcoholic cirrhosis of liver with ascites: Secondary | ICD-10-CM | POA: Diagnosis not present

## 2019-08-27 DIAGNOSIS — R0689 Other abnormalities of breathing: Secondary | ICD-10-CM | POA: Diagnosis not present

## 2019-08-27 DIAGNOSIS — S0990XA Unspecified injury of head, initial encounter: Secondary | ICD-10-CM | POA: Diagnosis not present

## 2019-08-27 DIAGNOSIS — S199XXA Unspecified injury of neck, initial encounter: Secondary | ICD-10-CM | POA: Diagnosis not present

## 2019-08-27 DIAGNOSIS — S0081XA Abrasion of other part of head, initial encounter: Secondary | ICD-10-CM | POA: Diagnosis present

## 2019-08-27 DIAGNOSIS — K5730 Diverticulosis of large intestine without perforation or abscess without bleeding: Secondary | ICD-10-CM | POA: Diagnosis not present

## 2019-08-27 DIAGNOSIS — F329 Major depressive disorder, single episode, unspecified: Secondary | ICD-10-CM | POA: Diagnosis present

## 2019-08-27 DIAGNOSIS — I361 Nonrheumatic tricuspid (valve) insufficiency: Secondary | ICD-10-CM | POA: Diagnosis not present

## 2019-08-27 DIAGNOSIS — Z90710 Acquired absence of both cervix and uterus: Secondary | ICD-10-CM

## 2019-08-27 DIAGNOSIS — I509 Heart failure, unspecified: Secondary | ICD-10-CM | POA: Diagnosis present

## 2019-08-27 DIAGNOSIS — K6289 Other specified diseases of anus and rectum: Secondary | ICD-10-CM | POA: Diagnosis not present

## 2019-08-27 HISTORY — DX: Disorder of thyroid, unspecified: E07.9

## 2019-08-27 LAB — COMPREHENSIVE METABOLIC PANEL
ALT: 16 U/L (ref 0–44)
AST: 31 U/L (ref 15–41)
Albumin: 2.5 g/dL — ABNORMAL LOW (ref 3.5–5.0)
Alkaline Phosphatase: 50 U/L (ref 38–126)
Anion gap: 8 (ref 5–15)
BUN: 11 mg/dL (ref 8–23)
CO2: 23 mmol/L (ref 22–32)
Calcium: 8.1 mg/dL — ABNORMAL LOW (ref 8.9–10.3)
Chloride: 108 mmol/L (ref 98–111)
Creatinine, Ser: 0.55 mg/dL (ref 0.44–1.00)
GFR calc Af Amer: >60 mL/min (ref >60)
GFR calc non Af Amer: >60 mL/min (ref >60)
Glucose, Bld: 118 mg/dL — ABNORMAL HIGH (ref 70–99)
Potassium: 3.3 mmol/L — ABNORMAL LOW (ref 3.5–5.1)
Sodium: 139 mmol/L (ref 135–145)
Total Bilirubin: 0.8 mg/dL (ref 0.3–1.2)
Total Protein: 4.8 g/dL — ABNORMAL LOW (ref 6.5–8.1)

## 2019-08-27 LAB — CBC
HCT: 27.4 % — ABNORMAL LOW (ref 36.0–46.0)
Hemoglobin: 8.8 g/dL — ABNORMAL LOW (ref 12.0–15.0)
MCH: 30 pg (ref 26.0–34.0)
MCHC: 32.1 g/dL (ref 30.0–36.0)
MCV: 93.5 fL (ref 80.0–100.0)
Platelets: 131 10*3/uL — ABNORMAL LOW (ref 150–400)
RBC: 2.93 MIL/uL — ABNORMAL LOW (ref 3.87–5.11)
RDW: 19.5 % — ABNORMAL HIGH (ref 11.5–15.5)
WBC: 10.7 10*3/uL — ABNORMAL HIGH (ref 4.0–10.5)
nRBC: 0 % (ref 0.0–0.2)

## 2019-08-27 LAB — POC OCCULT BLOOD, ED: Fecal Occult Bld: POSITIVE — AB

## 2019-08-27 LAB — CBC WITH DIFFERENTIAL/PLATELET
Abs Immature Granulocytes: 0.04 10*3/uL (ref 0.00–0.07)
Basophils Absolute: 0 10*3/uL (ref 0.0–0.1)
Basophils Relative: 0 %
Eosinophils Absolute: 0 10*3/uL (ref 0.0–0.5)
Eosinophils Relative: 0 %
HCT: 17.9 % — ABNORMAL LOW (ref 36.0–46.0)
Hemoglobin: 5.3 g/dL — CL (ref 12.0–15.0)
Immature Granulocytes: 1 %
Lymphocytes Relative: 11 %
Lymphs Abs: 0.8 10*3/uL (ref 0.7–4.0)
MCH: 28.3 pg (ref 26.0–34.0)
MCHC: 29.6 g/dL — ABNORMAL LOW (ref 30.0–36.0)
MCV: 95.7 fL (ref 80.0–100.0)
Monocytes Absolute: 0.5 10*3/uL (ref 0.1–1.0)
Monocytes Relative: 7 %
Neutro Abs: 6 10*3/uL (ref 1.7–7.7)
Neutrophils Relative %: 81 %
Platelets: 166 10*3/uL (ref 150–400)
RBC: 1.87 MIL/uL — ABNORMAL LOW (ref 3.87–5.11)
WBC: 7.3 10*3/uL (ref 4.0–10.5)
nRBC: 0 % (ref 0.0–0.2)

## 2019-08-27 LAB — PREPARE RBC (CROSSMATCH)

## 2019-08-27 LAB — PROTIME-INR
INR: 1.4 — ABNORMAL HIGH (ref 0.8–1.2)
Prothrombin Time: 16.5 seconds — ABNORMAL HIGH (ref 11.4–15.2)

## 2019-08-27 LAB — CK: Total CK: 57 U/L (ref 38–234)

## 2019-08-27 LAB — ABO/RH: ABO/RH(D): A POS

## 2019-08-27 LAB — TSH: TSH: 16.487 u[IU]/mL — ABNORMAL HIGH (ref 0.350–4.500)

## 2019-08-27 MED ORDER — SODIUM CHLORIDE 0.9 % IV SOLN: INTRAVENOUS | Status: DC

## 2019-08-27 MED ORDER — ACETAMINOPHEN 650 MG RE SUPP: 650.0000 mg | Freq: Four times a day (QID) | RECTAL | Status: DC | PRN

## 2019-08-27 MED ORDER — IOHEXOL 300 MG/ML  SOLN
100.0000 mL | Freq: Once | INTRAMUSCULAR | Status: AC | PRN
  Administered 2019-08-27: 14:00:00 100 mL via INTRAVENOUS

## 2019-08-27 MED ORDER — PANTOPRAZOLE SODIUM 40 MG IV SOLR
40.0000 mg | Freq: Two times a day (BID) | INTRAVENOUS | Status: DC
  Administered 2019-08-27 – 2019-08-31 (×8): 40 mg via INTRAVENOUS
  Filled 2019-08-27 (×8): qty 40

## 2019-08-27 MED ORDER — LORAZEPAM 2 MG/ML IJ SOLN: 0.5000 mg | Freq: Four times a day (QID) | INTRAMUSCULAR | Status: DC | PRN

## 2019-08-27 MED ORDER — CHLORHEXIDINE GLUCONATE CLOTH 2 % EX PADS
6.0000 | MEDICATED_PAD | Freq: Every day | CUTANEOUS | Status: DC
  Administered 2019-08-28 – 2019-09-01 (×5): 6 via TOPICAL

## 2019-08-27 MED ORDER — LOPERAMIDE HCL 2 MG PO CAPS: 2.0000 mg | ORAL_CAPSULE | ORAL | Status: DC | PRN

## 2019-08-27 MED ORDER — SODIUM CHLORIDE 0.9% FLUSH
3.0000 mL | Freq: Two times a day (BID) | INTRAVENOUS | Status: DC
  Administered 2019-08-27 – 2019-09-01 (×10): 3 mL via INTRAVENOUS

## 2019-08-27 MED ORDER — ACETAMINOPHEN 325 MG PO TABS
650.0000 mg | ORAL_TABLET | Freq: Four times a day (QID) | ORAL | Status: DC | PRN
  Administered 2019-08-28: 650 mg via ORAL
  Filled 2019-08-27: qty 2

## 2019-08-27 MED ORDER — THYROID 30 MG PO TABS
120.0000 mg | ORAL_TABLET | ORAL | Status: DC
  Filled 2019-08-27: qty 1
  Filled 2019-08-27: qty 4

## 2019-08-27 MED ORDER — ONDANSETRON HCL 4 MG PO TABS: 4.0000 mg | ORAL_TABLET | Freq: Four times a day (QID) | ORAL | Status: DC | PRN

## 2019-08-27 MED ORDER — THYROID 60 MG PO TABS
90.0000 mg | ORAL_TABLET | ORAL | Status: DC
  Administered 2019-08-29: 90 mg via ORAL
  Filled 2019-08-27: qty 1

## 2019-08-27 MED ORDER — ONDANSETRON HCL 4 MG/2ML IJ SOLN: 4.0000 mg | Freq: Four times a day (QID) | INTRAMUSCULAR | Status: DC | PRN

## 2019-08-27 MED ORDER — FUROSEMIDE 40 MG PO TABS
40.0000 mg | ORAL_TABLET | Freq: Once | ORAL | Status: AC
  Administered 2019-08-27: 15:00:00 40 mg via ORAL
  Filled 2019-08-27: qty 1

## 2019-08-27 MED ORDER — THYROID 30 MG PO TABS
120.0000 mg | ORAL_TABLET | Freq: Every day | ORAL | Status: DC
  Filled 2019-08-27: qty 4

## 2019-08-27 MED ORDER — POLYETHYLENE GLYCOL 3350 17 G PO PACK: 17.0000 g | PACK | Freq: Every day | ORAL | Status: DC | PRN

## 2019-08-27 MED ORDER — SERTRALINE HCL 50 MG PO TABS
25.0000 mg | ORAL_TABLET | Freq: Every day | ORAL | Status: DC
  Administered 2019-08-27 – 2019-09-01 (×6): 25 mg via ORAL
  Filled 2019-08-27 (×6): qty 1

## 2019-08-27 MED ORDER — SODIUM CHLORIDE 0.9 % IV SOLN: 250.0000 mL | INTRAVENOUS | Status: DC | PRN

## 2019-08-27 MED ORDER — DEXTROSE-NACL 5-0.45 % IV SOLN
INTRAVENOUS | Status: DC
  Administered 2019-08-27 – 2019-08-30 (×5): via INTRAVENOUS

## 2019-08-27 MED ORDER — SODIUM CHLORIDE 0.9 % IV BOLUS
1000.0000 mL | Freq: Once | INTRAVENOUS | Status: AC
  Administered 2019-08-27: 10:00:00 1000 mL via INTRAVENOUS

## 2019-08-27 MED ORDER — ALBUTEROL SULFATE (2.5 MG/3ML) 0.083% IN NEBU: 2.5000 mg | INHALATION_SOLUTION | RESPIRATORY_TRACT | Status: DC | PRN

## 2019-08-27 MED ORDER — PROPRANOLOL HCL 20 MG PO TABS
20.0000 mg | ORAL_TABLET | Freq: Two times a day (BID) | ORAL | Status: DC
  Administered 2019-08-27 – 2019-08-28 (×2): 20 mg via ORAL
  Filled 2019-08-27: qty 1
  Filled 2019-08-27: qty 2
  Filled 2019-08-27 (×2): qty 1

## 2019-08-27 MED ORDER — SODIUM CHLORIDE 0.9 % IV SOLN
10.0000 mL/h | Freq: Once | INTRAVENOUS | Status: AC
  Administered 2019-08-27: 13:00:00 10 mL/h via INTRAVENOUS

## 2019-08-27 MED ORDER — SERTRALINE HCL 50 MG PO TABS: 25.0000 mg | ORAL_TABLET | Freq: Every day | ORAL | Status: DC

## 2019-08-27 MED ORDER — THYROID 30 MG PO TABS
120.0000 mg | ORAL_TABLET | Freq: Once | ORAL | Status: AC
  Administered 2019-08-27: 13:00:00 120 mg via ORAL

## 2019-08-27 MED ORDER — SODIUM CHLORIDE 0.9% FLUSH: 3.0000 mL | INTRAVENOUS | Status: DC | PRN

## 2019-08-27 MED ORDER — TRAZODONE HCL 50 MG PO TABS
50.0000 mg | ORAL_TABLET | Freq: Every evening | ORAL | Status: DC | PRN
  Administered 2019-08-28: 50 mg via ORAL
  Filled 2019-08-27 (×2): qty 1

## 2019-08-27 NOTE — ED Provider Notes (Signed)
[facility] EMERGENCY DEPARTMENT Provider Note   CSN: [id number] Arrival date & time: 08/27/19  0093     History   Chief Complaint Chief Complaint  Patient presents with  . Rectal Bleeding    HPI [name] is a 85 y.o. female.     HPI   [name] is a 85 y.o. female with past medical history of chronic diarrhea, hypothyroidism and Sjogren's.  She presents to the Emergency Department after a syncopal episode that occurred this morning.  Patient reports multiple episodes of loose stools yesterday and last evening.  She states she got up around 630 this morning to go to the restroom and noticed the commode was full of bright red blood.  She states she was too weak to get up from the commode and eventually fainted falling off the commode.  Patient's daughter states that she spoke with her at 630 this morning and she arrived at her mother's house around 7 and found her lying in the floor.  Daughter states she was awake and complaining of being weak and cold.  Daughter's husband helped her to the bed and EMS was contacted.  Daughter states that she has chronic loose stools and she takes Imodium twice daily.  Patient states she has intermittent bright red stools since last week.  She does admit to taking Stanback intermittently for headaches.  She also complains of mid abdominal pain and bloating sensation.  She denies vomiting, hematochezia, fever, chills, dysuria, neck hip or back pain.    Past Medical History:  Diagnosis Date  . Thyroid disease    hypothyroidism    Patient Active Problem List   Diagnosis Date Noted  . Diarrhea 04/11/2019       OB History   No obstetric history on file.      Home Medications    Prior to Admission medications   Medication Sig Start Date End Date Taking? Authorizing Provider  Aspirin (STANBACK HEADACHE POWDER PO) Take by mouth 2 (two) times a day.    [provider]  CALCIUM MAGNESIUM 750 PO Take by mouth.     [provider]  Cholecalciferol (VITAMIN D3) 10 MCG (400 UNIT) CAPS Take 1,000 mg by mouth.    [provider]  co-enzyme Q-10 30 MG capsule Take 30 mg by mouth 3 (three) times daily.    [provider]  esomeprazole (NEXIUM) 20 MG capsule Take 20 mg by mouth daily at 12 noon.    [provider]  sertraline (ZOLOFT) 25 MG tablet Take 25 mg by mouth daily.    [provider]  thyroid (ARMOUR) 120 MG tablet Take 120 mg by mouth daily before breakfast. 120 one day and 90mg  the next day.    [provider]  Turmeric 400 MG CAPS Take 800 mg by mouth.    [provider]    Family History No family history on file.  Social History Social History   Tobacco Use  . Smoking status: Never Smoker  . Smokeless tobacco: Never Used  Substance Use Topics  . Alcohol use: Never    Frequency: Never  . Drug use: Never     Allergies   Ciprocin-fluocin-procin [fluocinolone acetonide], Codeine, Demerol, Penicillins, and Sulfa antibiotics   Review of Systems Review of Systems  Constitutional: Negative for appetite change and fever.  HENT: Negative for trouble swallowing.   Respiratory: Negative for chest tightness and shortness of breath.   Cardiovascular: Negative for chest pain.  Gastrointestinal:  Positive for abdominal distention, abdominal pain, blood in stool and diarrhea. Negative for nausea and vomiting.  Genitourinary: Negative for decreased urine volume, difficulty urinating and dysuria.  Musculoskeletal: Negative for back pain and neck pain.  Skin: Negative for rash.  Neurological: Positive for syncope, weakness and headaches. Negative for dizziness, speech difficulty and numbness.  Psychiatric/Behavioral: Negative for confusion.     Physical Exam Updated Vital Signs BP (!) 144/59 (BP Location: Left Arm)   Pulse 84   Temp 97.6 F (36.4 C) (Oral)   Resp (!) 22   Ht 5\' 1"  (1.549 m)   Wt 46.7 kg   SpO2 100%   BMI  19.46 kg/m   Physical Exam Vitals signs and nursing note reviewed.  Constitutional:      Appearance: Normal appearance.     Comments: Pt is frail, thin appearing  HENT:     Head:     Comments: Small area of ecchymosis and abrasion to the mid upper forehead along the hairline.  No hematoma.  No active bleeding.    Mouth/Throat:     Mouth: Mucous membranes are moist.     Pharynx: Oropharynx is clear.  Eyes:     Extraocular Movements: Extraocular movements intact.     Conjunctiva/sclera: Conjunctivae normal.     Pupils: Pupils are equal, round, and reactive to light.  Neck:     Musculoskeletal: Normal range of motion. No muscular tenderness.  Cardiovascular:     Rate and Rhythm: Normal rate and regular rhythm.     Pulses: Normal pulses.  Pulmonary:     Effort: Pulmonary effort is normal. No respiratory distress.     Breath sounds: Normal breath sounds. No wheezing.  Chest:     Chest wall: No tenderness.  Abdominal:     General: There is no distension.     Palpations: Abdomen is soft. There is no mass.     Tenderness: There is abdominal tenderness. There is no guarding.     Comments: Diffuse periumbilical tenderness on exam.  No guarding or rebound tenderness.  No palpable masses.  Genitourinary:    Rectum: Guaiac result positive. No mass or tenderness. Normal anal tone.     Comments: Red to brown stool, heme positive.  No palpable rectal masses on exam.   Musculoskeletal: Normal range of motion.     Right lower leg: No edema.     Left lower leg: No edema.  Skin:    General: Skin is warm.     Coloration: Skin is not pale.     Findings: No rash.  Neurological:     General: No focal deficit present.     Mental Status: She is alert.     Sensory: Sensation is intact. No sensory deficit.     Motor: Motor function is intact. No weakness or pronator drift.     Comments: CN II-XII grossly intact.  Speech clear      ED Treatments / Results  Labs (all labs ordered are listed,  but only abnormal results are displayed) Labs Reviewed  COMPREHENSIVE METABOLIC PANEL - Abnormal; Notable for the following components:      Result Value   Potassium 3.3 (*)    Glucose, Bld 118 (*)    Calcium 8.1 (*)    Total Protein 4.8 (*)    Albumin 2.5 (*)    All other components within normal limits  CBC WITH DIFFERENTIAL/PLATELET - Abnormal; Notable for the following components:   RBC 1.87 (*)  Hemoglobin 5.3 (*)    HCT 17.9 (*)    MCHC 29.6 (*)    All other components within normal limits  PROTIME-INR - Abnormal; Notable for the following components:   Prothrombin Time 16.5 (*)    INR 1.4 (*)    All other components within normal limits  POC OCCULT BLOOD, ED - Abnormal; Notable for the following components:   Fecal Occult Bld POSITIVE (*)    All other components within normal limits  CK  TYPE AND SCREEN  PREPARE RBC (CROSSMATCH)  ABO/RH    EKG EKG Interpretation  Date/Time:  Tuesday August 27 2019 08:59:56 EST Ventricular Rate:  75 PR Interval:    QRS Duration: 142 QT Interval:  428 QTC Calculation: 479 R Axis:   -76 Text Interpretation: Sinus rhythm Nonspecific IVCD with LAD Probable anteroseptal infarct, recent No old tracing to compare Confirmed by Pricilla LovelessGoldston, Scott [phone]([id number]) on 08/27/2019 11:00:36 AM   Radiology Ct Head Wo Contrast  Result Date: 08/27/2019 CLINICAL DATA:  cHead trauma, minor, GCS>=13, high clinical risk, initial exam, syncope. C-spine trauma, ligamentous injury suspected. EXAM: CT HEAD WITHOUT CONTRAST CT CERVICAL SPINE WITHOUT CONTRAST TECHNIQUE: Multidetector CT imaging of the head and cervical spine was performed following the standard protocol without intravenous contrast. Multiplanar CT image reconstructions of the cervical spine were also generated. COMPARISON:  Head CT 06/13/2008 FINDINGS: CT HEAD FINDINGS Brain: No evidence of acute intracranial hemorrhage. No demarcated cortical infarction. No evidence of intracranial mass. No midline  shift or extra-axial fluid collection. Minimal ill-defined hypoattenuation within the cerebral white matter is nonspecific, but consistent with chronic small vessel ischemic disease. Mild generalized parenchymal atrophy. Vascular: No hyperdense vessel. Skull: Normal. Negative for fracture or focal lesion. Sinuses/Orbits: Visualized orbits demonstrate no acute abnormality. No significant paranasal sinus disease or mastoid effusion. CT CERVICAL SPINE FINDINGS Mildly motion degraded examination. Alignment: No significant spondylolisthesis Skull base and vertebrae: The basion-dental and atlanto-dental intervals are maintained.No evidence of acute fracture to the cervical spine. Soft tissues and spinal canal: No prevertebral fluid or swelling. No visible canal hematoma. Disc levels: Cervical spondylosis with multilevel posterior disc osteophytes, uncovertebral and facet hypertrophy. Upper chest: Emphysema within the imaged lung apices. No visible pneumothorax. Other: Carotid artery atherosclerotic calcification. IMPRESSION: CT head: 1. No evidence of acute intracranial abnormality. 2. Mild generalized parenchymal atrophy and chronic small vessel ischemic disease CT cervical spine: 1. No evidence of acute fracture to the cervical spine. 2. Cervical spondylosis as described. Electronically Signed   By: Jackey LogeKyle  Golden DO   On: 08/27/2019 14:03   Ct Cervical Spine Wo Contrast  Result Date: 08/27/2019 CLINICAL DATA:  Cervical spine trauma secondary to a fall. The patient was found down in the bathroom. EXAM: CT CERVICAL SPINE WITHOUT CONTRAST TECHNIQUE: Multidetector CT imaging of the cervical spine was performed without intravenous contrast. Multiplanar CT image reconstructions were also generated. COMPARISON:  None. FINDINGS: Alignment: Normal. Skull base and vertebrae: No acute fracture. No primary bone lesion or focal pathologic process. Soft tissues and spinal canal: The prevertebral soft tissues are normal. There is  a 15 mm rim calcified nodule in the right lobe of the thyroid gland. No visible canal hematoma. Disc levels: C2-3: Tiny central disc bulge with no neural impingement. Moderate right facet arthritis. No foraminal stenosis. C3-4: Marked disc space narrowing. Small broad-based disc osteophyte complex most prominent centrally. No foraminal stenosis. C4-5: Chronic disc space narrowing. Small disc bulge with accompanying osteophytes to the left of midline. No foraminal stenosis. C5-6: Chronic  disc space narrowing. Slight narrowing of the right neural foramen due to uncinate spurs. No disc bulging or protrusion. C6-7: Disc space narrowing. Minimal endplate osteophytes to the left of midline without neural impingement. Widely patent neural foramina. C7-T1: Normal disc. Slight left facet arthritis. No foraminal stenosis. Upper chest: Negative. Other: None IMPRESSION: 1. No acute abnormality of the cervical spine. Multilevel degenerative disc and joint disease. 2. 15 mm rim calcified nodule in the right lobe of the thyroid gland. Electronically Signed   By: Francene Boyers M.D.   On: 08/27/2019 13:57   Ct Abdomen Pelvis W Contrast  Result Date: 08/27/2019 CLINICAL DATA:  85 year old female with abdominal pain and rectal bleeding. EXAM: CT ABDOMEN AND PELVIS WITH CONTRAST TECHNIQUE: Multidetector CT imaging of the abdomen and pelvis was performed using the standard protocol following bolus administration of intravenous contrast. CONTRAST:  OMNIPAQUE IOHEXOL 300 MG/ML  SOLN COMPARISON:  None. FINDINGS: Lower chest: Partially visualized small bilateral pleural effusions with associated partial compressive atelectasis of the lung bases. Pneumonia is not excluded. Clinical correlation is recommended. There is diffuse interstitial and interlobular septal thickening of the visualized lung bases most consistent with edema. There is mild cardiomegaly. A pericardial effusion is partially visualized measuring approximately 1  cm in thickness. No intra-abdominal free air. There is a moderate size ascites. Hepatobiliary: Nodular liver contour in keeping with cirrhosis. No intrahepatic biliary ductal dilatation. The gallbladder is unremarkable. Pancreas: Unremarkable. No pancreatic ductal dilatation or surrounding inflammatory changes. Spleen: Normal in size without focal abnormality. Adrenals/Urinary Tract: The right adrenal gland is unremarkable. The left adrenal gland is not well visualized. There is no hydronephrosis on either side. There is symmetric enhancement and excretion of contrast by both kidneys. There is a 4.5 cm left renal inferior pole cyst. The urinary bladder is grossly unremarkable. Stomach/Bowel: There is sigmoid diverticulosis without active inflammatory changes. There is diffuse thickened appearance of the colon which may be related to ascites and hepatic colopathy. Colitis is not excluded. Clinical correlation is recommended. Similarly there is diffuse thickened appearance of the stomach and small bowel likely related to ascites and liver disease. There is a small hiatal hernia with evidence of gastroesophageal reflux. The appendix is not visualized with certainty. Vascular/Lymphatic: There is advanced aortoiliac atherosclerotic disease. The IVC is unremarkable. No portal venous gas. There is no adenopathy. The SMV, splenic vein, and main portal vein are patent. Reproductive: Hysterectomy. No adnexal masses. Other: Diffuse subcutaneous edema and anasarca. Musculoskeletal: Degenerative changes of the spine. No acute osseous pathology. IMPRESSION: 1. Cirrhosis with evidence of portal hypertension, moderate ascites, and anasarca. 2. Thickened appearance of the colon and small bowel likely related to ascites and liver disease. Colitis is not excluded. Clinical correlation is recommended. 3. Sigmoid diverticulosis. 4. Cardiomegaly with findings of CHF. Partially visualized small bilateral pleural effusions with associated  partial compressive atelectasis of the lung bases. Pneumonia is not excluded. Clinical correlation is recommended. Aortic Atherosclerosis (ICD10-I70.0). Electronically Signed   By: Elgie Collard M.D.   On: 08/27/2019 14:01    Procedures Procedures (including critical care time)  Medications Ordered in ED Medications  sodium chloride 0.9 % bolus 1,000 mL (0 mLs Intravenous Stopped 08/27/19 1244)    And  0.9 %  sodium chloride infusion (has no administration in time range)  sertraline (ZOLOFT) tablet 25 mg (has no administration in time range)  furosemide (LASIX) tablet 40 mg (has no administration in time range)  0.9 %  sodium chloride infusion (10 mL/hr Intravenous New  Bag/Given 08/27/19 1244)  iohexol (OMNIPAQUE) 300 MG/ML solution 100 mL (100 mLs Intravenous Contrast Given 08/27/19 1349)  thyroid (ARMOUR) tablet 120 mg (120 mg Oral Given 08/27/19 1231)     Initial Impression / Assessment and Plan / ED Course  I have reviewed the triage vital signs and the nursing notes.  Pertinent labs & imaging results that were available during my care of the patient were reviewed by me and considered in my medical decision making (see chart for details).    CRITICAL CARE Performed by: Acey Woodfield Total critical care time: 35 minutes Critical care time was exclusive of separately billable procedures and treating other patients. Critical care was necessary to treat or prevent imminent or life-threatening deterioration. Critical care was time spent personally by me on the following activities: development of treatment plan with patient and/or surrogate as well as nursing, discussions with consultants, evaluation of patient's response to treatment, examination of patient, obtaining history from patient or surrogate, ordering and performing treatments and interventions, ordering and review of laboratory studies, ordering and review of radiographic studies, pulse oximetry and re-evaluation of  patient's condition.   pt with syncopal episode this morning presumably from blood loss.  Baseline hemoglobin undetermined.  Heme positive stools today.  1120  Notified by nursing that hgb is 5.3.  Will order transfusion for 2 units PRBC's  Consulted hospitalist, who agrees to admit.  Requests consult for GI.    1505  Consulted Dr. Karilyn Cota who agrees to see pt in consult.  Clear liquids tonight, will likely perform colonoscopy tomorrow.    Final Clinical Impressions(s) / ED Diagnoses   Final diagnoses:  Acute GI bleeding    ED Discharge Orders    None       Pauline Aus, PA-C 08/27/19 1558    Pricilla Loveless, MD 08/29/19 [phone]

## 2019-08-27 NOTE — ED Triage Notes (Signed)
Pt brought in by [facility] for c/o rectal blood; pt called her daughter and daughter came over to pt's house and found pt lying in the bathroom floor with the toilet full of bright red blood; pt's called her PCP about the rectal bleeding and was told to monitor her bowel movements; pt has an appointment with her PCP today; pt c/o some sob and weakness and feeling cold; pt very pale; pt states when she went to the bathroom this am she passed out and is unsure how long she laid on the floor

## 2019-08-27 NOTE — ED Notes (Signed)
Pt bed linens changed due to leaking purewick. Purewick replaced.

## 2019-08-27 NOTE — H&P (Addendum)
Patient Demographics:    [name], is a 85 y.o. female  MRN: [id number]   DOB - 26-Apr-1933  Admit Date - 08/27/2019  Outpatient Primary MD for the patient is Sharilyn Sites, MD   Assessment & Plan:    Principal Problem:   Symptomatic anemia Active Problems:   Cirrhosis of liver with ascites and portal hypertension   Portal hypertension --- liver cirrhosis   Diarrhea   1) acute GI bleed with symptomatic anemia--PTA patient was taking Stanback headache (ASA 845 + Caffeine 65 mg)  -hemoglobin down to 5.3, recent baseline not available -Transfused 2 units of packed cells, = GI consult from Dr. Laural Golden requested -Patient previously declined endoluminal evaluation -Check serial H&H and transfuse as clinically indicated -CT abdomen and pelvis with evidence of portal hypertension and diverticulosis -IV Protonix as ordered  2) chronic diarrhea--longstanding problem, patient uses Lomotil as needed, infectious etiology unlikely  3) hypothyroidism--previously on Armour Thyroid, check TSH  4)Syncope-suspect due to symptomatic anemia with hemoglobin down to 5.3 in setting of GI bleeding --admit to telemetry monitored unit, watch for arrhythmias, check serial troponins and EKG for rule out ACS protocol, check echocardiogram to rule out significant aortic stenosis or other outflow obstruction, and also to evaluate EF and to rule out segmental/Regional wall motion abnormalities.   5)Possible liver cirrhosis with portal hypertension and ascites --- CT abdomen and pelvis suggest cirrhosis of the liver with evidence of portal hypertension with moderate ascites and anasarca----GI consult pending -Get liver ultrasound -Give low-dose propranolol   Social/Ethics--plan of care discussed with patient and her daughter,  Additional history obtained from patient's daughter [name] at [phone] -Patient is a full code  With History of - Reviewed by me  Past Medical History:  Diagnosis Date   Thyroid disease    hypothyroidism      Chief Complaint  Patient presents with   Rectal Bleeding      HPI:    [name]  is a 85 y.o. female with past medical history relevant for  chronic diarrhea, hypothyroidism and Sjogren's who presents to the ED after passing out at home in the setting of rectal bleeding -PTA patient was taking Stanback headache (ASA 845 + Caffeine 65 mg)   -She has chronic ongoing diarrhea for years now, takes Lomotil for this --- Started having intermittent blood with her diarrhea over the last 5 or 6 days -However over the last 24 hours had significant amount of blood with her diarrhea -- Hemoglobin was 14.5 in 2014, no recent hemoglobin available -Today in the ED hemoglobin is down to 5.3--again patient presented with dizziness and syncope  -No fevers, no chills, no chest pains or palpitations -No emesis.... She does have some abdominal discomfort  In ED -CT head without acute findings -CT C-spine without acute findings -CT abdomen with cirrhosis and moderate ascites and anasarca with thickened appearance of the colon and small bowel as well as sigmoid diverticulosis with  concerns about cardiomegaly and possible CHF  Additional history obtained from patient's daughter [name] at [phone]    Review of systems:    In addition to the HPI above,   A full Review of  Systems was done, all other systems reviewed are negative except as noted above in HPI , .    Social History:  Reviewed by me    Social History   Tobacco Use   Smoking status: Never Smoker   Smokeless tobacco: Never Used  Substance Use Topics   Alcohol use: Never    Frequency: Never     Family History :  Reviewed by me  -HTN   Home Medications:   Prior to  Admission medications   Medication Sig Start Date End Date Taking? Authorizing Provider  Aspirin (STANBACK HEADACHE POWDER PO) Take 1 packet by mouth daily.    Yes [provider]  Calcium-Magnesium-Zinc [phone] MG TABS Take by mouth.   Yes [provider]  Cholecalciferol (VITAMIN D3) 10 MCG (400 UNIT) CAPS Take 1,000 mg by mouth daily.    Yes [provider]  co-enzyme Q-10 30 MG capsule Take 30 mg by mouth daily.    Yes [provider]  esomeprazole (NEXIUM) 20 MG capsule Take 20 mg by mouth daily at 12 noon.   Yes [provider]  Ferrous Sulfate (IRON) 325 (65 Fe) MG TABS Take 1 tablet by mouth daily.   Yes [provider]  Omega-3 Fatty Acids (FISH OIL PO) Take 1 capsule by mouth daily.   Yes [provider]  sertraline (ZOLOFT) 25 MG tablet Take 25 mg by mouth daily.   Yes [provider]  thyroid (ARMOUR) 120 MG tablet Take 120 mg by mouth daily before breakfast. 120 one day and  the next day.   Yes [provider]  Turmeric (QC TUMERIC COMPLEX PO) Take 1 capsule by mouth daily.   Yes [provider]  Turmeric 400 MG CAPS Take 400 mg by mouth daily.    Yes [provider]  CALCIUM MAGNESIUM 750 PO Take 1 tablet by mouth daily.     [provider]  loperamide (IMODIUM) 2 MG capsule Take 2 mg by mouth as needed for diarrhea or loose stools.    [provider]     Allergies:     Allergies  Allergen Reactions   Ciprofloxacin Other (See Comments)    NO REACTION LISTED   Ciprocin-Fluocin-Procin [Fluocinolone Acetonide]    Codeine    Demerol    Penicillins     Did it involve swelling of the face/tongue/throat, SOB, or low BP? Unknown Did it involve sudden or severe rash/hives, skin peeling, or any reaction on the inside of your mouth or nose? Unknown Did you need to seek medical attention at a hospital or doctor's office? Yes When did it last  happen?Uknown If all above answers are NO, may proceed with cephalosporin use.    Sulfa Antibiotics      Physical Exam:   Vitals  Blood pressure (!) 152/75, pulse 80, temperature 97.9 F (36.6 C), temperature source Oral, resp. rate 16, height  (1.549 m), weight 46.7 kg, SpO2 95 %.  Physical Examination: General appearance - alert, well appearing, and in no distress  HEAD-hemostatic forehead abrasion without frank hematoma Mental status - alert, oriented to person, place, and time,  Eyes - sclera anicteric Neck - supple, no JVD elevation , Chest - clear  to auscultation bilaterally, symmetrical air movement,  Heart - S1 and S2 normal, regular  Abdomen - soft, generalized abdominal discomfort on palpation, no rebound or guarding  neurological - screening mental status exam normal, neck supple without rigidity, cranial nerves II through XII intact, DTR's normal and symmetric Extremities - no pedal edema noted, intact peripheral pulses  Skin - warm, dry     Data Review:    CBC Recent Labs  Lab 08/27/19 1038  WBC 7.3  HGB 5.3*  HCT 17.9*  PLT 166  MCV 95.7  MCH 28.3  MCHC 29.6*  RDW Not Measured  LYMPHSABS 0.8  MONOABS 0.5  EOSABS 0.0  BASOSABS 0.0   ------------------------------------------------------------------------------------------------------------------  Chemistries  Recent Labs  Lab 08/27/19 1038  NA 139  K 3.3*  CL 108  CO2 23  GLUCOSE 118*  BUN 11  CREATININE 0.55  CALCIUM 8.1*  AST 31  ALT 16  ALKPHOS 50  BILITOT 0.8   ------------------------------------------------------------------------------------------------------------------ estimated creatinine clearance is 38.6 mL/min (by C-G formula based on SCr of 0.55 mg/dL). ------------------------------------------------------------------------------------------------------------------ No results for input(s): TSH, T4TOTAL, T3FREE, THYROIDAB in the last 72 hours.  Invalid  input(s): FREET3   Coagulation profile Recent Labs  Lab 08/27/19 1038  INR 1.4*   ------------------------------------------------------------------------------------------------------------------- No results for input(s): DDIMER in the last 72 hours. -------------------------------------------------------------------------------------------------------------------  Cardiac Enzymes No results for input(s): CKMB, TROPONINI, MYOGLOBIN in the last 168 hours.  Invalid input(s): CK ------------------------------------------------------------------------------------------------------------------ No results found for: BNP   ---------------------------------------------------------------------------------------------------------------  Urinalysis No results found for: COLORURINE, APPEARANCEUR, LABSPEC, PHURINE, GLUCOSEU, HGBUR, BILIRUBINUR, KETONESUR, PROTEINUR, UROBILINOGEN, NITRITE, LEUKOCYTESUR  ----------------------------------------------------------------------------------------------------------------   Imaging Results:    Ct Head Wo Contrast  Result Date: 08/27/2019 CLINICAL DATA:  cHead trauma, minor, GCS>=13, high clinical risk, initial exam, syncope. C-spine trauma, ligamentous injury suspected. EXAM: CT HEAD WITHOUT CONTRAST CT CERVICAL SPINE WITHOUT CONTRAST TECHNIQUE: Multidetector CT imaging of the head and cervical spine was performed following the standard protocol without intravenous contrast. Multiplanar CT image reconstructions of the cervical spine were also generated. COMPARISON:  Head CT 06/13/2008 FINDINGS: CT HEAD FINDINGS Brain: No evidence of acute intracranial hemorrhage. No demarcated cortical infarction. No evidence of intracranial mass. No midline shift or extra-axial fluid collection. Minimal ill-defined hypoattenuation within the cerebral white matter is nonspecific, but consistent with chronic small vessel ischemic disease. Mild generalized parenchymal  atrophy. Vascular: No hyperdense vessel. Skull: Normal. Negative for fracture or focal lesion. Sinuses/Orbits: Visualized orbits demonstrate no acute abnormality. No significant paranasal sinus disease or mastoid effusion. CT CERVICAL SPINE FINDINGS Mildly motion degraded examination. Alignment: No significant spondylolisthesis Skull base and vertebrae: The basion-dental and atlanto-dental intervals are maintained.No evidence of acute fracture to the cervical spine. Soft tissues and spinal canal: No prevertebral fluid or swelling. No visible canal hematoma. Disc levels: Cervical spondylosis with multilevel posterior disc osteophytes, uncovertebral and facet hypertrophy. Upper chest: Emphysema within the imaged lung apices. No visible pneumothorax. Other: Carotid artery atherosclerotic calcification. IMPRESSION: CT head: 1. No evidence of acute intracranial abnormality. 2. Mild generalized parenchymal atrophy and chronic small vessel ischemic disease CT cervical spine: 1. No evidence of acute fracture to the cervical spine. 2. Cervical spondylosis as described. Electronically Signed   By: Jackey Loge DO   On: 08/27/2019 14:03   Ct Cervical Spine Wo Contrast  Result Date: 08/27/2019 CLINICAL DATA:  Cervical spine trauma secondary to a fall. The patient was found down in the bathroom. EXAM: CT CERVICAL SPINE WITHOUT CONTRAST TECHNIQUE: Multidetector CT imaging of the cervical spine was performed without intravenous contrast. Multiplanar  CT image reconstructions were also generated. COMPARISON:  None. FINDINGS: Alignment: Normal. Skull base and vertebrae: No acute fracture. No primary bone lesion or focal pathologic process. Soft tissues and spinal canal: The prevertebral soft tissues are normal. There is a 15 mm rim calcified nodule in the right lobe of the thyroid gland. No visible canal hematoma. Disc levels: C2-3: Tiny central disc bulge with no neural impingement. Moderate right facet arthritis. No foraminal  stenosis. C3-4: Marked disc space narrowing. Small broad-based disc osteophyte complex most prominent centrally. No foraminal stenosis. C4-5: Chronic disc space narrowing. Small disc bulge with accompanying osteophytes to the left of midline. No foraminal stenosis. C5-6: Chronic disc space narrowing. Slight narrowing of the right neural foramen due to uncinate spurs. No disc bulging or protrusion. C6-7: Disc space narrowing. Minimal endplate osteophytes to the left of midline without neural impingement. Widely patent neural foramina. C7-T1: Normal disc. Slight left facet arthritis. No foraminal stenosis. Upper chest: Negative. Other: None IMPRESSION: 1. No acute abnormality of the cervical spine. Multilevel degenerative disc and joint disease. 2. 15 mm rim calcified nodule in the right lobe of the thyroid gland. Electronically Signed   By: Francene BoyersJames  Maxwell M.D.   On: 08/27/2019 13:57   Ct Abdomen Pelvis W Contrast  Result Date: 08/27/2019 CLINICAL DATA:  85 year old female with abdominal pain and rectal bleeding. EXAM: CT ABDOMEN AND PELVIS WITH CONTRAST TECHNIQUE: Multidetector CT imaging of the abdomen and pelvis was performed using the standard protocol following bolus administration of intravenous contrast. CONTRAST:  100mL OMNIPAQUE IOHEXOL 300 MG/ML  SOLN COMPARISON:  None. FINDINGS: Lower chest: Partially visualized small bilateral pleural effusions with associated partial compressive atelectasis of the lung bases. Pneumonia is not excluded. Clinical correlation is recommended. There is diffuse interstitial and interlobular septal thickening of the visualized lung bases most consistent with edema. There is mild cardiomegaly. A pericardial effusion is partially visualized measuring approximately 1 cm in thickness. No intra-abdominal free air. There is a moderate size ascites. Hepatobiliary: Nodular liver contour in keeping with cirrhosis. No intrahepatic biliary ductal dilatation. The gallbladder is  unremarkable. Pancreas: Unremarkable. No pancreatic ductal dilatation or surrounding inflammatory changes. Spleen: Normal in size without focal abnormality. Adrenals/Urinary Tract: The right adrenal gland is unremarkable. The left adrenal gland is not well visualized. There is no hydronephrosis on either side. There is symmetric enhancement and excretion of contrast by both kidneys. There is a 4.5 cm left renal inferior pole cyst. The urinary bladder is grossly unremarkable. Stomach/Bowel: There is sigmoid diverticulosis without active inflammatory changes. There is diffuse thickened appearance of the colon which may be related to ascites and hepatic colopathy. Colitis is not excluded. Clinical correlation is recommended. Similarly there is diffuse thickened appearance of the stomach and small bowel likely related to ascites and liver disease. There is a small hiatal hernia with evidence of gastroesophageal reflux. The appendix is not visualized with certainty. Vascular/Lymphatic: There is advanced aortoiliac atherosclerotic disease. The IVC is unremarkable. No portal venous gas. There is no adenopathy. The SMV, splenic vein, and main portal vein are patent. Reproductive: Hysterectomy. No adnexal masses. Other: Diffuse subcutaneous edema and anasarca. Musculoskeletal: Degenerative changes of the spine. No acute osseous pathology. IMPRESSION: 1. Cirrhosis with evidence of portal hypertension, moderate ascites, and anasarca. 2. Thickened appearance of the colon and small bowel likely related to ascites and liver disease. Colitis is not excluded. Clinical correlation is recommended. 3. Sigmoid diverticulosis. 4. Cardiomegaly with findings of CHF. Partially visualized small bilateral pleural effusions with associated  partial compressive atelectasis of the lung bases. Pneumonia is not excluded. Clinical correlation is recommended. Aortic Atherosclerosis (ICD10-I70.0). Electronically Signed   By: Elgie Collard M.D.    On: 08/27/2019 14:01    Radiological Exams on Admission: Ct Head Wo Contrast  Result Date: 08/27/2019 CLINICAL DATA:  cHead trauma, minor, GCS>=13, high clinical risk, initial exam, syncope. C-spine trauma, ligamentous injury suspected. EXAM: CT HEAD WITHOUT CONTRAST CT CERVICAL SPINE WITHOUT CONTRAST TECHNIQUE: Multidetector CT imaging of the head and cervical spine was performed following the standard protocol without intravenous contrast. Multiplanar CT image reconstructions of the cervical spine were also generated. COMPARISON:  Head CT 06/13/2008 FINDINGS: CT HEAD FINDINGS Brain: No evidence of acute intracranial hemorrhage. No demarcated cortical infarction. No evidence of intracranial mass. No midline shift or extra-axial fluid collection. Minimal ill-defined hypoattenuation within the cerebral white matter is nonspecific, but consistent with chronic small vessel ischemic disease. Mild generalized parenchymal atrophy. Vascular: No hyperdense vessel. Skull: Normal. Negative for fracture or focal lesion. Sinuses/Orbits: Visualized orbits demonstrate no acute abnormality. No significant paranasal sinus disease or mastoid effusion. CT CERVICAL SPINE FINDINGS Mildly motion degraded examination. Alignment: No significant spondylolisthesis Skull base and vertebrae: The basion-dental and atlanto-dental intervals are maintained.No evidence of acute fracture to the cervical spine. Soft tissues and spinal canal: No prevertebral fluid or swelling. No visible canal hematoma. Disc levels: Cervical spondylosis with multilevel posterior disc osteophytes, uncovertebral and facet hypertrophy. Upper chest: Emphysema within the imaged lung apices. No visible pneumothorax. Other: Carotid artery atherosclerotic calcification. IMPRESSION: CT head: 1. No evidence of acute intracranial abnormality. 2. Mild generalized parenchymal atrophy and chronic small vessel ischemic disease CT cervical spine: 1. No evidence of acute  fracture to the cervical spine. 2. Cervical spondylosis as described. Electronically Signed   By: Jackey Loge DO   On: 08/27/2019 14:03   Ct Cervical Spine Wo Contrast  Result Date: 08/27/2019 CLINICAL DATA:  Cervical spine trauma secondary to a fall. The patient was found down in the bathroom. EXAM: CT CERVICAL SPINE WITHOUT CONTRAST TECHNIQUE: Multidetector CT imaging of the cervical spine was performed without intravenous contrast. Multiplanar CT image reconstructions were also generated. COMPARISON:  None. FINDINGS: Alignment: Normal. Skull base and vertebrae: No acute fracture. No primary bone lesion or focal pathologic process. Soft tissues and spinal canal: The prevertebral soft tissues are normal. There is a 15 mm rim calcified nodule in the right lobe of the thyroid gland. No visible canal hematoma. Disc levels: C2-3: Tiny central disc bulge with no neural impingement. Moderate right facet arthritis. No foraminal stenosis. C3-4: Marked disc space narrowing. Small broad-based disc osteophyte complex most prominent centrally. No foraminal stenosis. C4-5: Chronic disc space narrowing. Small disc bulge with accompanying osteophytes to the left of midline. No foraminal stenosis. C5-6: Chronic disc space narrowing. Slight narrowing of the right neural foramen due to uncinate spurs. No disc bulging or protrusion. C6-7: Disc space narrowing. Minimal endplate osteophytes to the left of midline without neural impingement. Widely patent neural foramina. C7-T1: Normal disc. Slight left facet arthritis. No foraminal stenosis. Upper chest: Negative. Other: None IMPRESSION: 1. No acute abnormality of the cervical spine. Multilevel degenerative disc and joint disease. 2. 15 mm rim calcified nodule in the right lobe of the thyroid gland. Electronically Signed   By: Francene Boyers M.D.   On: 08/27/2019 13:57   Ct Abdomen Pelvis W Contrast  Result Date: 08/27/2019 CLINICAL DATA:  85 year old female with abdominal  pain and rectal bleeding. EXAM: CT ABDOMEN AND  PELVIS WITH CONTRAST TECHNIQUE: Multidetector CT imaging of the abdomen and pelvis was performed using the standard protocol following bolus administration of intravenous contrast. CONTRAST:  OMNIPAQUE IOHEXOL 300 MG/ML  SOLN COMPARISON:  None. FINDINGS: Lower chest: Partially visualized small bilateral pleural effusions with associated partial compressive atelectasis of the lung bases. Pneumonia is not excluded. Clinical correlation is recommended. There is diffuse interstitial and interlobular septal thickening of the visualized lung bases most consistent with edema. There is mild cardiomegaly. A pericardial effusion is partially visualized measuring approximately 1 cm in thickness. No intra-abdominal free air. There is a moderate size ascites. Hepatobiliary: Nodular liver contour in keeping with cirrhosis. No intrahepatic biliary ductal dilatation. The gallbladder is unremarkable. Pancreas: Unremarkable. No pancreatic ductal dilatation or surrounding inflammatory changes. Spleen: Normal in size without focal abnormality. Adrenals/Urinary Tract: The right adrenal gland is unremarkable. The left adrenal gland is not well visualized. There is no hydronephrosis on either side. There is symmetric enhancement and excretion of contrast by both kidneys. There is a 4.5 cm left renal inferior pole cyst. The urinary bladder is grossly unremarkable. Stomach/Bowel: There is sigmoid diverticulosis without active inflammatory changes. There is diffuse thickened appearance of the colon which may be related to ascites and hepatic colopathy. Colitis is not excluded. Clinical correlation is recommended. Similarly there is diffuse thickened appearance of the stomach and small bowel likely related to ascites and liver disease. There is a small hiatal hernia with evidence of gastroesophageal reflux. The appendix is not visualized with certainty. Vascular/Lymphatic: There is advanced  aortoiliac atherosclerotic disease. The IVC is unremarkable. No portal venous gas. There is no adenopathy. The SMV, splenic vein, and main portal vein are patent. Reproductive: Hysterectomy. No adnexal masses. Other: Diffuse subcutaneous edema and anasarca. Musculoskeletal: Degenerative changes of the spine. No acute osseous pathology. IMPRESSION: 1. Cirrhosis with evidence of portal hypertension, moderate ascites, and anasarca. 2. Thickened appearance of the colon and small bowel likely related to ascites and liver disease. Colitis is not excluded. Clinical correlation is recommended. 3. Sigmoid diverticulosis. 4. Cardiomegaly with findings of CHF. Partially visualized small bilateral pleural effusions with associated partial compressive atelectasis of the lung bases. Pneumonia is not excluded. Clinical correlation is recommended. Aortic Atherosclerosis (ICD10-I70.0). Electronically Signed   By: Elgie Collard M.D.   On: 08/27/2019 14:01    DVT Prophylaxis -SCD (Gi Bleed) AM Labs Ordered, also please review Full Orders  Family Communication: Admission, patients condition and plan of care including tests being ordered have been discussed with the patient who indicate understanding and agree with the plan   Code Status - Full Code  Likely DC to  home  Condition   stable  Shon Hale M.D on 08/27/2019 at 7:35 PM Go to [email or web address] -  for contact info  [facility] - Office  [phone]

## 2019-08-28 ENCOUNTER — Inpatient Hospital Stay (HOSPITAL_COMMUNITY): Payer: Medicare Other

## 2019-08-28 DIAGNOSIS — I361 Nonrheumatic tricuspid (valve) insufficiency: Secondary | ICD-10-CM

## 2019-08-28 DIAGNOSIS — K922 Gastrointestinal hemorrhage, unspecified: Secondary | ICD-10-CM

## 2019-08-28 DIAGNOSIS — R0689 Other abnormalities of breathing: Secondary | ICD-10-CM

## 2019-08-28 DIAGNOSIS — I340 Nonrheumatic mitral (valve) insufficiency: Secondary | ICD-10-CM

## 2019-08-28 DIAGNOSIS — R0600 Dyspnea, unspecified: Secondary | ICD-10-CM

## 2019-08-28 DIAGNOSIS — K7031 Alcoholic cirrhosis of liver with ascites: Secondary | ICD-10-CM

## 2019-08-28 DIAGNOSIS — K766 Portal hypertension: Secondary | ICD-10-CM

## 2019-08-28 LAB — BASIC METABOLIC PANEL
Anion gap: 12 (ref 5–15)
BUN: 10 mg/dL (ref 8–23)
CO2: 22 mmol/L (ref 22–32)
Calcium: 8.1 mg/dL — ABNORMAL LOW (ref 8.9–10.3)
Chloride: 103 mmol/L (ref 98–111)
Creatinine, Ser: 0.61 mg/dL (ref 0.44–1.00)
GFR calc Af Amer: >60 mL/min (ref >60)
GFR calc non Af Amer: >60 mL/min (ref >60)
Glucose, Bld: 101 mg/dL — ABNORMAL HIGH (ref 70–99)
Potassium: 3 mmol/L — ABNORMAL LOW (ref 3.5–5.1)
Sodium: 137 mmol/L (ref 135–145)

## 2019-08-28 LAB — CBC
HCT: 25.3 % — ABNORMAL LOW (ref 36.0–46.0)
Hemoglobin: 8.1 g/dL — ABNORMAL LOW (ref 12.0–15.0)
MCH: 29.6 pg (ref 26.0–34.0)
MCHC: 32 g/dL (ref 30.0–36.0)
MCV: 92.3 fL (ref 80.0–100.0)
Platelets: 145 10*3/uL — ABNORMAL LOW (ref 150–400)
RBC: 2.74 MIL/uL — ABNORMAL LOW (ref 3.87–5.11)
RDW: 19.9 % — ABNORMAL HIGH (ref 11.5–15.5)
WBC: 10.8 10*3/uL — ABNORMAL HIGH (ref 4.0–10.5)
nRBC: 0 % (ref 0.0–0.2)

## 2019-08-28 LAB — ECHOCARDIOGRAM COMPLETE
Height: 60 in
Weight: 1650.8 [oz_av]

## 2019-08-28 LAB — PROTIME-INR
INR: 1.4 — ABNORMAL HIGH (ref 0.8–1.2)
Prothrombin Time: 16.7 seconds — ABNORMAL HIGH (ref 11.4–15.2)

## 2019-08-28 LAB — MRSA PCR SCREENING: MRSA by PCR: NEGATIVE

## 2019-08-28 LAB — T4, FREE: Free T4: 0.76 ng/dL (ref 0.61–1.12)

## 2019-08-28 LAB — SARS CORONAVIRUS 2 (TAT 6-24 HRS): SARS Coronavirus 2: NEGATIVE

## 2019-08-28 MED ORDER — PEG 3350-KCL-NA BICARB-NACL 420 G PO SOLR
4000.0000 mL | Freq: Once | ORAL | Status: AC
  Administered 2019-08-29: 4000 mL via ORAL

## 2019-08-28 MED ORDER — POTASSIUM CHLORIDE 10 MEQ/100ML IV SOLN
10.0000 meq | INTRAVENOUS | Status: AC
  Administered 2019-08-28 (×4): 10 meq via INTRAVENOUS
  Filled 2019-08-28 (×5): qty 100

## 2019-08-28 MED ORDER — SODIUM CHLORIDE 0.9 % IV SOLN
1.0000 g | INTRAVENOUS | Status: DC
  Administered 2019-08-28 – 2019-08-31 (×4): 1 g via INTRAVENOUS
  Filled 2019-08-28 (×4): qty 10

## 2019-08-28 MED ORDER — ORAL CARE MOUTH RINSE
15.0000 mL | Freq: Two times a day (BID) | OROMUCOSAL | Status: DC
  Administered 2019-08-28 – 2019-09-01 (×10): 15 mL via OROMUCOSAL

## 2019-08-28 NOTE — Plan of Care (Signed)
  Problem: Acute Rehab PT Goals(only PT should resolve) Goal: Pt Will Go Supine/Side To Sit Flowsheets (Taken 08/28/2019 1543) Pt will go Supine/Side to Sit: with modified independence Note: No use of hand rails Goal: Patient Will Transfer Sit To/From Stand Flowsheets (Taken 08/28/2019 1543) Patient will transfer sit to/from stand: with modified independence Goal: Pt Will Transfer Bed To Chair/Chair To Bed Flowsheets (Taken 08/28/2019 1543) Pt will Transfer Bed to Chair/Chair to Bed: with modified independence Goal: Pt Will Ambulate Flowsheets (Taken 08/28/2019 1543) Pt will Ambulate:  100 feet  with least restrictive assistive device  with supervision   3:44 PM, 08/28/19 Josue Hector PT DPT  Physical Therapist with [facility]  [phone]

## 2019-08-28 NOTE — Consult Note (Signed)
Referring Provider: No ref. provider found Primary Care Physician:  Sharilyn Sites, MD Primary Gastroenterologist:  Dr. Laural Golden   Reason for Consultation: Rectal bleeding, anemia  HPI: [name] is a 85 y.o. female with a past medical history of hypothyroidism, chronic diarrhea, and Sjogren's syndrome.  She has chronic loose stools.  Approximately 1 week ago, she passed only blood per the rectum.  The next day she passed a normal loose stool without any further rectal bleeding so she was not concerned.  Yesterday morning, she awakened around 6 AM to go to the bathroom and passed a large amount of bright red blood from the rectum.  She did not pass any stool at this time.  She felt weak and had a syncopal episode.  Her daughter arrived and found her on the bathroom floor.  Her daughter called 911 and she was transferred to [facility] emergency room for further evaluation.  She denies having any upper or lower abdominal pain.  No nausea or vomiting.  She denies having any stool or blood from the rectum since presenting to emergency room.  Her hemoglobin was 5.3.  She received 2 units of packed red blood cells.  No prior history of upper or lower GI bleed.  An abdominal/pelvic CT identified cirrhosis with evidence of portal hypertension and moderate ascites with anasarca.  The colon and small bowel were mildly thickened most likely due to ascites and liver disease, however, colitis was not excluded.  Sigmoid diverticulosis was noted.  She is unaware of having the diagnosis of cirrhosis.  She denies alcohol use.  No family history of liver disease.  She has previously declined a screening colonoscopy.  No family history of colorectal cancer.   ED course: Sodium 139.  Potassium 3.3.  Glucose 118.  BUN 11.  Creatinine 0.55.  Calcium 8.1.  Alk phos 50.  Albumin 2.5.  AST 31.  ALT 16.  Total bili 0.8 WBC 7.3.  Hemoglobin 5.3.  Hematocrit 17.9.  MCV 95.7.  Platelet 166.  She was transfused  with 2 units of packed red blood cells.  Repeat H&H 8.8.  Abdominal/pelvic CT with contrast: 1. Cirrhosis with evidence of portal hypertension, moderate ascites, and anasarca. 2. Thickened appearance of the colon and small bowel likely related to ascites and liver disease. Colitis is not excluded. Clinical correlation is recommended. 3. Sigmoid diverticulosis. 4. Cardiomegaly with findings of CHF. Partially visualized small bilateral pleural effusions with associated partial compressive atelectasis of the lung bases. Pneumonia is not excluded. Clinical correlation is recommended.   Past Medical History:  Diagnosis Date   Thyroid disease    hypothyroidism    Past Surgical History:  Procedure Laterality Date   COLONOSCOPY WITH ESOPHAGOGASTRODUODENOSCOPY (EGD) AND ESOPHAGEAL DILATION (ED)     years ago   complete hysterectomy     left rotator cuff      Prior to Admission medications   Medication Sig Start Date End Date Taking? Authorizing Provider  Aspirin (STANBACK HEADACHE POWDER PO) Take 1 packet by mouth daily.    Yes [provider]  Calcium-Magnesium-Zinc [phone] MG TABS Take by mouth.   Yes [provider]  Cholecalciferol (VITAMIN D3) 10 MCG (400 UNIT) CAPS Take 1,000 mg by mouth daily.    Yes [provider]  co-enzyme Q-10 30 MG capsule Take 30 mg by mouth daily.    Yes [provider]  esomeprazole (NEXIUM) 20 MG capsule Take 20 mg by mouth daily at 12 noon.  Yes [provider]  Ferrous Sulfate (IRON) 325 (65 Fe) MG TABS Take 1 tablet by mouth daily.   Yes [provider]  Omega-3 Fatty Acids (FISH OIL PO) Take 1 capsule by mouth daily.   Yes [provider]  sertraline (ZOLOFT) 25 MG tablet Take 25 mg by mouth daily.   Yes [provider]  thyroid (ARMOUR) 120 MG tablet Take 120 mg by mouth daily before breakfast. 120 one day and 41m the next day.   Yes [provider]  Turmeric  (QC TUMERIC COMPLEX PO) Take 1 capsule by mouth daily.   Yes [provider]  Turmeric 400 MG CAPS Take 400 mg by mouth daily.    Yes [provider]  CALCIUM MAGNESIUM 750 PO Take 1 tablet by mouth daily.     [provider]  loperamide (IMODIUM) 2 MG capsule Take 2 mg by mouth as needed for diarrhea or loose stools.    [provider]    Current Facility-Administered Medications  Medication Dose Route Frequency Provider Last Rate Last Dose   0.9 %  sodium chloride infusion  250 mL Intravenous PRN Emokpae, Courage, MD       acetaminophen (TYLENOL) tablet 650 mg  650 mg Oral Q6H PRN Emokpae, Courage, MD       Or   acetaminophen (TYLENOL) suppository 650 mg  650 mg Rectal Q6H PRN Emokpae, Courage, MD       albuterol (PROVENTIL) (2.5 MG/3ML) 0.083% nebulizer solution 2.5 mg  2.5 mg Nebulization Q2H PRN Emokpae, Courage, MD       Chlorhexidine Gluconate Cloth 2 % PADS 6 each  6 each Topical Daily Emokpae, Courage, MD   6 each at 08/28/19 0900   dextrose 5 %-0.45 % sodium chloride infusion   Intravenous Continuous ERoxan Hockey MD   Stopped at 08/27/19 2238   loperamide (IMODIUM) capsule 2 mg  2 mg Oral PRN Emokpae, Courage, MD       LORazepam (ATIVAN) injection 0.5 mg  0.5 mg Intravenous Q6H PRN Emokpae, Courage, MD       MEDLINE mouth rinse  15 mL Mouth Rinse BID Emokpae, Courage, MD   15 mL at 08/28/19 0900   ondansetron (ZOFRAN) tablet 4 mg  4 mg Oral Q6H PRN Emokpae, Courage, MD       Or   ondansetron (ZOFRAN) injection 4 mg  4 mg Intravenous Q6H PRN Emokpae, Courage, MD       pantoprazole (PROTONIX) injection 40 mg  40 mg Intravenous Q12H Emokpae, Courage, MD   40 mg at 08/28/19 0900   polyethylene glycol (MIRALAX / GLYCOLAX) packet 17 g  17 g Oral Daily PRN Emokpae, Courage, MD       propranolol (INDERAL) tablet 20 mg  20 mg Oral BID Emokpae, Courage, MD   20 mg at 08/28/19 0900   sertraline (ZOLOFT) tablet 25 mg  25 mg Oral Daily  Emokpae, Courage, MD   25 mg at 08/28/19 0900   sodium chloride flush (NS) 0.9 % injection 3 mL  3 mL Intravenous Q12H Emokpae, Courage, MD   3 mL at 08/28/19 0900   sodium chloride flush (NS) 0.9 % injection 3 mL  3 mL Intravenous PRN Emokpae, Courage, MD       thyroid (ARMOUR) tablet 120 mg  120 mg Oral QLovena Le Courage, MD       [START ON 08/29/2019] thyroid (ARMOUR) tablet 90 mg  90 mg Oral QODAY ERoxan Hockey MD  traZODone (DESYREL) tablet 50 mg  50 mg Oral QHS PRN Roxan Hockey, MD        Allergies as of 08/27/2019 - Review Complete 08/27/2019  Allergen Reaction Noted   Ciprofloxacin Other (See Comments) 06/02/2011   Ciprocin-fluocin-procin [fluocinolone acetonide]  03/22/2011   Codeine  03/22/2011   Demerol  03/22/2011   Penicillins  03/22/2011   Sulfa antibiotics  03/22/2011    History reviewed. No pertinent family history.  Social History   Socioeconomic History   Marital status: Widowed    Spouse name: Not on file   Number of children: Not on file   Years of education: Not on file   Highest education level: Not on file  Occupational History   Not on file  Social Needs   Financial resource strain: Not on file   Food insecurity    Worry: Not on file    Inability: Not on file   Transportation needs    Medical: Not on file    Non-medical: Not on file  Tobacco Use   Smoking status: Never Smoker   Smokeless tobacco: Never Used  Substance and Sexual Activity   Alcohol use: Never    Frequency: Never   Drug use: Never   Sexual activity: Not on file  Lifestyle   Physical activity    Days per week: Not on file    Minutes per session: Not on file   Stress: Not on file  Relationships   Social connections    Talks on phone: Not on file    Gets together: Not on file    Attends religious service: Not on file    Active member of club or organization: Not on file    Attends meetings of clubs or organizations: Not on file     Relationship status: Not on file   Intimate partner violence    Fear of current or ex partner: Not on file    Emotionally abused: Not on file    Physically abused: Not on file    Forced sexual activity: Not on file  Other Topics Concern   Not on file  Social History Narrative   Not on file    Review of Systems: See HPI, all other systems reviewed and are negative  Physical Exam: Vital signs in last 24 hours: Temp:  [97.6 F (36.4 C)-98.1 F (36.7 C)] 97.6 F (36.4 C) (11/11 0749) Pulse Rate:  [71-95] 72 (11/11 0830) Resp:  [14-28] 23 (11/11 0830) BP: (105-165)/(50-96) 115/53 (11/11 0800) SpO2:  [91 %-100 %] 95 % (11/11 0830) Weight:  [46.8 kg] 46.8 kg (11/11 0500) Last BM Date: 08/27/19 General:   Alert frail appearing 85 year old female in no acute distress. Head:  Normocephalic and atraumatic. Eyes:  Sclera clear, no icterus. Conjunctiva pink. Ears:  Normal auditory acuity. Nose:  No deformity, discharge or lesions. Mouth:  No deformity or lesions.  Poor dentition. Neck:  Supple. Lungs: Breath sounds clear throughout posteriorly. Heart: Regular rate and rhythm, soft systolic murmur. Abdomen: Soft, nondistended, positive bowel sounds to all 4 quadrants, lower midline scar intact, no HSM.  The abdomen is not tense.  Questionable ascites. Rectal: No hemorrhoids inflamed, loose golden brown stool without obvious blood, specimen sent to lab for guaiac testing. Msk:  Symmetrical without gross deformities. . Pulses:  Normal pulses noted. Extremities: Bilateral lower extremity 1+ edema. Neurologic:  Alert and  oriented x4;  grossly normal neurologically.  No asterixis. Skin:  Intact without significant lesions or rashes.. Psych:  Alert  and cooperative. Normal mood and affect.  Intake/Output from previous day: 11/10 0701 - 11/11 0700 In: 375.8 [I.V.:60.8; Blood:315] Out: 2600 [Urine:2600] Intake/Output this shift: No intake/output data recorded.  Lab Results: Recent  Labs    08/27/19 1038 08/27/19 1936 08/28/19 0436  WBC 7.3 10.7* 10.8*  HGB 5.3* 8.8* 8.1*  HCT 17.9* 27.4* 25.3*  PLT 166 131* 145*   BMET Recent Labs    08/27/19 1038 08/28/19 0436  NA 139 137  K 3.3* 3.0*  CL 108 103  CO2 23 22  GLUCOSE 118* 101*  BUN 11 10  CREATININE 0.55 0.61  CALCIUM 8.1* 8.1*   LFT Recent Labs    08/27/19 1038  PROT 4.8*  ALBUMIN 2.5*  AST 31  ALT 16  ALKPHOS 50  BILITOT 0.8   PT/INR Recent Labs    08/27/19 1038 08/28/19 0436  LABPROT 16.5* 16.7*  INR 1.4* 1.4*   Hepatitis Panel No results for input(s): HEPBSAG, HCVAB, HEPAIGM, HEPBIGM in the last 72 hours.    Studies/Results: Cxr  Result Date: 08/27/2019 CLINICAL DATA:  Rectal blood EXAM: CHEST - 2 VIEW COMPARISON:  November 04, 2014 FINDINGS: There is mild cardiomegaly. Aortic knob calcifications. No large airspace consolidation or pleural effusion. There is streaky atelectasis or scarring seen at the left lung base. No acute osseous abnormality. IMPRESSION: Mild cardiomegaly. Streaky atelectasis or scarring at the left lung base. Electronically Signed   By: Prudencio Pair M.D.   On: 08/27/2019 21:38   Ct Head Wo Contrast  Result Date: 08/27/2019 CLINICAL DATA:  cHead trauma, minor, GCS>=13, high clinical risk, initial exam, syncope. C-spine trauma, ligamentous injury suspected. EXAM: CT HEAD WITHOUT CONTRAST CT CERVICAL SPINE WITHOUT CONTRAST TECHNIQUE: Multidetector CT imaging of the head and cervical spine was performed following the standard protocol without intravenous contrast. Multiplanar CT image reconstructions of the cervical spine were also generated. COMPARISON:  Head CT 06/13/2008 FINDINGS: CT HEAD FINDINGS Brain: No evidence of acute intracranial hemorrhage. No demarcated cortical infarction. No evidence of intracranial mass. No midline shift or extra-axial fluid collection. Minimal ill-defined hypoattenuation within the cerebral white matter is nonspecific, but consistent  with chronic small vessel ischemic disease. Mild generalized parenchymal atrophy. Vascular: No hyperdense vessel. Skull: Normal. Negative for fracture or focal lesion. Sinuses/Orbits: Visualized orbits demonstrate no acute abnormality. No significant paranasal sinus disease or mastoid effusion. CT CERVICAL SPINE FINDINGS Mildly motion degraded examination. Alignment: No significant spondylolisthesis Skull base and vertebrae: The basion-dental and atlanto-dental intervals are maintained.No evidence of acute fracture to the cervical spine. Soft tissues and spinal canal: No prevertebral fluid or swelling. No visible canal hematoma. Disc levels: Cervical spondylosis with multilevel posterior disc osteophytes, uncovertebral and facet hypertrophy. Upper chest: Emphysema within the imaged lung apices. No visible pneumothorax. Other: Carotid artery atherosclerotic calcification. IMPRESSION: CT head: 1. No evidence of acute intracranial abnormality. 2. Mild generalized parenchymal atrophy and chronic small vessel ischemic disease CT cervical spine: 1. No evidence of acute fracture to the cervical spine. 2. Cervical spondylosis as described. Electronically Signed   By: Kellie Simmering DO   On: 08/27/2019 14:03   Ct Cervical Spine Wo Contrast  Result Date: 08/27/2019 CLINICAL DATA:  Cervical spine trauma secondary to a fall. The patient was found down in the bathroom. EXAM: CT CERVICAL SPINE WITHOUT CONTRAST TECHNIQUE: Multidetector CT imaging of the cervical spine was performed without intravenous contrast. Multiplanar CT image reconstructions were also generated. COMPARISON:  None. FINDINGS: Alignment: Normal. Skull base and vertebrae: No acute fracture.  No primary bone lesion or focal pathologic process. Soft tissues and spinal canal: The prevertebral soft tissues are normal. There is a 15 mm rim calcified nodule in the right lobe of the thyroid gland. No visible canal hematoma. Disc levels: C2-3: Tiny central disc bulge  with no neural impingement. Moderate right facet arthritis. No foraminal stenosis. C3-4: Marked disc space narrowing. Small broad-based disc osteophyte complex most prominent centrally. No foraminal stenosis. C4-5: Chronic disc space narrowing. Small disc bulge with accompanying osteophytes to the left of midline. No foraminal stenosis. C5-6: Chronic disc space narrowing. Slight narrowing of the right neural foramen due to uncinate spurs. No disc bulging or protrusion. C6-7: Disc space narrowing. Minimal endplate osteophytes to the left of midline without neural impingement. Widely patent neural foramina. C7-T1: Normal disc. Slight left facet arthritis. No foraminal stenosis. Upper chest: Negative. Other: None IMPRESSION: 1. No acute abnormality of the cervical spine. Multilevel degenerative disc and joint disease. 2. 15 mm rim calcified nodule in the right lobe of the thyroid gland. Electronically Signed   By: Lorriane Shire M.D.   On: 08/27/2019 13:57   Ct Abdomen Pelvis W Contrast  Result Date: 08/27/2019 CLINICAL DATA:  85 year old female with abdominal pain and rectal bleeding. EXAM: CT ABDOMEN AND PELVIS WITH CONTRAST TECHNIQUE: Multidetector CT imaging of the abdomen and pelvis was performed using the standard protocol following bolus administration of intravenous contrast. CONTRAST:  180m OMNIPAQUE IOHEXOL 300 MG/ML  SOLN COMPARISON:  None. FINDINGS: Lower chest: Partially visualized small bilateral pleural effusions with associated partial compressive atelectasis of the lung bases. Pneumonia is not excluded. Clinical correlation is recommended. There is diffuse interstitial and interlobular septal thickening of the visualized lung bases most consistent with edema. There is mild cardiomegaly. A pericardial effusion is partially visualized measuring approximately 1 cm in thickness. No intra-abdominal free air. There is a moderate size ascites. Hepatobiliary: Nodular liver contour in keeping with  cirrhosis. No intrahepatic biliary ductal dilatation. The gallbladder is unremarkable. Pancreas: Unremarkable. No pancreatic ductal dilatation or surrounding inflammatory changes. Spleen: Normal in size without focal abnormality. Adrenals/Urinary Tract: The right adrenal gland is unremarkable. The left adrenal gland is not well visualized. There is no hydronephrosis on either side. There is symmetric enhancement and excretion of contrast by both kidneys. There is a 4.5 cm left renal inferior pole cyst. The urinary bladder is grossly unremarkable. Stomach/Bowel: There is sigmoid diverticulosis without active inflammatory changes. There is diffuse thickened appearance of the colon which may be related to ascites and hepatic colopathy. Colitis is not excluded. Clinical correlation is recommended. Similarly there is diffuse thickened appearance of the stomach and small bowel likely related to ascites and liver disease. There is a small hiatal hernia with evidence of gastroesophageal reflux. The appendix is not visualized with certainty. Vascular/Lymphatic: There is advanced aortoiliac atherosclerotic disease. The IVC is unremarkable. No portal venous gas. There is no adenopathy. The SMV, splenic vein, and main portal vein are patent. Reproductive: Hysterectomy. No adnexal masses. Other: Diffuse subcutaneous edema and anasarca. Musculoskeletal: Degenerative changes of the spine. No acute osseous pathology. IMPRESSION: 1. Cirrhosis with evidence of portal hypertension, moderate ascites, and anasarca. 2. Thickened appearance of the colon and small bowel likely related to ascites and liver disease. Colitis is not excluded. Clinical correlation is recommended. 3. Sigmoid diverticulosis. 4. Cardiomegaly with findings of CHF. Partially visualized small bilateral pleural effusions with associated partial compressive atelectasis of the lung bases. Pneumonia is not excluded. Clinical correlation is recommended. Aortic  Atherosclerosis (ICD10-I70.0).  Electronically Signed   By: Anner Crete M.D.   On: 08/27/2019 14:01    IMPRESSION/PLAN:  63.  85 year old female with acute anemia secondary to lower GI bleed.  Most likely diverticular bleed. -Monitor H&H every 12 hours -Transfuse for hemoglobin less than 8 -Continue Protonix 40 mg IV every 12 hours -Post likely will have EGD/colonoscopy tomorrow.  I discussed a EGD/colonoscopy with the patient and she consents to have done.  EGD/Colonoscopy benefits and risk discussed including risk of sedation, bleeding, perforation and infection.  Further recommendations per Dr. Dorien Chihuahua later today. -No obvious upper GI bleeding, esophageal varices were not seen on CT, I discussed octreotide with Dr. Dorien Chihuahua and it was decided to hold off the octreotide for now.  2.  Cirrhosis.  CT abdomen pelvis consistent with cirrhosis with ascites. -Would recommend Rocephin 1 g IV every 24 hours for SBP prophylaxis, further recommendations per Dr. Dorien Chihuahua. -Await abdominal ultrasound result, to consider a paracentesis to include cell count with differential, aerobic and anaerobic culture, Gram stain, albumin, protein and cytology -Hepatitis B surface antigen, hepatitis B core IgG G, hepatitis B surface antibody, hepatitis A IgG total, hepatitis C antibody, ANA, SMA, AMA, iron, ferritin, alpha-1 antitrypsin, ceruloplasmin and IgG  3.  Chronic diarrhea -Monitor electrolytes, check GI panel and C. difficile PCR if diarrhea recurs during this admission  4.  Hypothyroidism  Patrecia Pour Kennedy-Smith  08/28/2019, 9:04 AM

## 2019-08-28 NOTE — Progress Notes (Addendum)
Pt c/o RLS-asking for medication for it. Dr. Scherrie November paged to make aware. Will continue to monitor pt.  MD stated to give the Trazadone- adv that pt has refused it three times but I will ask patient again.

## 2019-08-28 NOTE — Progress Notes (Signed)
RN spoke with patients daughter who is upset because pt has Sjogrens syndrome and is not able to have any water. RN adv daughter that patient has had a couple sips of water and ice chips through out the shift.  Pt states that no one is listening to her and that the patient needs her eye drops. RN adv that no eye drops are on patients home medication list and that is why no eye drops were ordered for her mother. Pts daughter demanding to be let in earlier than 10am because she wants to speak to the doctor. Adv patient that no one is able to visit until 10am and reminded of hospital policy hours. Pt verbalized understanding.  Please call daughter and advise of any information that will tell her of what is going on with her mother is MD comes in room and daughter is not there.   Thank you!

## 2019-08-28 NOTE — Progress Notes (Addendum)
PROGRESS NOTE    [name]  HYQ:[id number] DOB: [birth year] DOA: 08/27/2019 PCP: Assunta Found, MD    Brief Narrative:  85 year old female who presented with rectal bleeding.  She does have the significant past medical history for chronic diarrhea, hypothyroidism and Sjogren's.  Reported intermittent bloody diarrhea for about 5 to 6 days, more intense over the last 24 hours.  On the day of admission she had a syncope episode.  On her initial physical examination blood pressure 152/75, heart rate 80, temperature 97.9, respiratory rate 16, oxygen saturation 95%.  She had a forehead abrasion without frank hematoma, her lungs were clear to auscultation bilaterally, heart S1 and S2 present and rhythmic, her abdomen had generalized discomfort, no rebound, no guarding, no lower extremity edema. White count 7.8, hemoglobin 5.3, hematocrit 17.9, platelets 166.  Patient was admitted to the hospital with a working diagnosis of acute blood loss anemia due to suspected gastrointestinal bleed/lower GI tract.   Assessment & Plan:   Principal Problem:   Symptomatic anemia Active Problems:   Diarrhea   Portal hypertension --- liver cirrhosis   Cirrhosis of liver with ascites and portal hypertension  1. Acute blood loss anemia due to gastrointestinal bleeding, suspected lower. Patient tolerated well PRBC transfusion 2 units, follow up Hgb is 8,1 with Hct at 25.3. Her blood pressure has been stable 113 to 115 systolic with a resting HR of 71. Continue NPO and proton pump inhibitors, follow with GI recommendations.   Syncope likely due to acute anemia, echocardiogram with preserved LV systolic function 60 to 65% with no significant valvular disease.   2. Liver cirrhosis. CT with evidence of portal hypertension and cirrhosis, along with ascites, follow on abdominal US. Patient likely will need upper endoscopy as part of the work up for acute anemia. Now on atenolol.   3. Hypothyroid. Continue with  levothyroxine.   4. Depression. Continue with sertraline and trazodone.   5. Hypokalemia. Continue K correction with Kcl IV 40 meq and follow renal panel in am, check Mg.   DVT prophylaxis: scd   Code Status:  full Family Communication: no family at the bedside  Disposition Plan/ discharge barriers:  Pending clinical improvement.   Body mass index is 20.15 kg/m. Malnutrition Type:      Malnutrition Characteristics:      Nutrition Interventions:     RN Pressure Injury Documentation:     Consultants:   GI   Procedures:     Antimicrobials:       Subjective: Patient feeling very weak and deconditioned, no nausea or vomiting, and no further bleeding. No chest pain or dyspnea and tolerated PRBC transfusion well.   Objective: Vitals:   08/28/19 0400 08/28/19 0500 08/28/19 0600 08/28/19 0749  BP: (!) 115/55 (!) 112/56 (!) 110/53   Pulse: 71 72 71   Resp: (!) 22 19 18    Temp: 98.1 F (36.7 C)   97.6 F (36.4 C)  TempSrc: Oral   Oral  SpO2: 91% 92% 94%   Weight:  46.8 kg    Height:        Intake/Output Summary (Last 24 hours) at 08/28/2019 0804 Last data filed at 08/28/2019 13/08/2019 Gross per 24 hour  Intake 375.83 ml  Output 2600 ml  Net -2224.17 ml   Filed Weights   08/27/19 0850 08/27/19 2304 08/28/19 0500  Weight: 46.7 kg 46.8 kg 46.8 kg    Examination:   General: Not in pain or dyspnea, deconditioned  Neurology: Awake and alert, non  focal  E ENT: positive pallor, no icterus, oral mucosa moist Cardiovascular: No JVD. S1-S2 present, rhythmic, no gallops, rubs, or murmurs. No lower extremity edema. Pulmonary: positive breath sounds bilaterally, adequate air movement, no wheezing, rhonchi or rales. Gastrointestinal. Abdomen with no organomegaly, non tender, no rebound or guarding Skin. No rashes Musculoskeletal: no joint deformities     Data Reviewed: I have personally reviewed following labs and imaging studies  CBC: Recent Labs  Lab  08/27/19 1038 08/27/19 1936 08/28/19 0436  WBC 7.3 10.7* 10.8*  NEUTROABS 6.0  --   --   HGB 5.3* 8.8* 8.1*  HCT 17.9* 27.4* 25.3*  MCV 95.7 93.5 92.3  PLT 166 131* 188*   Basic Metabolic Panel: Recent Labs  Lab 08/27/19 1038 08/28/19 0436  NA 139 137  K 3.3* 3.0*  CL 108 103  CO2 23 22  GLUCOSE 118* 101*  BUN 11 10  CREATININE 0.55 0.61  CALCIUM 8.1* 8.1*   GFR: Estimated Creatinine Clearance: 37.6 mL/min (by C-G formula based on SCr of 0.61 mg/dL). Liver Function Tests: Recent Labs  Lab 08/27/19 1038  AST 31  ALT 16  ALKPHOS 50  BILITOT 0.8  PROT 4.8*  ALBUMIN 2.5*   No results for input(s): LIPASE, AMYLASE in the last 168 hours. No results for input(s): AMMONIA in the last 168 hours. Coagulation Profile: Recent Labs  Lab 08/27/19 1038 08/28/19 0436  INR 1.4* 1.4*   Cardiac Enzymes: Recent Labs  Lab 08/27/19 1042  CKTOTAL 57   BNP (last 3 results) No results for input(s): PROBNP in the last 8760 hours. HbA1C: No results for input(s): HGBA1C in the last 72 hours. CBG: No results for input(s): GLUCAP in the last 168 hours. Lipid Profile: No results for input(s): CHOL, HDL, LDLCALC, TRIG, CHOLHDL, LDLDIRECT in the last 72 hours. Thyroid Function Tests: Recent Labs    08/27/19 1950  TSH 16.487*   Anemia Panel: No results for input(s): VITAMINB12, FOLATE, FERRITIN, TIBC, IRON, RETICCTPCT in the last 72 hours.    Radiology Studies: I have reviewed all of the imaging during this hospital visit personally     Scheduled Meds: . Chlorhexidine Gluconate Cloth  6 each Topical Daily  . mouth rinse  15 mL Mouth Rinse BID  . pantoprazole (PROTONIX) IV  40 mg Intravenous Q12H  . propranolol  20 mg Oral BID  . sertraline  25 mg Oral Daily  . sodium chloride flush  3 mL Intravenous Q12H  . thyroid  120 mg Oral QODAY  . [START ON 08/29/2019] thyroid  90 mg Oral QODAY   Continuous Infusions: . sodium chloride    . dextrose 5 % and 0.45% NaCl  Stopped (08/27/19 2238)     LOS: 1 day        Jamaal Bernasconi Gerome Apley, MD

## 2019-08-28 NOTE — Evaluation (Signed)
Physical Therapy Evaluation Patient Details Name: [name] MRN: [id number] DOB: 1933/06/04 Today's Date: 08/28/2019   History of Present Illness  [name]  is a 85 y.o. female with past medical history relevant for  chronic diarrhea, hypothyroidism and Sjogren's who presents to the ED after passing out at home in the setting of rectal bleeding    Clinical Impression  Patient presents with generalized weakness and fatigue. Patient's daughter was present at evaluation. Patient presents supine in bed, currently with 2L O2 via nasal cannula, bilateral SCDs on LEs. Patient is currently on dietary restriction, which may likely be contributory to current level of fatigue. Patient demos slow labored movement with bed mobility, and required Min guard assist for supine to sit. Patient tolerated sitting at EOB well with baseline SPO2 at 96%. Patient performed sit to stand with Min guard, SpO2 dropped to 92%. Patient was able to stand for about 30 seconds, with no AD, no LOB, close supervision, before returning to bed due to fatigue, and urge of impending bowel movement. Ambulation not assessed this date, due to level of fatigue. Patient returned to supine in bed, with daughter and nursing staff present. Patient will benefit from continued physical therapy in hospital and recommended venue below to increase strength, balance, endurance for safe ADLs and gait.      Follow Up Recommendations SNF;Supervision/Assistance - 24 hour;Supervision for mobility/OOB    Equipment Recommendations  Rolling walker with 5" wheels    Recommendations for Other Services       Precautions / Restrictions Precautions Precautions: Fall Restrictions Weight Bearing Restrictions: No      Mobility  Bed Mobility Overal bed mobility: Needs Assistance Bed Mobility: Supine to Sit     Supine to sit: Min guard;Min assist     General bed mobility comments: Required use of hand rail or HHA, slow labored  movement  Transfers Overall transfer level: Needs assistance   Transfers: Sit to/from Stand Sit to Stand: Min guard;Supervision         General transfer comment: Slow labored movement  Ambulation/Gait             General Gait Details: Did not attempt ambulation this date due to level of fatigue, will assess at next visit  Stairs            Wheelchair Mobility    Modified Rankin (Stroke Patients Only)       Balance Overall balance assessment: Needs assistance Sitting-balance support: No upper extremity supported;Feet supported Sitting balance-Leahy Scale: Good Sitting balance - Comments: Seated at EOB   Standing balance support: No upper extremity supported Standing balance-Leahy Scale: Fair Standing balance comment: Standing at bedside, no AD. Decreased tolerance due to increased fatigue                             Pertinent Vitals/Pain Pain Assessment: No/denies pain    Home Living Family/patient expects to be discharged to:: Private residence Living Arrangements: Alone Available Help at Discharge: Family;Available 24 hours/day Type of Home: House Home Access: Stairs to enter Entrance Stairs-Rails: None Entrance Stairs-Number of Steps: 1 Home Layout: One level Home Equipment: None      Prior Function Level of Independence: Independent         Comments: Patient describes she was limited community ambulator prior to admission, but with assist from her daughter for shopping needs and DR appointments. Reports she was ambulatory in home with no AD  Hand Dominance        Extremity/Trunk Assessment   Upper Extremity Assessment Upper Extremity Assessment: Generalized weakness    Lower Extremity Assessment Lower Extremity Assessment: Generalized weakness    Cervical / Trunk Assessment Cervical / Trunk Assessment: Kyphotic  Communication   Communication: No difficulties  Cognition Arousal/Alertness: Awake/alert Behavior  During Therapy: WFL for tasks assessed/performed Overall Cognitive Status: Within Functional Limits for tasks assessed                                        General Comments      Exercises     Assessment/Plan    PT Assessment Patient needs continued PT services  PT Problem List Decreased strength;Decreased activity tolerance;Decreased balance;Decreased mobility       PT Treatment Interventions DME instruction;Gait training;Functional mobility training;Therapeutic activities;Therapeutic exercise;Patient/family education;Neuromuscular re-education    PT Goals (Current goals can be found in the Care Plan section)  Acute Rehab PT Goals Patient Stated Goal: Return home PT Goal Formulation: With patient/family Time For Goal Achievement: 09/11/19 Potential to Achieve Goals: Good    Frequency Min 3X/week   Barriers to discharge        Co-evaluation               AM-PAC PT "6 Clicks" Mobility  Outcome Measure Help needed turning from your back to your side while in a flat bed without using bedrails?: A Little Help needed moving from lying on your back to sitting on the side of a flat bed without using bedrails?: A Little Help needed moving to and from a bed to a chair (including a wheelchair)?: A Little Help needed standing up from a chair using your arms (e.g., wheelchair or bedside chair)?: A Little Help needed to walk in hospital room?: A Lot Help needed climbing 3-5 steps with a railing? : A Lot 6 Click Score: 16    End of Session Equipment Utilized During Treatment: Oxygen Activity Tolerance: Patient limited by fatigue Patient left: in bed;with family/visitor present;with SCD's reapplied;with call bell/phone within reach;with nursing/sitter in room Nurse Communication: Mobility status PT Visit Diagnosis: Unsteadiness on feet (R26.81);Muscle weakness (generalized) (M62.81);Other abnormalities of gait and mobility (R26.89);Difficulty in walking, not  elsewhere classified (R26.2)    Time: 2979-8921 PT Time Calculation (min) (ACUTE ONLY): 30 min   Charges:   PT Evaluation $PT Eval Moderate Complexity: 1 Mod        3:43 PM, 08/28/19 Josue Hector PT DPT  Physical Therapist with [facility]  [phone]

## 2019-08-28 NOTE — Progress Notes (Signed)
Assisted tele visit to patient with daughter.  Julie Mcclees Anderson, RN   

## 2019-08-28 NOTE — Progress Notes (Signed)
*  PRELIMINARY RESULTS* Echocardiogram 2D Echocardiogram has been performed.  [name] 08/28/2019, 10:58 AM

## 2019-08-28 NOTE — Progress Notes (Signed)
Pt and daughter refused to sign consent form for colonoscopy and EGD tomorrow until they speak with Dr.Rehman again.  Pt did agree to take Trazadone tonight for rest d/t not sleeping in 3 days. Pt dropped first Trazadone on floor-wasted in pyxis. Even though did not need a witness Gershon Cull, RN saw Chief Operating Officer. Pt given another pill and took medication.

## 2019-08-29 LAB — CBC WITH DIFFERENTIAL/PLATELET
Abs Immature Granulocytes: 0.05 10*3/uL (ref 0.00–0.07)
Basophils Absolute: 0 10*3/uL (ref 0.0–0.1)
Basophils Relative: 0 %
Eosinophils Absolute: 0.1 10*3/uL (ref 0.0–0.5)
Eosinophils Relative: 1 %
HCT: 28.8 % — ABNORMAL LOW (ref 36.0–46.0)
Hemoglobin: 8.9 g/dL — ABNORMAL LOW (ref 12.0–15.0)
Immature Granulocytes: 0 %
Lymphocytes Relative: 7 %
Lymphs Abs: 0.8 10*3/uL (ref 0.7–4.0)
MCH: 29.6 pg (ref 26.0–34.0)
MCHC: 30.9 g/dL (ref 30.0–36.0)
MCV: 95.7 fL (ref 80.0–100.0)
Monocytes Absolute: 0.9 10*3/uL (ref 0.1–1.0)
Monocytes Relative: 8 %
Neutro Abs: 9.5 10*3/uL — ABNORMAL HIGH (ref 1.7–7.7)
Neutrophils Relative %: 84 %
Platelets: 173 10*3/uL (ref 150–400)
RBC: 3.01 MIL/uL — ABNORMAL LOW (ref 3.87–5.11)
RDW: 19.9 % — ABNORMAL HIGH (ref 11.5–15.5)
WBC: 11.5 10*3/uL — ABNORMAL HIGH (ref 4.0–10.5)
nRBC: 0 % (ref 0.0–0.2)

## 2019-08-29 LAB — BASIC METABOLIC PANEL
Anion gap: 7 (ref 5–15)
BUN: 11 mg/dL (ref 8–23)
CO2: 19 mmol/L — ABNORMAL LOW (ref 22–32)
Calcium: 7.8 mg/dL — ABNORMAL LOW (ref 8.9–10.3)
Chloride: 105 mmol/L (ref 98–111)
Creatinine, Ser: 0.68 mg/dL (ref 0.44–1.00)
GFR calc Af Amer: >60 mL/min (ref >60)
GFR calc non Af Amer: >60 mL/min (ref >60)
Glucose, Bld: 110 mg/dL — ABNORMAL HIGH (ref 70–99)
Potassium: 3.3 mmol/L — ABNORMAL LOW (ref 3.5–5.1)
Sodium: 131 mmol/L — ABNORMAL LOW (ref 135–145)

## 2019-08-29 LAB — FERRITIN: Ferritin: 47 ng/mL (ref 11–307)

## 2019-08-29 LAB — MAGNESIUM: Magnesium: 1.6 mg/dL — ABNORMAL LOW (ref 1.7–2.4)

## 2019-08-29 LAB — IRON AND TIBC
Iron: 19 ug/dL — ABNORMAL LOW (ref 28–170)
Saturation Ratios: 5 % — ABNORMAL LOW (ref 10.4–31.8)
TIBC: 400 ug/dL (ref 250–450)
UIBC: 381 ug/dL

## 2019-08-29 LAB — HEPATITIS B CORE ANTIBODY, TOTAL: Hep B Core Total Ab: NONREACTIVE

## 2019-08-29 LAB — HEPATITIS A ANTIBODY, TOTAL: hep A Total Ab: REACTIVE — AB

## 2019-08-29 LAB — SEDIMENTATION RATE: Sed Rate: 8 mm/hr (ref 0–22)

## 2019-08-29 LAB — T3, FREE: T3, Free: 2.3 pg/mL (ref 2.0–4.4)

## 2019-08-29 LAB — HEPATITIS C ANTIBODY: HCV Ab: NONREACTIVE

## 2019-08-29 LAB — HEPATITIS B SURFACE ANTIGEN: Hepatitis B Surface Ag: NONREACTIVE

## 2019-08-29 MED ORDER — SODIUM CHLORIDE 0.9 % IV BOLUS
500.0000 mL | Freq: Once | INTRAVENOUS | Status: AC
  Administered 2019-08-29: 02:00:00 500 mL via INTRAVENOUS

## 2019-08-29 MED ORDER — POTASSIUM CHLORIDE 10 MEQ/100ML IV SOLN
10.0000 meq | INTRAVENOUS | Status: AC
  Administered 2019-08-29 (×4): 10 meq via INTRAVENOUS
  Filled 2019-08-29 (×4): qty 100

## 2019-08-29 MED ORDER — MAGNESIUM SULFATE 2 GM/50ML IV SOLN
2.0000 g | Freq: Once | INTRAVENOUS | Status: AC
  Administered 2019-08-29: 10:00:00 2 g via INTRAVENOUS
  Filled 2019-08-29: qty 50

## 2019-08-29 MED ORDER — THYROID 30 MG PO TABS
120.0000 mg | ORAL_TABLET | Freq: Every day | ORAL | Status: DC
  Administered 2019-08-30 – 2019-09-01 (×3): 120 mg via ORAL
  Filled 2019-08-29 (×4): qty 4

## 2019-08-29 MED ORDER — THYROID 30 MG PO TABS
30.0000 mg | ORAL_TABLET | Freq: Once | ORAL | Status: AC
  Administered 2019-08-29: 30 mg via ORAL
  Filled 2019-08-29: qty 1

## 2019-08-29 NOTE — Progress Notes (Signed)
After speaking with Dr. Darrick Meigs- new order for foley catheter placement d/t patient having procedures today.  Pt is very upset about foley catheter at this time, refusing to take thyroid medication. Will reschedule for a later time.

## 2019-08-29 NOTE — Progress Notes (Signed)
Last charted urine output charted was from 11/11 @ 0625-868ml output from purewick. Pt has not voided since then. RN bladder scanned patient to show 368 of urine in bladder. Dr. Darrick Meigs paged and made aware. New order for STAT BMP and 561mL Saline bolus.   Pt acting confused and restless. Only given Trazadone tonight for sleep @ 2130. Dr.Lama made aware of this as well.  Will continue to monitor pt

## 2019-08-29 NOTE — Progress Notes (Signed)
PROGRESS NOTE    MAN BONNEAU  QBH:[id number] DOB: [birth year] DOA: 08/27/2019 PCP: Sharilyn Sites, MD    Brief Narrative:  85 year old female who presented with rectal bleeding.  She does have the significant past medical history for chronic diarrhea, hypothyroidism and Sjogren's.  Reported intermittent bloody diarrhea for about 5 to 6 days, more intense over the last 24 hours.  On the day of admission she had a syncope episode.  On her initial physical examination blood pressure 152/75, heart rate 80, temperature 97.9, respiratory rate 16, oxygen saturation 95%.  She had a forehead abrasion without frank hematoma, her lungs were clear to auscultation bilaterally, heart S1 and S2 present and rhythmic, her abdomen had generalized discomfort, no rebound, no guarding, no lower extremity edema. White count 7.8, hemoglobin 5.3, hematocrit 17.9, platelets 166.  Patient was admitted to the hospital with a working diagnosis of acute blood loss anemia due to suspected gastrointestinal bleed/lower GI tract.   Assessment & Plan:   Principal Problem:   Symptomatic anemia Active Problems:   Diarrhea   Portal hypertension --- liver cirrhosis   Cirrhosis of liver with ascites and portal hypertension  1. Acute blood loss anemia due to gastrointestinal bleeding, suspected lower.  Initial hemoglobin of 5.3.  She was transfused 2 units PRBC with improvement of hemoglobin.  Current hemoglobin 8.9.  GI following and plans on EGD/colonoscopy in a.m. since bowel prep was poor today.  Continue on Protonix.  Syncope likely due to acute anemia, echocardiogram with preserved LV systolic function 60 to 09% with no significant valvular disease.   2. Liver cirrhosis. CT with evidence of portal hypertension and cirrhosis, along with ascites.  Plan is for screening EGD tomorrow to evaluate for varices.  3. Hypothyroid. Continue with Armour Thyroid.   4. Depression. Continue with sertraline   5. Hypokalemia.   Replace potassium, magnesium is also low and will be replaced  DVT prophylaxis: scd   Code Status:  full Family Communication: no family at the bedside  Disposition Plan/ discharge barriers:  Pending clinical improvement.   Body mass index is 20.45 kg/m. Malnutrition Type:      Malnutrition Characteristics:      Nutrition Interventions:     RN Pressure Injury Documentation:     Consultants:   GI   Procedures:     Antimicrobials:   Ceftriaxone 11/11 >   Subjective: Patient denies any shortness of breath or chest pain.  No abdominal pain.  Objective: Vitals:   08/29/19 1500 08/29/19 1600 08/29/19 1614 08/29/19 1800  BP: (!) 124/59 124/71  123/89  Pulse: 70   77  Resp: (!) 21 19  17   Temp:   (!) 97.4 F (36.3 C)   TempSrc:   Oral   SpO2: 100%   99%  Weight:      Height:        Intake/Output Summary (Last 24 hours) at 08/29/2019 1948 Last data filed at 08/29/2019 1526 Gross per 24 hour  Intake 2406.72 ml  Output 952 ml  Net 1454.72 ml   Filed Weights   08/27/19 2304 08/28/19 0500 08/29/19 0500  Weight: 46.8 kg 46.8 kg 47.5 kg    Examination:   General exam: Alert, awake, oriented x 3 Respiratory system: Clear to auscultation. Respiratory effort normal. Cardiovascular system:RRR. No murmurs, rubs, gallops. Gastrointestinal system: Abdomen is nondistended, soft and nontender. No organomegaly or masses felt. Normal bowel sounds heard. Central nervous system: Alert and oriented. No focal neurological deficits. Extremities: No C/C/E, +pedal  pulses Skin: No rashes, lesions or ulcers Psychiatry: Judgement and insight appear normal. Mood & affect appropriate.    Data Reviewed: I have personally reviewed following labs and imaging studies  CBC: Recent Labs  Lab 08/27/19 1038 08/27/19 1936 08/28/19 0436 08/29/19 0431  WBC 7.3 10.7* 10.8* 11.5*  NEUTROABS 6.0  --   --  9.5*  HGB 5.3* 8.8* 8.1* 8.9*  HCT 17.9* 27.4* 25.3* 28.8*  MCV 95.7  93.5 92.3 95.7  PLT 166 131* 145* 173   Basic Metabolic Panel: Recent Labs  Lab 08/27/19 1038 08/28/19 0436 08/29/19 0230 08/29/19 0431  NA 139 137 131*  --   K 3.3* 3.0* 3.3*  --   CL 108 103 105  --   CO2 23 22 19*  --   GLUCOSE 118* 101* 110*  --   BUN 11 10 11   --   CREATININE 0.55 0.61 0.68  --   CALCIUM 8.1* 8.1* 7.8*  --   MG  --   --   --  1.6*   GFR: Estimated Creatinine Clearance: 37.6 mL/min (by C-G formula based on SCr of 0.68 mg/dL). Liver Function Tests: Recent Labs  Lab 08/27/19 1038  AST 31  ALT 16  ALKPHOS 50  BILITOT 0.8  PROT 4.8*  ALBUMIN 2.5*   No results for input(s): LIPASE, AMYLASE in the last 168 hours. No results for input(s): AMMONIA in the last 168 hours. Coagulation Profile: Recent Labs  Lab 08/27/19 1038 08/28/19 0436  INR 1.4* 1.4*   Cardiac Enzymes: Recent Labs  Lab 08/27/19 1042  CKTOTAL 57   BNP (last 3 results) No results for input(s): PROBNP in the last 8760 hours. HbA1C: No results for input(s): HGBA1C in the last 72 hours. CBG: No results for input(s): GLUCAP in the last 168 hours. Lipid Profile: No results for input(s): CHOL, HDL, LDLCALC, TRIG, CHOLHDL, LDLDIRECT in the last 72 hours. Thyroid Function Tests: Recent Labs    08/27/19 1950 08/28/19 0436  TSH 16.487*  --   FREET4  --  0.76  T3FREE  --  2.3   Anemia Panel: Recent Labs    08/29/19 0431  FERRITIN 47  TIBC 400  IRON 19*      Radiology Studies: I have reviewed all of the imaging during this hospital visit personally     Scheduled Meds: . Chlorhexidine Gluconate Cloth  6 each Topical Daily  . mouth rinse  15 mL Mouth Rinse BID  . pantoprazole (PROTONIX) IV  40 mg Intravenous Q12H  . sertraline  25 mg Oral Daily  . sodium chloride flush  3 mL Intravenous Q12H  . [START ON 08/30/2019] thyroid  120 mg Oral Q0600   Continuous Infusions: . sodium chloride    . cefTRIAXone (ROCEPHIN)  IV 1 g (08/29/19 1843)  . dextrose 5 % and 0.45%  NaCl 50 mL/hr at 08/29/19 1500     LOS: 2 days        13/12/20, MD

## 2019-08-29 NOTE — Progress Notes (Signed)
Pt still has not urinated after 500 mL bolus- RN bladder scanned pt and bladder showed 592 in bladder. Dr. Darrick Meigs paged to make aware. Waiting for orders/call back. Will continue to monitor pt

## 2019-08-29 NOTE — Progress Notes (Addendum)
According to patient's daughter [name], patient has been confused this morning. Patient was given trazodone 50 mg at bedtime to help her sleep. Patient drinking GoLYTELY very slowly.  She only had 1 bowel movement and passed mushy stool. According to nursing staff she did not have any reaction with ceftriaxone.  H&H 8.9 and 28.8 and platelet count 170 3K Sed rate is 8. Serum iron 19 TIBC 400 and saturation 5% Serum ferritin 47. Multiple other studies pending.  Iron studies consistent with iron deficiency anemia.  I do not believe patient would be adequately prepped for colonoscopy today. Will reschedule patient for EGD and colonoscopy tomorrow. Patient can have clear liquids along with her prep. Trazodone discontinued.

## 2019-08-30 ENCOUNTER — Encounter (HOSPITAL_COMMUNITY)
Admission: EM | Disposition: A | Discharge status: Home / Self Care | Payer: Self-pay | Source: Home / Self Care | Attending: Internal Medicine

## 2019-08-30 ENCOUNTER — Inpatient Hospital Stay (HOSPITAL_COMMUNITY): Payer: Medicare Other | Admitting: Anesthesiology

## 2019-08-30 ENCOUNTER — Encounter (HOSPITAL_COMMUNITY): Payer: Self-pay | Admitting: *Deleted

## 2019-08-30 DIAGNOSIS — K449 Diaphragmatic hernia without obstruction or gangrene: Secondary | ICD-10-CM

## 2019-08-30 DIAGNOSIS — K228 Other specified diseases of esophagus: Secondary | ICD-10-CM

## 2019-08-30 DIAGNOSIS — D509 Iron deficiency anemia, unspecified: Secondary | ICD-10-CM

## 2019-08-30 DIAGNOSIS — K5730 Diverticulosis of large intestine without perforation or abscess without bleeding: Secondary | ICD-10-CM

## 2019-08-30 DIAGNOSIS — K5520 Angiodysplasia of colon without hemorrhage: Secondary | ICD-10-CM

## 2019-08-30 DIAGNOSIS — K6289 Other specified diseases of anus and rectum: Secondary | ICD-10-CM

## 2019-08-30 DIAGNOSIS — K644 Residual hemorrhoidal skin tags: Secondary | ICD-10-CM

## 2019-08-30 DIAGNOSIS — K766 Portal hypertension: Secondary | ICD-10-CM

## 2019-08-30 DIAGNOSIS — K3189 Other diseases of stomach and duodenum: Secondary | ICD-10-CM

## 2019-08-30 DIAGNOSIS — K625 Hemorrhage of anus and rectum: Secondary | ICD-10-CM

## 2019-08-30 HISTORY — PX: COLONOSCOPY WITH PROPOFOL: SHX5780

## 2019-08-30 HISTORY — PX: ESOPHAGOGASTRODUODENOSCOPY (EGD) WITH PROPOFOL: SHX5813

## 2019-08-30 LAB — CBC
HCT: 24.8 % — ABNORMAL LOW (ref 36.0–46.0)
Hemoglobin: 7.6 g/dL — ABNORMAL LOW (ref 12.0–15.0)
MCH: 29.7 pg (ref 26.0–34.0)
MCHC: 30.6 g/dL (ref 30.0–36.0)
MCV: 96.9 fL (ref 80.0–100.0)
Platelets: 157 10*3/uL (ref 150–400)
RBC: 2.56 MIL/uL — ABNORMAL LOW (ref 3.87–5.11)
RDW: 18.9 % — ABNORMAL HIGH (ref 11.5–15.5)
WBC: 7.8 10*3/uL (ref 4.0–10.5)
nRBC: 0 % (ref 0.0–0.2)

## 2019-08-30 LAB — COMPREHENSIVE METABOLIC PANEL
ALT: 18 U/L (ref 0–44)
AST: 32 U/L (ref 15–41)
Albumin: 2.3 g/dL — ABNORMAL LOW (ref 3.5–5.0)
Alkaline Phosphatase: 50 U/L (ref 38–126)
Anion gap: 6 (ref 5–15)
BUN: 8 mg/dL (ref 8–23)
CO2: 23 mmol/L (ref 22–32)
Calcium: 8.1 mg/dL — ABNORMAL LOW (ref 8.9–10.3)
Chloride: 106 mmol/L (ref 98–111)
Creatinine, Ser: 0.73 mg/dL (ref 0.44–1.00)
GFR calc Af Amer: >60 mL/min (ref >60)
GFR calc non Af Amer: >60 mL/min (ref >60)
Glucose, Bld: 121 mg/dL — ABNORMAL HIGH (ref 70–99)
Potassium: 4.5 mmol/L (ref 3.5–5.1)
Sodium: 135 mmol/L (ref 135–145)
Total Bilirubin: 0.7 mg/dL (ref 0.3–1.2)
Total Protein: 4.7 g/dL — ABNORMAL LOW (ref 6.5–8.1)

## 2019-08-30 LAB — IGG, IGA, IGM
IgA: 314 mg/dL (ref 64–422)
IgG (Immunoglobin G), Serum: 738 mg/dL (ref 586–1602)
IgM (Immunoglobulin M), Srm: 108 mg/dL (ref 26–217)

## 2019-08-30 LAB — ALPHA-1-ANTITRYPSIN: A-1 Antitrypsin, Ser: 191 mg/dL — ABNORMAL HIGH (ref 101–187)

## 2019-08-30 LAB — HEPATITIS B SURFACE ANTIBODY, QUANTITATIVE: Hep B S AB Quant (Post): 15.5 m[IU]/mL (ref >9.9)

## 2019-08-30 LAB — PREPARE RBC (CROSSMATCH)

## 2019-08-30 LAB — HEMOGLOBIN AND HEMATOCRIT, BLOOD
HCT: 33.1 % — ABNORMAL LOW (ref 36.0–46.0)
Hemoglobin: 10.3 g/dL — ABNORMAL LOW (ref 12.0–15.0)

## 2019-08-30 LAB — AFP TUMOR MARKER: AFP, Serum, Tumor Marker: 1.7 ng/mL (ref 0.0–8.3)

## 2019-08-30 SURGERY — ESOPHAGOGASTRODUODENOSCOPY (EGD) WITH PROPOFOL
Anesthesia: General

## 2019-08-30 MED ORDER — PROPOFOL 500 MG/50ML IV EMUL
INTRAVENOUS | Status: DC | PRN
  Administered 2019-08-30: 50 ug/kg/min via INTRAVENOUS

## 2019-08-30 MED ORDER — EPHEDRINE 5 MG/ML INJ
INTRAVENOUS | Status: AC
  Filled 2019-08-30: qty 10

## 2019-08-30 MED ORDER — PROMETHAZINE HCL 25 MG/ML IJ SOLN: 6.2500 mg | INTRAMUSCULAR | Status: DC | PRN

## 2019-08-30 MED ORDER — SODIUM CHLORIDE 0.9% IV SOLUTION
Freq: Once | INTRAVENOUS | Status: AC
  Administered 2019-08-30: 11:00:00 via INTRAVENOUS

## 2019-08-30 MED ORDER — LACTATED RINGERS IV SOLN: INTRAVENOUS | Status: DC

## 2019-08-30 MED ORDER — PHENYLEPHRINE 40 MCG/ML (10ML) SYRINGE FOR IV PUSH (FOR BLOOD PRESSURE SUPPORT)
PREFILLED_SYRINGE | INTRAVENOUS | Status: AC
  Filled 2019-08-30: qty 10

## 2019-08-30 MED ORDER — NYSTATIN-TRIAMCINOLONE 100000-0.1 UNIT/GM-% EX CREA
TOPICAL_CREAM | Freq: Two times a day (BID) | CUTANEOUS | Status: DC
  Administered 2019-08-30 – 2019-09-01 (×4): via TOPICAL
  Filled 2019-08-30 (×2): qty 15

## 2019-08-30 MED ORDER — KETAMINE HCL 10 MG/ML IJ SOLN
INTRAMUSCULAR | Status: DC | PRN
  Administered 2019-08-30: 5 mg via INTRAVENOUS
  Administered 2019-08-30: 10 mg via INTRAVENOUS
  Administered 2019-08-30: 5 mg via INTRAVENOUS

## 2019-08-30 MED ORDER — PROPOFOL 10 MG/ML IV BOLUS
INTRAVENOUS | Status: DC | PRN
  Administered 2019-08-30 (×3): 20 mg via INTRAVENOUS

## 2019-08-30 MED ORDER — KETAMINE HCL 50 MG/5ML IJ SOSY
PREFILLED_SYRINGE | INTRAMUSCULAR | Status: AC
  Filled 2019-08-30: qty 5

## 2019-08-30 NOTE — Progress Notes (Signed)
Report given to [facility]. Pt transferred to 326 via bed. Pts daughter [name] called to make aware.

## 2019-08-30 NOTE — Progress Notes (Signed)
  Subjective:  Patient has no complaints today.  She did pass bright red blood per rectum during the night.  She has not had any bowel movement since this morning.  Patient states that she was brought in a breakfast tray but she knew not to eat.  She denies abdominal pain.  Objective: Blood pressure (!) 149/81, pulse 86, temperature 97.8 F (36.6 C), temperature source Oral, resp. rate (!) 23, height 5' (1.524 m), weight 49.8 kg, SpO2 100 %. Patient is alert and in no acute distress. She appears pale. Cardiac exam with regular rhythm normal S1 and S2.  No murmur gallop noted. Auscultation lungs reveal vesicular breath sounds bilaterally. Abdomen is symmetrical soft and nontender with organomegaly or masses.  Labs/studies Results:  CBC Latest Ref Rng & Units 08/30/2019 08/29/2019 08/28/2019  WBC 4.0 - 10.5 K/uL 7.8 11.5(H) 10.8(H)  Hemoglobin 12.0 - 15.0 g/dL 7.6(L) 8.9(L) 8.1(L)  Hematocrit 36.0 - 46.0 % 24.8(L) 28.8(L) 25.3(L)  Platelets 150 - 400 K/uL 157 173 145(L)    CMP Latest Ref Rng & Units 08/30/2019 08/29/2019 08/28/2019  Glucose 70 - 99 mg/dL 121(H) 110(H) 101(H)  BUN 8 - 23 mg/dL _0 Creatinine 0.44 - 1.00 mg/dL 0.73 0.68 0.61  Sodium 135 - 145 mmol/L 135 131(L) 137  Potassium 3.5 - 5.1 mmol/L 4.5 3.3(L) 3.0(L)  Chloride 98 - 111 mmol/L 106 105 103  CO2 22 - 32 mmol/L 23 19(L) 22  Calcium 8.9 - 10.3 mg/dL 8.1(L) 7.8(L) 8.1(L)  Total Protein 6.5 - 8.1 g/dL 4.7(L) - -  Total Bilirubin 0.3 - 1.2 mg/dL 0.7 - -  Alkaline Phos 38 - 126 U/L 50 - -  AST 15 - 41 U/L 32 - -  ALT 0 - 44 U/L 18 - -    Hepatic Function Latest Ref Rng & Units 08/30/2019 08/27/2019  Total Protein 6.5 - 8.1 g/dL 4.7(L) 4.8(L)  Albumin 3.5 - 5.0 g/dL 2.3(L) 2.5(L)  AST 15 - 41 U/L 32 31  ALT 0 - 44 U/L 18 16  Alk Phosphatase 38 - 126 U/L 50 50  Total Bilirubin 0.3 - 1.2 mg/dL 0.7 0.8     Assessment:  #1.  Rectal bleeding.  Patient experienced rectal bleeding during the night.  Her  hemoglobin has dropped and she is receiving unit of PRBCs.  He is to undergo diagnostic colonoscopy today.  #2.  Iron deficiency anemia.  She has been passing blood per rectum as Wednesday last week.  She also has been taking Stanback for headache.  She therefore could have peptic ulcer disease and will also undergo esophagogastroduodenoscopy to look for peptic ulcer disease as well as varices.  #3.  New diagnosis of cirrhosis.  Work-up underway.  He has mild ascites.  #4.  Sjogren's syndrome.  Patient complains of intermittent solid food dysphagia.  If she has esophageal stricture and no varices her esophagus may be dilated.   Plan:  Esophagogastroduodenoscopy with esophageal variceal banding if varices are noted to be large. Diagnostic colonoscopy.

## 2019-08-30 NOTE — Transfer of Care (Signed)
Immediate Anesthesia Transfer of Care Note  Patient: ADDI PAK  Procedure(s) Performed: ESOPHAGOGASTRODUODENOSCOPY (EGD) WITH PROPOFOL (N/A ) COLONOSCOPY WITH PROPOFOL (N/A )  Patient Location: PACU  Anesthesia Type:General  Level of Consciousness: awake  Airway & Oxygen Therapy: Patient Spontanous Breathing  Post-op Assessment: Report given to RN  Post vital signs: Reviewed and stable  Last Vitals:  Vitals Value Taken Time  BP 114/56 08/30/19 1537  Temp 36.5 C 08/30/19 1536  Pulse 80 08/30/19 1542  Resp 19 08/30/19 1543  SpO2 96 % 08/30/19 1542  Vitals shown include unvalidated device data.  Last Pain:  Vitals:   08/30/19 1536  TempSrc:   PainSc: Asleep      Patients Stated Pain Goal: 5 (30/86/57 8469)  Complications: No apparent anesthesia complications

## 2019-08-30 NOTE — TOC Progression Note (Signed)
Transition of Care [facility]) - Progression Note    Patient Details  Name: [name] MRN: [id number] Date of Birth: 1932-12-09  Transition of Care [facility]) CM/SW Contact  Boneta Lucks, RN Phone Number: 08/30/2019, 1:34 PM  Clinical Narrative:    Patient admitted for symptomatic anemia. Patient lives at home alone. PT is recommending SNF.  CM spoke with daughter [name] and [name].  They both have agreed to a family plan to take patient home.  CM offered Home Health, at this time they are refusing. They plan to have 24/7 care and feel it is not needed.  Advise family if they change their minds and see a need to let TOC know, we will be glad to set up [place] [facility] as needed before discharge. TOC to follow.     Expected Discharge Plan: Home/Self Care Barriers to Discharge: Continued Medical Work up  Expected Discharge Plan and Services Expected Discharge Plan: Home/Self Care     Readmission Risk Interventions No flowsheet data found.

## 2019-08-30 NOTE — Anesthesia Postprocedure Evaluation (Signed)
Anesthesia Post Note  Patient: [name]  Procedure(s) Performed: ESOPHAGOGASTRODUODENOSCOPY (EGD) WITH PROPOFOL (N/A ) COLONOSCOPY WITH PROPOFOL (N/A )  Patient location during evaluation: PACU Anesthesia Type: General Level of consciousness: awake and alert and oriented Pain management: pain level controlled Vital Signs Assessment: post-procedure vital signs reviewed and stable Respiratory status: spontaneous breathing Cardiovascular status: blood pressure returned to baseline and stable Postop Assessment: no apparent nausea or vomiting Anesthetic complications: no     Last Vitals:  Vitals:   08/30/19 1410 08/30/19 1536  BP: (!) 160/75 (!) 114/56  Pulse: 84 80  Resp: 16 (!) 22  Temp: 36.8 C 36.5 C  SpO2: 100% 100%    Last Pain:  Vitals:   08/30/19 1536  TempSrc:   PainSc: Asleep                 Evarose Altland

## 2019-08-30 NOTE — Anesthesia Preprocedure Evaluation (Signed)
Anesthesia Evaluation  General Assessment Comment:Pt very weak -poor historian -pleasant  Reports she and her daughter have issues with demerol  Airway Mallampati: II  TM Distance: >3 FB Neck ROM: Full    Dental no notable dental hx. (+) Teeth Intact   Pulmonary    Pulmonary exam normal breath sounds clear to auscultation       Cardiovascular Exercise Tolerance: Poor Normal cardiovascular examIII Rhythm:Regular Rate:Normal  See recent Echo  Increased LAE/LAP, AR mild to moderate, PASP ~53   Neuro/Psych    GI/Hepatic (+) Cirrhosis       , Here for EGD/colon for anemia  Last Hgb~7.6 and received one unit after this   Endo/Other    Renal/GU      Musculoskeletal   Abdominal   Peds  Hematology  (+) anemia ,   Anesthesia Other Findings   Reproductive/Obstetrics                             Anesthesia Physical Anesthesia Plan  ASA: IV  Anesthesia Plan: General   Post-op Pain Management:    Induction: Intravenous  PONV Risk Score and Plan: 3 and TIVA, Propofol infusion, Treatment may vary due to age or medical condition and Ondansetron  Airway Management Planned: Simple Face Mask and Nasal Cannula  Additional Equipment:   Intra-op Plan:   Post-operative Plan:   Informed Consent: I have reviewed the patients History and Physical, chart, labs and discussed the procedure including the risks, benefits and alternatives for the proposed anesthesia with the patient or authorized representative who has indicated his/her understanding and acceptance.     Dental advisory given  Plan Discussed with: CRNA  Anesthesia Plan Comments: (Plan Full PPE use  Plan GA with GETA as needed d/w pt -WTP with same after Q&A)        Anesthesia Quick Evaluation

## 2019-08-30 NOTE — Op Note (Signed)
[facility] Patient Name: [name] Procedure Date: 08/30/2019 3:05 PM MRN: [id number] Date of Birth: 12/09/1932 Attending MD: Lionel December , MD CSN: [id number] Age: 85 Admit Type: Outpatient Procedure:                Colonoscopy Indications:              Rectal bleeding Providers:                Lionel December, MD, Jannett Celestine, RN, Pandora Leiter,                            Technician Referring MD:             Coralie Keens, MD Medicines:                Propofol per Anesthesia Complications:            No immediate complications. Estimated Blood Loss:     Estimated blood loss: none. Procedure:                Pre-Anesthesia Assessment:                           - Prior to the procedure, a History and Physical                            was performed, and patient medications and                            allergies were reviewed. The patient's tolerance of                            previous anesthesia was also reviewed. The risks                            and benefits of the procedure and the sedation                            options and risks were discussed with the patient.                            All questions were answered, and informed consent                            was obtained. Prior Anticoagulants: The patient has                            taken no previous anticoagulant or antiplatelet                            agents. ASA Grade Assessment: IV - A patient with                            severe systemic disease that is a constant threat  to life. After reviewing the risks and benefits,                            the patient was deemed in satisfactory condition to                            undergo the procedure.                           - Prior to the procedure, a History and Physical                            was performed, and patient medications and                            allergies were reviewed. The patient's  tolerance of                            previous anesthesia was also reviewed. The risks                            and benefits of the procedure and the sedation                            options and risks were discussed with the patient.                            All questions were answered, and informed consent                            was obtained. ASA Grade Assessment: IV - A patient                            with severe systemic disease that is a constant                            threat to life. After reviewing the risks and                            benefits, the patient was deemed in satisfactory                            condition to undergo the procedure.                           After obtaining informed consent, the colonoscope                            was passed under direct vision. Throughout the                            procedure, the patient's blood pressure, pulse, and  oxygen saturations were monitored continuously. The                            [id number]-[id number] ([id number]) scope was introduced through                            the anus and advanced to the the cecum, identified                            by appendiceal orifice and ileocecal valve. The                            colonoscopy was somewhat difficult due to                            restricted mobility of the colon. The patient                            tolerated the procedure well. The quality of the                            bowel preparation was adequate. The ileocecal                            valve, appendiceal orifice, and rectum were                            photographed. Scope In: 3:08:43 PM Scope Out: 3:31:00 PM Scope Withdrawal Time: 0 hours 10 minutes 4 seconds  Total Procedure Duration: 0 hours 22 minutes 17 seconds  Findings:      Skin tags were found on perianal exam.      The digital rectal exam findings include decreased sphincter tone.      Two small  angiodysplastic lesions without bleeding were found in the       ascending colon.      Multiple small and large-mouthed diverticula were found in the sigmoid       colon.      An area of mildly congested mucosa was found in the rectum. Impression:               - Perianal skin tags found on perianal exam.                           - Decreased sphincter tone found on digital rectal                            exam.                           - Two non-bleeding colonic angiodysplastic lesions.                           - Diverticulosis in the sigmoid colon.                           - Congested mucosa  in the rectum.                           - No specimens collected. Moderate Sedation:      Per Anesthesia Care Recommendation:           - Return patient to ICU for ongoing care.                           - Mechanical soft diet and low sodium diet today.                           - Continue present medications.                           - No recommendation at this time regarding repeat                            colonoscopy. Procedure Code(s):        --- Professional ---                           [phone], Colonoscopy, flexible; diagnostic, including                            collection of specimen(s) by brushing or washing,                            when performed (separate procedure) Diagnosis Code(s):        --- Professional ---                           K62.89, Other specified diseases of anus and rectum                           K55.20, Angiodysplasia of colon without hemorrhage                           K64.4, Residual hemorrhoidal skin tags                           K62.5, Hemorrhage of anus and rectum                           K57.30, Diverticulosis of large intestine without                            perforation or abscess without bleeding CPT copyright 2019 American Medical Association. All rights reserved. The codes documented in this report are preliminary and upon coder review  may  be revised to meet current compliance requirements. Lionel DecemberNajeeb Tyeler Goedken, MD Lionel DecemberNajeeb Cerenity Goshorn, MD 08/30/2019 3:51:14 PM This report has been signed electronically. Number of Addenda: 0

## 2019-08-30 NOTE — Op Note (Signed)
[facility] Patient Name: [name] Procedure Date: 08/30/2019 2:37 PM MRN: [id number] Date of Birth: 01-08-1933 Attending MD: Hildred Laser , MD CSN: [id number] Age: 85 Admit Type: Inpatient Procedure:                Upper GI endoscopy Indications:              Iron deficiency anemia Providers:                Hildred Laser, MD, Otis Peak B. Sharon Seller, RN, Raphael Gibney, Technician Referring MD:             Tawni Millers, MD Medicines:                Propofol per Anesthesia Complications:            No immediate complications. Estimated Blood Loss:     Estimated blood loss: none. Procedure:                Pre-Anesthesia Assessment:                           - Prior to the procedure, a History and Physical                            was performed, and patient medications and                            allergies were reviewed. The patient's tolerance of                            previous anesthesia was also reviewed. The risks                            and benefits of the procedure and the sedation                            options and risks were discussed with the patient.                            All questions were answered, and informed consent                            was obtained. Prior Anticoagulants: The patient has                            taken no previous anticoagulant or antiplatelet                            agents. ASA Grade Assessment: IV - A patient with                            severe systemic disease that is a constant threat  to life. After reviewing the risks and benefits,                            the patient was deemed in satisfactory condition to                            undergo the procedure.                           After obtaining informed consent, the endoscope was                            passed under direct vision. Throughout the                            procedure, the  patient's blood pressure, pulse, and                            oxygen saturations were monitored continuously. The                            GIF-H190 ([id number]) scope was introduced through the                            mouth, and advanced to the second part of duodenum.                            The upper GI endoscopy was accomplished without                            difficulty. The patient tolerated the procedure                            well. Scope In: 2:57:45 PM Scope Out: 3:02:25 PM Total Procedure Duration: 0 hours 4 minutes 40 seconds  Findings:      The proximal esophagus and mid esophagus were normal.      There were esophageal mucosal changes suspicious for short-segment       Barrett's esophagus present in the distal esophagus. The maximum       longitudinal extent of these mucosal changes was 1 cm in length.      The Z-line was regular and was found 34 cm from the incisors.      A 4 cm hiatal hernia was present.      Moderate portal hypertensive gastropathy was found in the gastric fundus       and in the gastric body.      The exam of the stomach was otherwise normal.      Diffuse mildly congested mucosa without active bleeding and with no       stigmata of bleeding was found in the duodenal bulb and in the second       portion of the duodenum. Impression:               - Normal proximal esophagus and mid esophagus.                           -  Esophageal mucosal changes suspicious for                            short-segment Barrett's esophagus. Biopsy not                            performed.                           - Z-line regular, 34 cm from the incisors.                           - 4 cm hiatal hernia.                           - Portal hypertensive gastropathy.                           - Congested duodenal mucosa.                           - No specimens collected. Moderate Sedation:      Per Anesthesia Care Recommendation:           - Return patient to  ICU for ongoing care.                           - Continue present medications.                           - Mechanical soft diet and low sodium diet today. Procedure Code(s):        --- Professional ---                           [id number], Esophagogastroduodenoscopy, flexible,                            transoral; diagnostic, including collection of                            specimen(s) by brushing or washing, when performed                            (separate procedure) Diagnosis Code(s):        --- Professional ---                           K22.8, Other specified diseases of esophagus                           K44.9, Diaphragmatic hernia without obstruction or                            gangrene                           K76.6, Portal hypertension  K31.89, Other diseases of stomach and duodenum                           D50.9, Iron deficiency anemia, unspecified CPT copyright 2019 American Medical Association. All rights reserved. The codes documented in this report are preliminary and upon coder review may  be revised to meet current compliance requirements. Lionel DecemberNajeeb Pancho Rushing, MD Lionel DecemberNajeeb Juley Giovanetti, MD 08/30/2019 3:45:47 PM This report has been signed electronically. Number of Addenda: 0

## 2019-08-30 NOTE — Progress Notes (Signed)
PROGRESS NOTE    [name]  WUJ:[id number] DOB: [birth year] DOA: 08/27/2019 PCP: Sharilyn Sites, MD    Brief Narrative:  85 year old female who presented with rectal bleeding.  She does have the significant past medical history for chronic diarrhea, hypothyroidism and Sjogren's.  Reported intermittent bloody diarrhea for about 5 to 6 days, more intense over the last 24 hours.  On the day of admission she had a syncope episode.  On her initial physical examination blood pressure 152/75, heart rate 80, temperature 97.9, respiratory rate 16, oxygen saturation 95%.  She had a forehead abrasion without frank hematoma, her lungs were clear to auscultation bilaterally, heart S1 and S2 present and rhythmic, her abdomen had generalized discomfort, no rebound, no guarding, no lower extremity edema. White count 7.8, hemoglobin 5.3, hematocrit 17.9, platelets 166.  Patient was admitted to the hospital with a working diagnosis of acute blood loss anemia due to suspected gastrointestinal bleed/lower GI tract.   Assessment & Plan:   Principal Problem:   Symptomatic anemia Active Problems:   Diarrhea   Portal hypertension --- liver cirrhosis   Cirrhosis of liver with ascites and portal hypertension  1. Acute blood loss anemia due to gastrointestinal bleeding, suspected lower.  Initial hemoglobin of 5.3.  She was transfused 2 units PRBC with improvement of hemoglobin to 8.9.  This morning, hemoglobin noted to be down to 7.6.  She is feeling generally weak.  Will transfuse another 1 unit of PRBC.  GI following and plans on EGD/colonoscopy this a.m. Continue on Protonix.  Syncope likely due to acute anemia, echocardiogram with preserved LV systolic function 60 to 95% with no significant valvular disease.   2. Liver cirrhosis. CT with evidence of portal hypertension and cirrhosis, along with ascites.  Plan is for screening EGD tomorrow to evaluate for varices.  3. Hypothyroid. Continue with Armour  Thyroid.   4. Depression. Continue with sertraline   5. Hypokalemia.  Replace potassium, magnesium is also low and will be replaced  DVT prophylaxis: scd   Code Status:  full Family Communication: no family at the bedside  Disposition Plan/ discharge barriers:  Pending clinical improvement.  Plan to transfer to telemetry bed after procedures today if she remains hemodynamically stable.  Body mass index is 21.44 kg/m. Malnutrition Type:      Malnutrition Characteristics:      Nutrition Interventions:     RN Pressure Injury Documentation:     Consultants:   GI   Procedures:     Antimicrobials:   Ceftriaxone 11/11 >   Subjective: Patient denies any shortness of breath or chest pain.  No abdominal pain.  Objective: Vitals:   08/30/19 1300 08/30/19 1410 08/30/19 1536 08/30/19 1638  BP: (!) 149/81 (!) 160/75 (!) 114/56   Pulse: 86 84 80   Resp: (!) 23 16 (!) 22   Temp:  98.3 F (36.8 C) 97.7 F (36.5 C) 97.9 F (36.6 C)  TempSrc:  Oral  Oral  SpO2: 100% 100% 100%   Weight:      Height:        Intake/Output Summary (Last 24 hours) at 08/30/2019 2001 Last data filed at 08/30/2019 1544 Gross per 24 hour  Intake 690 ml  Output 1500 ml  Net -810 ml   Filed Weights   08/28/19 0500 08/29/19 0500 08/30/19 0500  Weight: 46.8 kg 47.5 kg 49.8 kg    Examination:   General exam: Alert, awake, oriented x 3 Respiratory system: Clear to auscultation. Respiratory effort normal.  Cardiovascular system:RRR. No murmurs, rubs, gallops. Gastrointestinal system: Abdomen is nondistended, soft and nontender. No organomegaly or masses felt. Normal bowel sounds heard. Central nervous system: Alert and oriented. No focal neurological deficits. Extremities: No C/C/E, +pedal pulses Skin: No rashes, lesions or ulcers Psychiatry: Judgement and insight appear normal. Mood & affect appropriate.    Data Reviewed: I have personally reviewed following labs and imaging  studies  CBC: Recent Labs  Lab 08/27/19 1038 08/27/19 1936 08/28/19 0436 08/29/19 0431 08/30/19 0357 08/30/19 1759  WBC 7.3 10.7* 10.8* 11.5* 7.8  --   NEUTROABS 6.0  --   --  9.5*  --   --   HGB 5.3* 8.8* 8.1* 8.9* 7.6* 10.3*  HCT 17.9* 27.4* 25.3* 28.8* 24.8* 33.1*  MCV 95.7 93.5 92.3 95.7 96.9  --   PLT 166 131* 145* 173 157  --    Basic Metabolic Panel: Recent Labs  Lab 08/27/19 1038 08/28/19 0436 08/29/19 0230 08/29/19 0431 08/30/19 0357  NA 139 137 131*  --  135  K 3.3* 3.0* 3.3*  --  4.5  CL 108 103 105  --  106  CO2 23 22 19*  --  23  GLUCOSE 118* 101* 110*  --  121*  BUN 11 10 11   --  8  CREATININE 0.55 0.61 0.68  --  0.73  CALCIUM 8.1* 8.1* 7.8*  --  8.1*  MG  --   --   --  1.6*  --    GFR: Estimated Creatinine Clearance: 37.6 mL/min (by C-G formula based on SCr of 0.73 mg/dL). Liver Function Tests: Recent Labs  Lab 08/27/19 1038 08/30/19 0357  AST 31 32  ALT 16 18  ALKPHOS 50 50  BILITOT 0.8 0.7  PROT 4.8* 4.7*  ALBUMIN 2.5* 2.3*   No results for input(s): LIPASE, AMYLASE in the last 168 hours. No results for input(s): AMMONIA in the last 168 hours. Coagulation Profile: Recent Labs  Lab 08/27/19 1038 08/28/19 0436  INR 1.4* 1.4*   Cardiac Enzymes: Recent Labs  Lab 08/27/19 1042  CKTOTAL 57   BNP (last 3 results) No results for input(s): PROBNP in the last 8760 hours. HbA1C: No results for input(s): HGBA1C in the last 72 hours. CBG: No results for input(s): GLUCAP in the last 168 hours. Lipid Profile: No results for input(s): CHOL, HDL, LDLCALC, TRIG, CHOLHDL, LDLDIRECT in the last 72 hours. Thyroid Function Tests: Recent Labs    08/28/19 0436  FREET4 0.76  T3FREE 2.3   Anemia Panel: Recent Labs    08/29/19 0431  FERRITIN 47  TIBC 400  IRON 19*      Radiology Studies: I have reviewed all of the imaging during this hospital visit personally     Scheduled Meds: . Chlorhexidine Gluconate Cloth  6 each Topical  Daily  . mouth rinse  15 mL Mouth Rinse BID  . nystatin-triamcinolone   Topical BID  . pantoprazole (PROTONIX) IV  40 mg Intravenous Q12H  . sertraline  25 mg Oral Daily  . sodium chloride flush  3 mL Intravenous Q12H  . thyroid  120 mg Oral Q0600   Continuous Infusions: . sodium chloride    . cefTRIAXone (ROCEPHIN)  IV 1 g (08/30/19 1838)  . dextrose 5 % and 0.45% NaCl 50 mL/hr at 08/30/19 1602     LOS: 3 days        09/01/19, MD

## 2019-08-30 NOTE — Progress Notes (Signed)
Brief EGD and colonoscopy notes  EGD  Normal mucosa of the esophagus without varices. Short segment Barrett's esophagus.  It was not biopsied. 4 cm sliding hiatal hernia. Moderate portal gastropathy without stigmata of bleeding. Edema to bulbar and post bulbar mucosa.  Colonoscopy  Sentinel skin tags Diminished anal sphincter tone. 2 AV malformation at ascending colon without stigmata of bleed. Multiple medium to large diverticula at sigmoid colon none with bleeding. Edema to rectal mucosa.

## 2019-08-31 LAB — CBC
HCT: 32 % — ABNORMAL LOW (ref 36.0–46.0)
Hemoglobin: 9.9 g/dL — ABNORMAL LOW (ref 12.0–15.0)
MCH: 29.2 pg (ref 26.0–34.0)
MCHC: 30.9 g/dL (ref 30.0–36.0)
MCV: 94.4 fL (ref 80.0–100.0)
Platelets: 172 10*3/uL (ref 150–400)
RBC: 3.39 MIL/uL — ABNORMAL LOW (ref 3.87–5.11)
RDW: 19.4 % — ABNORMAL HIGH (ref 11.5–15.5)
WBC: 5.9 10*3/uL (ref 4.0–10.5)
nRBC: 0 % (ref 0.0–0.2)

## 2019-08-31 LAB — MITOCHONDRIAL ANTIBODIES: Mitochondrial M2 Ab, IgG: <20 Units (ref 0.0–20.0)

## 2019-08-31 LAB — PROTIME-INR
INR: 1.1 (ref 0.8–1.2)
Prothrombin Time: 14.2 seconds (ref 11.4–15.2)

## 2019-08-31 MED ORDER — PANTOPRAZOLE SODIUM 40 MG PO TBEC
40.0000 mg | DELAYED_RELEASE_TABLET | Freq: Every day | ORAL | Status: DC
  Administered 2019-08-31: 40 mg via ORAL
  Filled 2019-08-31: qty 1

## 2019-08-31 NOTE — Progress Notes (Signed)
PROGRESS NOTE    [name]  EYC:[id number] DOB: 05/05/1933 DOA: 08/27/2019 PCP: Sharilyn Sites, MD    Brief Narrative:  85 year old female who presented with rectal bleeding.  She does have the significant past medical history for chronic diarrhea, hypothyroidism and Sjogren's.  Reported intermittent bloody diarrhea for about 5 to 6 days, more intense over the last 24 hours.  On the day of admission she had a syncope episode.  On her initial physical examination blood pressure 152/75, heart rate 80, temperature 97.9, respiratory rate 16, oxygen saturation 95%.  She had a forehead abrasion without frank hematoma, her lungs were clear to auscultation bilaterally, heart S1 and S2 present and rhythmic, her abdomen had generalized discomfort, no rebound, no guarding, no lower extremity edema. White count 7.8, hemoglobin 5.3, hematocrit 17.9, platelets 166.  Patient was admitted to the hospital with a working diagnosis of acute blood loss anemia due to suspected gastrointestinal bleed/lower GI tract.   Assessment & Plan:   Principal Problem:   Symptomatic anemia Active Problems:   Diarrhea   Portal hypertension --- liver cirrhosis   Cirrhosis of liver with ascites and portal hypertension  1. Acute blood loss anemia due to diverticular gastrointestinal bleeding.  Initial hemoglobin of 5.3.  She has been transfused a total of 3 units of PRBCs thus far.  She is feeling better today.  Seen by GI and underwent EGD/colonoscopy on 11/13.  No evidence of ongoing bleeding.  Will continue on PPI.  Discussed with Dr. Laural Golden and plan for discharge home tomorrow if she remains stable.  Syncope likely due to acute anemia, echocardiogram with preserved LV systolic function 60 to 14% with no significant valvular disease.   2. Liver cirrhosis. CT with evidence of portal hypertension and cirrhosis, along with ascites.  Work-up thus far by GI has been unrevealing.  Will need further follow-up as an  outpatient  3. Hypothyroid. Continue with [place] Thyroid.   4. Depression. Continue with sertraline   5. Hypokalemia.  Replace potassium, magnesium is also low and will be replaced  6.  Chronic diarrhea.  Infectious work-up earlier this year was negative.  Continue on Imodium.  DVT prophylaxis: scd   Code Status:  full Family Communication: Discussed with daughter at the bedside Disposition Plan/ discharge barriers: Discharge home in the next 24 hours if hemoglobin remains stable  Body mass index is 21.44 kg/m. Malnutrition Type:      Malnutrition Characteristics:      Nutrition Interventions:     RN Pressure Injury Documentation:     Consultants:   GI   Procedures:   Colonoscopy  EGD  Antimicrobials:   Ceftriaxone 11/11 >   Subjective: No bloody stools.  No nausea or vomiting.  Objective: Vitals:   08/30/19 2100 08/30/19 2314 08/31/19 0606 08/31/19 0841  BP: (!) 142/62 (!) 151/71 (!) 154/74   Pulse: 85 81 90 80  Resp: 19 18 16 16   Temp:   98.3 F (36.8 C)   TempSrc:   Oral   SpO2: 100% 99% 99% 100%  Weight:      Height:        Intake/Output Summary (Last 24 hours) at 08/31/2019 1754 Last data filed at 08/31/2019 1600 Gross per 24 hour  Intake 1482.99 ml  Output 200 ml  Net 1282.99 ml   Filed Weights   08/28/19 0500 08/29/19 0500 08/30/19 0500  Weight: 46.8 kg 47.5 kg 49.8 kg    Examination:   General exam: Alert, awake, oriented x  3 Respiratory system: Clear to auscultation. Respiratory effort normal. Cardiovascular system:RRR. No murmurs, rubs, gallops. Gastrointestinal system: Abdomen is nondistended, soft and nontender. No organomegaly or masses felt. Normal bowel sounds heard. Central nervous system: Alert and oriented. No focal neurological deficits. Extremities: No C/C/E, +pedal pulses Skin: No rashes, lesions or ulcers Psychiatry: Judgement and insight appear normal. Mood & affect appropriate.    Data Reviewed: I have  personally reviewed following labs and imaging studies  CBC: Recent Labs  Lab 08/27/19 1038 08/27/19 1936 08/28/19 0436 08/29/19 0431 08/30/19 0357 08/30/19 1759 08/31/19 0743  WBC 7.3 10.7* 10.8* 11.5* 7.8  --  5.9  NEUTROABS 6.0  --   --  9.5*  --   --   --   HGB 5.3* 8.8* 8.1* 8.9* 7.6* 10.3* 9.9*  HCT 17.9* 27.4* 25.3* 28.8* 24.8* 33.1* 32.0*  MCV 95.7 93.5 92.3 95.7 96.9  --  94.4  PLT 166 131* 145* 173 157  --  172   Basic Metabolic Panel: Recent Labs  Lab 08/27/19 1038 08/28/19 0436 08/29/19 0230 08/29/19 0431 08/30/19 0357  NA 139 137 131*  --  135  K 3.3* 3.0* 3.3*  --  4.5  CL 108 103 105  --  106  CO2 23 22 19*  --  23  GLUCOSE 118* 101* 110*  --  121*  BUN 11 10 11   --  8  CREATININE 0.55 0.61 0.68  --  0.73  CALCIUM 8.1* 8.1* 7.8*  --  8.1*  MG  --   --   --  1.6*  --    GFR: Estimated Creatinine Clearance: 37.6 mL/min (by C-G formula based on SCr of 0.73 mg/dL). Liver Function Tests: Recent Labs  Lab 08/27/19 1038 08/30/19 0357  AST 31 32  ALT 16 18  ALKPHOS 50 50  BILITOT 0.8 0.7  PROT 4.8* 4.7*  ALBUMIN 2.5* 2.3*   No results for input(s): LIPASE, AMYLASE in the last 168 hours. No results for input(s): AMMONIA in the last 168 hours. Coagulation Profile: Recent Labs  Lab 08/27/19 1038 08/28/19 0436 08/31/19 0743  INR 1.4* 1.4* 1.1   Cardiac Enzymes: Recent Labs  Lab 08/27/19 1042  CKTOTAL 57   BNP (last 3 results) No results for input(s): PROBNP in the last 8760 hours. HbA1C: No results for input(s): HGBA1C in the last 72 hours. CBG: No results for input(s): GLUCAP in the last 168 hours. Lipid Profile: No results for input(s): CHOL, HDL, LDLCALC, TRIG, CHOLHDL, LDLDIRECT in the last 72 hours. Thyroid Function Tests: No results for input(s): TSH, T4TOTAL, FREET4, T3FREE, THYROIDAB in the last 72 hours. Anemia Panel: Recent Labs    08/29/19 0431  FERRITIN 47  TIBC 400  IRON 19*      Radiology Studies: I have  reviewed all of the imaging during this hospital visit personally     Scheduled Meds: . Chlorhexidine Gluconate Cloth  6 each Topical Daily  . mouth rinse  15 mL Mouth Rinse BID  . nystatin-triamcinolone   Topical BID  . pantoprazole  40 mg Oral q1800  . sertraline  25 mg Oral Daily  . sodium chloride flush  3 mL Intravenous Q12H  . thyroid  120 mg Oral Q0600   Continuous Infusions: . sodium chloride    . cefTRIAXone (ROCEPHIN)  IV Stopped (08/30/19 1908)     LOS: 4 days        09/01/19, MD

## 2019-08-31 NOTE — Progress Notes (Signed)
Subjective:  Patient has no complaints.  She says she is at 3 bowel movements.  1 stool was greenish and one was formed.  No melena or rectal bleeding reported.  Objective: Blood pressure (!) 154/74, pulse 80, temperature 98.3 F (36.8 C), temperature source Oral, resp. rate 16, height 5' (1.524 m), weight 49.8 kg, SpO2 100 %. Patient is alert and in no acute distress. She does not have asterixis. Abdomen is soft and nontender with organomegaly or masses.  Labs/studies Results:  CBC Latest Ref Rng & Units 08/31/2019 08/30/2019 08/30/2019  WBC 4.0 - 10.5 K/uL 5.9 - 7.8  Hemoglobin 12.0 - 15.0 g/dL 9.9(L) 10.3(L) 7.6(L)  Hematocrit 36.0 - 46.0 % 32.0(L) 33.1(L) 24.8(L)  Platelets 150 - 400 K/uL 172 - 157    CMP Latest Ref Rng & Units 08/30/2019 08/29/2019 08/28/2019  Glucose 70 - 99 mg/dL 121(H) 110(H) 101(H)  BUN 8 - 23 mg/dL _0 Creatinine 0.44 - 1.00 mg/dL 0.73 0.68 0.61  Sodium 135 - 145 mmol/L 135 131(L) 137  Potassium 3.5 - 5.1 mmol/L 4.5 3.3(L) 3.0(L)  Chloride 98 - 111 mmol/L 106 105 103  CO2 22 - 32 mmol/L 23 19(L) 22  Calcium 8.9 - 10.3 mg/dL 8.1(L) 7.8(L) 8.1(L)  Total Protein 6.5 - 8.1 g/dL 4.7(L) - -  Total Bilirubin 0.3 - 1.2 mg/dL 0.7 - -  Alkaline Phos 38 - 126 U/L 50 - -  AST 15 - 41 U/L 32 - -  ALT 0 - 44 U/L 18 - -    Hepatic Function Latest Ref Rng & Units 08/30/2019 08/27/2019  Total Protein 6.5 - 8.1 g/dL 4.7(L) 4.8(L)  Albumin 3.5 - 5.0 g/dL 2.3(L) 2.5(L)  AST 15 - 41 U/L 32 31  ALT 0 - 44 U/L 18 16  Alk Phosphatase 38 - 126 U/L 50 50  Total Bilirubin 0.3 - 1.2 mg/dL 0.7 0.8    INR is 1.1  Serum IgG 738, IgM 108 and IgA 314. Alpha-1 antitrypsin 191. Alpha-fetoprotein 1.7 Hep a antibody total is reactive Hepatitis B surface antigen is negative. Hepatitis B's surface antibody is positive. Hep B core antibody total is nonreactive. HCV antibody is negative. ANA is pending Antimitochondrial antibody pending.   Assessment:  #1.  GI bleed  secondary to colonic diverticulosis.  Patient underwent colonoscopy yesterday revealing multiple diverticula at sigmoid colon without stigmata of bleed.  Patient has received 3 units of PRBCs since admission.  #2.  Anemia secondary to colonic diverticular bleed.  Hemoglobin has remained stable over the last 24 hours.  #3.  Cirrhosis.  Work-up thus far negative.  She is immune to hepatitis a and B.  INR is back to normal which is reassuring.  She has mild ascites and does not need any diuretic at the present time.  EGD yesterday revealed no evidence of esophageal or gastric varices but she had portal hypertensive gastropathy.  She needs to be on a low salt diet.  #4.  Chronic diarrhea.  GI pathogen panel earlier this year was negative.  I elected not to take any biopsies from her colon.  It appears loperamide is working very well and she should use it on regular basis.  #5.  GERD complicated by short segment Barrett's esophagus noted on EGD yesterday.  #6.  Chronic headache.  Patient would benefit from reevaluation at headache clinic since her headache appears to be significant.  No prior visits have not been helpful but she will have more options now.  Recommendations  Patient can resume Nexium at the time of discharge.  She takes 20 mg every morning with a second dose on as-needed basis. Patient should take loperamide OTC 2 mg every morning and second dose on as-needed basis. Fiber supplement 3 to 4 g daily.  She has taken Benefiber in the past.  She can go back on it or try other preparations. Resume ferrous sulfate 650 mg p.o. daily. Will  plan to see patient in the office in 1 month.  She will have blood work prior to that visit. Regarding patient's headache she can take acetaminophen on daily basis at lowest possible dose but should never exceed more than 2 g/day.

## 2019-09-01 DIAGNOSIS — E039 Hypothyroidism, unspecified: Secondary | ICD-10-CM

## 2019-09-01 DIAGNOSIS — K922 Gastrointestinal hemorrhage, unspecified: Secondary | ICD-10-CM | POA: Diagnosis present

## 2019-09-01 DIAGNOSIS — K7460 Unspecified cirrhosis of liver: Secondary | ICD-10-CM

## 2019-09-01 DIAGNOSIS — R55 Syncope and collapse: Secondary | ICD-10-CM | POA: Diagnosis present

## 2019-09-01 DIAGNOSIS — D62 Acute posthemorrhagic anemia: Secondary | ICD-10-CM | POA: Diagnosis present

## 2019-09-01 DIAGNOSIS — E876 Hypokalemia: Secondary | ICD-10-CM

## 2019-09-01 DIAGNOSIS — K529 Noninfective gastroenteritis and colitis, unspecified: Secondary | ICD-10-CM

## 2019-09-01 DIAGNOSIS — R188 Other ascites: Secondary | ICD-10-CM

## 2019-09-01 LAB — BPAM RBC
Blood Product Expiration Date: 202012052359
Blood Product Expiration Date: 202012052359
Blood Product Expiration Date: 202012052359
ISSUE DATE / TIME: 202011101210
ISSUE DATE / TIME: 202011101453
ISSUE DATE / TIME: 202011131120
Unit Type and Rh: 6200
Unit Type and Rh: 6200
Unit Type and Rh: 6200

## 2019-09-01 LAB — TYPE AND SCREEN
ABO/RH(D): A POS
Antibody Screen: NEGATIVE
Unit division: 0
Unit division: 0
Unit division: 0

## 2019-09-01 LAB — CBC
HCT: 30.5 % — ABNORMAL LOW (ref 36.0–46.0)
Hemoglobin: 9.5 g/dL — ABNORMAL LOW (ref 12.0–15.0)
MCH: 29.2 pg (ref 26.0–34.0)
MCHC: 31.1 g/dL (ref 30.0–36.0)
MCV: 93.8 fL (ref 80.0–100.0)
Platelets: 153 10*3/uL (ref 150–400)
RBC: 3.25 MIL/uL — ABNORMAL LOW (ref 3.87–5.11)
RDW: 18.6 % — ABNORMAL HIGH (ref 11.5–15.5)
WBC: 4.7 10*3/uL (ref 4.0–10.5)
nRBC: 0 % (ref 0.0–0.2)

## 2019-09-01 MED ORDER — LOPERAMIDE HCL 2 MG PO CAPS
2.0000 mg | ORAL_CAPSULE | Freq: Once | ORAL | Status: AC
  Administered 2019-09-01: 2 mg via ORAL
  Filled 2019-09-01: qty 1

## 2019-09-01 MED ORDER — IRON 325 (65 FE) MG PO TABS: 1.0000 | ORAL_TABLET | Freq: Every day | ORAL | 0 refills | Status: DC

## 2019-09-01 MED ORDER — ESOMEPRAZOLE MAGNESIUM 20 MG PO CPDR: 20.0000 mg | DELAYED_RELEASE_CAPSULE | Freq: Every day | ORAL | Status: AC

## 2019-09-01 NOTE — Discharge Summary (Signed)
Physician Discharge Summary  Julie MarketCarolyn H Peterson WUJ:[id number] DOB: 02-23-1933 DOA: 08/27/2019  PCP: Assunta FoundGolding, John, MD  Admit date: 08/27/2019 Discharge date: 09/01/2019  Admitted From: Home Disposition: Home  Recommendations for Outpatient Follow-up:  1. Follow up with PCP in 1-2 weeks 2. Please obtain BMP/CBC in one week 3. Follow-up with gastroenterology as an outpatient   Discharge Condition: Stable CODE STATUS: Full code Diet recommendation: Heart healthy  Brief/Interim Summary: 85 year old female who presented with rectal bleeding.  She does have the significant past medical history for chronic diarrhea, hypothyroidism and Sjogren's.  Reported intermittent bloody diarrhea for about 5 to 6 days, more intense over the last 24 hours.  On the day of admission she had a syncope episode.  On her initial physical examination blood pressure 152/75, heart rate 80, temperature 97.9, respiratory rate 16, oxygen saturation 95%.  She had a forehead abrasion without frank hematoma, her lungs were clear to auscultation bilaterally, heart S1 and S2 present and rhythmic, her abdomen had generalized discomfort, no rebound, no guarding, no lower extremity edema. White count 7.8, hemoglobin 5.3, hematocrit 17.9, platelets 166.  Patient was admitted to the hospital with a working diagnosis of acute blood loss anemia due to suspected gastrointestinal bleed/lower GI tract.  Discharge Diagnoses:  Principal Problem:   Symptomatic anemia Active Problems:   Diarrhea   Portal hypertension --- liver cirrhosis   Cirrhosis of liver with ascites and portal hypertension   Acute blood loss anemia   Lower GI bleed   Syncope   Hypothyroidism   Hypokalemia   Chronic diarrhea  1. Acute blood loss anemia due to diverticular gastrointestinal bleeding.  Initial hemoglobin of 5.3.  She has been transfused a total of 3 units of PRBCs during her hospital course.  Seen by GI and underwent EGD/colonoscopy on 11/13.   No evidence of active bleeding during evaluation.    She was treated with PPI.  She has been advised not to take any NSAIDs. She will follow up with GI as an outpatient.  Syncope likely due to acute anemia, echocardiogram with preserved LV systolic function 60 to 65% with no significant valvular disease.   2. Liver cirrhosis. CT with evidence of portal hypertension and cirrhosis, along with ascites.  Work-up thus far by GI has been unrevealing.  Will need further follow-up as an outpatient. No esophageal varices on EGD  3. Hypothyroid. Continue with Armour Thyroid.   4. Depression. Continue with sertraline   5. Hypokalemia.  Replace potassium, magnesium is also low and will be replaced  6.  Chronic diarrhea.  Infectious work-up earlier this year was negative.  Continue on Imodium.  Discharge Instructions  Discharge Instructions    Diet - low sodium heart healthy   Complete by: As directed    Increase activity slowly   Complete by: As directed      Allergies as of 09/01/2019      Reactions   Ciprofloxacin Other (See Comments)   NO REACTION LISTED   Ciprocin-fluocin-procin [fluocinolone Acetonide]    Codeine    Demerol    Penicillins    Did it involve swelling of the face/tongue/throat, SOB, or low BP? Unknown Did it involve sudden or severe rash/hives, skin peeling, or any reaction on the inside of your mouth or nose? Unknown Did you need to seek medical attention at a hospital or doctor's office? Yes When did it last happen?Uknown If all above answers are "NO", may proceed with cephalosporin use. No issue with ceftriaxone   Sulfa  Antibiotics       Medication List    STOP taking these medications   STANBACK HEADACHE POWDER PO     TAKE these medications   CALCIUM MAGNESIUM 750 PO Take 1 tablet by mouth daily.   Calcium-Magnesium-Zinc 333-133-5 MG Tabs Take by mouth.   co-enzyme Q-10 30 MG capsule Take 30 mg by mouth daily.   esomeprazole 20 MG  capsule Commonly known as: NEXIUM Take 1 capsule (20 mg total) by mouth at bedtime. What changed: when to take this   FISH OIL PO Take 1 capsule by mouth daily.   Iron 325 (65 Fe) MG Tabs Take 1 tablet (325 mg total) by mouth daily at 2 PM. What changed: when to take this   loperamide 2 MG capsule Commonly known as: IMODIUM Take 2 mg by mouth as needed for diarrhea or loose stools.   sertraline 25 MG tablet Commonly known as: ZOLOFT Take 25 mg by mouth daily.   thyroid 120 MG tablet Commonly known as: ARMOUR Take 120 mg by mouth daily before breakfast. 120 one day and  the next day.   Turmeric 400 MG Caps Take 400 mg by mouth daily.   QC TUMERIC COMPLEX PO Take 1 capsule by mouth daily.   Vitamin D3 10 MCG (400 UNIT) Caps Take 1,000 mg by mouth daily.       Allergies  Allergen Reactions  . Ciprofloxacin Other (See Comments)    NO REACTION LISTED  . Ciprocin-Fluocin-Procin [Fluocinolone Acetonide]   . Codeine   . Demerol   . Penicillins     Did it involve swelling of the face/tongue/throat, SOB, or low BP? Unknown Did it involve sudden or severe rash/hives, skin peeling, or any reaction on the inside of your mouth or nose? Unknown Did you need to seek medical attention at a hospital or doctor's office? Yes When did it last happen?Uknown If all above answers are "NO", may proceed with cephalosporin use. No issue with ceftriaxone  . Sulfa Antibiotics     Consultations:  Gastroenterology   Procedures/Studies: Cxr  Result Date: 08/27/2019 CLINICAL DATA:  Rectal blood EXAM: CHEST - 2 VIEW COMPARISON:  November 04, 2014 FINDINGS: There is mild cardiomegaly. Aortic knob calcifications. No large airspace consolidation or pleural effusion. There is streaky atelectasis or scarring seen at the left lung base. No acute osseous abnormality. IMPRESSION: Mild cardiomegaly. Streaky atelectasis or scarring at the left lung base. Electronically Signed   By: Jonna Clark M.D.   On: 08/27/2019 21:38   Ct Head Wo Contrast  Result Date: 08/27/2019 CLINICAL DATA:  cHead trauma, minor, GCS>=13, high clinical risk, initial exam, syncope. C-spine trauma, ligamentous injury suspected. EXAM: CT HEAD WITHOUT CONTRAST CT CERVICAL SPINE WITHOUT CONTRAST TECHNIQUE: Multidetector CT imaging of the head and cervical spine was performed following the standard protocol without intravenous contrast. Multiplanar CT image reconstructions of the cervical spine were also generated. COMPARISON:  Head CT 06/13/2008 FINDINGS: CT HEAD FINDINGS Brain: No evidence of acute intracranial hemorrhage. No demarcated cortical infarction. No evidence of intracranial mass. No midline shift or extra-axial fluid collection. Minimal ill-defined hypoattenuation within the cerebral white matter is nonspecific, but consistent with chronic small vessel ischemic disease. Mild generalized parenchymal atrophy. Vascular: No hyperdense vessel. Skull: Normal. Negative for fracture or focal lesion. Sinuses/Orbits: Visualized orbits demonstrate no acute abnormality. No significant paranasal sinus disease or mastoid effusion. CT CERVICAL SPINE FINDINGS Mildly motion degraded examination. Alignment: No significant spondylolisthesis Skull base and vertebrae: The basion-dental and  atlanto-dental intervals are maintained.No evidence of acute fracture to the cervical spine. Soft tissues and spinal canal: No prevertebral fluid or swelling. No visible canal hematoma. Disc levels: Cervical spondylosis with multilevel posterior disc osteophytes, uncovertebral and facet hypertrophy. Upper chest: Emphysema within the imaged lung apices. No visible pneumothorax. Other: Carotid artery atherosclerotic calcification. IMPRESSION: CT head: 1. No evidence of acute intracranial abnormality. 2. Mild generalized parenchymal atrophy and chronic small vessel ischemic disease CT cervical spine: 1. No evidence of acute fracture to the cervical  spine. 2. Cervical spondylosis as described. Electronically Signed   By: Kellie Simmering DO   On: 08/27/2019 14:03   Ct Cervical Spine Wo Contrast  Result Date: 08/27/2019 CLINICAL DATA:  Cervical spine trauma secondary to a fall. The patient was found down in the bathroom. EXAM: CT CERVICAL SPINE WITHOUT CONTRAST TECHNIQUE: Multidetector CT imaging of the cervical spine was performed without intravenous contrast. Multiplanar CT image reconstructions were also generated. COMPARISON:  None. FINDINGS: Alignment: Normal. Skull base and vertebrae: No acute fracture. No primary bone lesion or focal pathologic process. Soft tissues and spinal canal: The prevertebral soft tissues are normal. There is a 15 mm rim calcified nodule in the right lobe of the thyroid gland. No visible canal hematoma. Disc levels: C2-3: Tiny central disc bulge with no neural impingement. Moderate right facet arthritis. No foraminal stenosis. C3-4: Marked disc space narrowing. Small broad-based disc osteophyte complex most prominent centrally. No foraminal stenosis. C4-5: Chronic disc space narrowing. Small disc bulge with accompanying osteophytes to the left of midline. No foraminal stenosis. C5-6: Chronic disc space narrowing. Slight narrowing of the right neural foramen due to uncinate spurs. No disc bulging or protrusion. C6-7: Disc space narrowing. Minimal endplate osteophytes to the left of midline without neural impingement. Widely patent neural foramina. C7-T1: Normal disc. Slight left facet arthritis. No foraminal stenosis. Upper chest: Negative. Other: None IMPRESSION: 1. No acute abnormality of the cervical spine. Multilevel degenerative disc and joint disease. 2. 15 mm rim calcified nodule in the right lobe of the thyroid gland. Electronically Signed   By: Lorriane Shire M.D.   On: 08/27/2019 13:57   US Abdomen Complete  Result Date: 08/28/2019 CLINICAL DATA:  Abdominal pain and rectal bleeding. EXAM: ABDOMEN ULTRASOUND COMPLETE  COMPARISON:  CT AP 08/27/2019 FINDINGS: Gallbladder: Gallbladder wall is thickened measuring 7 mm. Negative sonographic Murphy's sign. No stone or sludge identified. Common bile duct: Diameter: 5 mm Liver: Micro nodular contour of the liver is identified with heterogeneous echotexture. Portal vein is patent on color Doppler imaging with normal direction of blood flow towards the liver. IVC: No abnormality visualized. Pancreas: Visualized portion unremarkable. Spleen: Size and appearance within normal limits. Right Kidney: Length: 10.1 cm. Echogenicity within normal limits. No mass or hydronephrosis visualized. Left Kidney: Length: 10.8 cm. Cyst within inferior pole of the left kidney measures 5.4 x 4.3 x 4.7 cm. Echogenicity within normal limits. No mass or hydronephrosis visualized. Abdominal aorta: No aneurysm visualized.Aortic atherosclerosis. Other findings: Ascites and bilateral pleural effusions. IMPRESSION: 1. Cirrhosis of the liver. Ascites is identified compatible with portal venous hypertension. 2. Gallbladder wall thickening. In the setting of portal venous hypertension and ascites this may be a nonspecific finding. No gallstones or sonographic Murphy's sign noted. 3. Ascites, bilateral pleural effusions. 4.  Aortic Atherosclerosis (ICD10-I70.0). Electronically Signed   By: Kerby Moors M.D.   On: 08/28/2019 11:28   Ct Abdomen Pelvis W Contrast  Result Date: 08/27/2019 CLINICAL DATA:  85 year old female with abdominal  pain and rectal bleeding. EXAM: CT ABDOMEN AND PELVIS WITH CONTRAST TECHNIQUE: Multidetector CT imaging of the abdomen and pelvis was performed using the standard protocol following bolus administration of intravenous contrast. CONTRAST:  OMNIPAQUE IOHEXOL 300 MG/ML  SOLN COMPARISON:  None. FINDINGS: Lower chest: Partially visualized small bilateral pleural effusions with associated partial compressive atelectasis of the lung bases. Pneumonia is not excluded. Clinical  correlation is recommended. There is diffuse interstitial and interlobular septal thickening of the visualized lung bases most consistent with edema. There is mild cardiomegaly. A pericardial effusion is partially visualized measuring approximately 1 cm in thickness. No intra-abdominal free air. There is a moderate size ascites. Hepatobiliary: Nodular liver contour in keeping with cirrhosis. No intrahepatic biliary ductal dilatation. The gallbladder is unremarkable. Pancreas: Unremarkable. No pancreatic ductal dilatation or surrounding inflammatory changes. Spleen: Normal in size without focal abnormality. Adrenals/Urinary Tract: The right adrenal gland is unremarkable. The left adrenal gland is not well visualized. There is no hydronephrosis on either side. There is symmetric enhancement and excretion of contrast by both kidneys. There is a 4.5 cm left renal inferior pole cyst. The urinary bladder is grossly unremarkable. Stomach/Bowel: There is sigmoid diverticulosis without active inflammatory changes. There is diffuse thickened appearance of the colon which may be related to ascites and hepatic colopathy. Colitis is not excluded. Clinical correlation is recommended. Similarly there is diffuse thickened appearance of the stomach and small bowel likely related to ascites and liver disease. There is a small hiatal hernia with evidence of gastroesophageal reflux. The appendix is not visualized with certainty. Vascular/Lymphatic: There is advanced aortoiliac atherosclerotic disease. The IVC is unremarkable. No portal venous gas. There is no adenopathy. The SMV, splenic vein, and main portal vein are patent. Reproductive: Hysterectomy. No adnexal masses. Other: Diffuse subcutaneous edema and anasarca. Musculoskeletal: Degenerative changes of the spine. No acute osseous pathology. IMPRESSION: 1. Cirrhosis with evidence of portal hypertension, moderate ascites, and anasarca. 2. Thickened appearance of the colon and  small bowel likely related to ascites and liver disease. Colitis is not excluded. Clinical correlation is recommended. 3. Sigmoid diverticulosis. 4. Cardiomegaly with findings of CHF. Partially visualized small bilateral pleural effusions with associated partial compressive atelectasis of the lung bases. Pneumonia is not excluded. Clinical correlation is recommended. Aortic Atherosclerosis (ICD10-I70.0). Electronically Signed   By: Elgie Collard M.D.   On: 08/27/2019 14:01      Subjective: No further blood in stools.  No abdominal pain.  Discharge Exam: Vitals:   08/31/19 0841 08/31/19 1943 08/31/19 2224 09/01/19 0529  BP:   (!) 159/83 (!) 144/75  Pulse: 80  (!) 103 (!) 101  Resp: 16  20 20   Temp:   98.5 F (36.9 C) 98.2 F (36.8 C)  TempSrc:   Oral Oral  SpO2: 100% 93% 94% 92%  Weight:      Height:        General: Pt is alert, awake, not in acute distress Cardiovascular: RRR, S1/S2 +, no rubs, no gallops Respiratory: CTA bilaterally, no wheezing, no rhonchi Abdominal: Soft, NT, ND, bowel sounds + Extremities: no edema, no cyanosis    The results of significant diagnostics from this hospitalization (including imaging, microbiology, ancillary and laboratory) are listed below for reference.     Microbiology: Recent Results (from the past 240 hour(s))  SARS CORONAVIRUS 2 (TAT 6-24 HRS) Nasopharyngeal Nasopharyngeal Swab     Status: None   Collection Time: 08/27/19  2:14 PM   Specimen: Nasopharyngeal Swab  Result Value Ref Range Status  SARS Coronavirus 2 NEGATIVE NEGATIVE Final    Comment: (NOTE) SARS-CoV-2 target nucleic acids are NOT DETECTED. The SARS-CoV-2 RNA is generally detectable in upper and lower respiratory specimens during the acute phase of infection. Negative results do not preclude SARS-CoV-2 infection, do not rule out co-infections with other pathogens, and should not be used as the sole basis for treatment or other patient management  decisions. Negative results must be combined with clinical observations, patient history, and epidemiological information. The expected result is Negative. Fact Sheet for Patients: HairSlick.no Fact Sheet for Healthcare Providers: quierodirigir.com This test is not yet approved or cleared by the [place] FDA and  has been authorized for detection and/or diagnosis of SARS-CoV-2 by FDA under an Emergency Use Authorization (EUA). This EUA will remain  in effect (meaning this test can be used) for the duration of the COVID-19 declaration under Section 56 4(b)(1) of the Act, 21 U.S.C. section 360bbb-3(b)(1), unless the authorization is terminated or revoked sooner. Performed at [facility], [address], [place], [place] [address]   MRSA PCR Screening     Status: None   Collection Time: 08/27/19 10:58 PM   Specimen: Nasal Mucosa; Nasopharyngeal  Result Value Ref Range Status   MRSA by PCR NEGATIVE NEGATIVE Final    Comment:        The GeneXpert MRSA Assay (FDA approved for NASAL specimens only), is one component of a comprehensive MRSA colonization surveillance program. It is not intended to diagnose MRSA infection nor to guide or monitor treatment for MRSA infections. Performed at [facility], [address] [place], [place] [address]      Labs: BNP (last 3 results) No results for input(s): BNP in the last 8760 hours. Basic Metabolic Panel: Recent Labs  Lab 08/27/19 1038 08/28/19 0436 08/29/19 0230 08/29/19 0431 08/30/19 0357  NA 139 137 131*  --  135  K 3.3* 3.0* 3.3*  --  4.5  CL 108 103 105  --  106  CO2 23 22 19*  --  23  GLUCOSE 118* 101* 110*  --  121*  BUN --  8  CREATININE 0.55 0.61 0.68  --  0.73  CALCIUM 8.1* 8.1* 7.8*  --  8.1*  MG  --   --   --  1.6*  --    Liver Function Tests: Recent Labs  Lab 08/27/19 1038 08/30/19 0357  AST 31 32  ALT 16 18  ALKPHOS 50 50   BILITOT 0.8 0.7  PROT 4.8* 4.7*  ALBUMIN 2.5* 2.3*   No results for input(s): LIPASE, AMYLASE in the last 168 hours. No results for input(s): AMMONIA in the last 168 hours. CBC: Recent Labs  Lab 08/27/19 1038  08/28/19 0436 08/29/19 0431 08/30/19 0357 08/30/19 1759 08/31/19 0743 09/01/19 0654  WBC 7.3   < > 10.8* 11.5* 7.8  --  5.9 4.7  NEUTROABS 6.0  --   --  9.5*  --   --   --   --   HGB 5.3*   < > 8.1* 8.9* 7.6* 10.3* 9.9* 9.5*  HCT 17.9*   < > 25.3* 28.8* 24.8* 33.1* 32.0* 30.5*  MCV 95.7   < > 92.3 95.7 96.9  --  94.4 93.8  PLT 166   < > 145* 173 157  --  172 153   < > = values in this interval not displayed.   Cardiac Enzymes: Recent Labs  Lab 08/27/19 1042  CKTOTAL 57  BNP: Invalid input(s): POCBNP CBG: No results for input(s): GLUCAP in the last 168 hours. D-Dimer No results for input(s): DDIMER in the last 72 hours. Hgb A1c No results for input(s): HGBA1C in the last 72 hours. Lipid Profile No results for input(s): CHOL, HDL, LDLCALC, TRIG, CHOLHDL, LDLDIRECT in the last 72 hours. Thyroid function studies No results for input(s): TSH, T4TOTAL, T3FREE, THYROIDAB in the last 72 hours.  Invalid input(s): FREET3 Anemia work up No results for input(s): VITAMINB12, FOLATE, FERRITIN, TIBC, IRON, RETICCTPCT in the last 72 hours. Urinalysis No results found for: COLORURINE, APPEARANCEUR, LABSPEC, PHURINE, GLUCOSEU, HGBUR, BILIRUBINUR, KETONESUR, PROTEINUR, UROBILINOGEN, NITRITE, LEUKOCYTESUR Sepsis Labs Invalid input(s): PROCALCITONIN,  WBC,  LACTICIDVEN Microbiology Recent Results (from the past 240 hour(s))  SARS CORONAVIRUS 2 (TAT 6-24 HRS) Nasopharyngeal Nasopharyngeal Swab     Status: None   Collection Time: 08/27/19  2:14 PM   Specimen: Nasopharyngeal Swab  Result Value Ref Range Status   SARS Coronavirus 2 NEGATIVE NEGATIVE Final    Comment: (NOTE) SARS-CoV-2 target nucleic acids are NOT DETECTED. The SARS-CoV-2 RNA is generally detectable in upper  and lower respiratory specimens during the acute phase of infection. Negative results do not preclude SARS-CoV-2 infection, do not rule out co-infections with other pathogens, and should not be used as the sole basis for treatment or other patient management decisions. Negative results must be combined with clinical observations, patient history, and epidemiological information. The expected result is Negative. Fact Sheet for Patients: HairSlick.no Fact Sheet for Healthcare Providers: quierodirigir.com This test is not yet approved or cleared by the [place] FDA and  has been authorized for detection and/or diagnosis of SARS-CoV-2 by FDA under an Emergency Use Authorization (EUA). This EUA will remain  in effect (meaning this test can be used) for the duration of the COVID-19 declaration under Section 56 4(b)(1) of the Act, 21 U.S.C. section 360bbb-3(b)(1), unless the authorization is terminated or revoked sooner. Performed at [facility], [address] [place], [place] [address]   MRSA PCR Screening     Status: None   Collection Time: 08/27/19 10:58 PM   Specimen: Nasal Mucosa; Nasopharyngeal  Result Value Ref Range Status   MRSA by PCR NEGATIVE NEGATIVE Final    Comment:        The GeneXpert MRSA Assay (FDA approved for NASAL specimens only), is one component of a comprehensive MRSA colonization surveillance program. It is not intended to diagnose MRSA infection nor to guide or monitor treatment for MRSA infections. Performed at [facility], [address], [place], [place] [address]      Time coordinating discharge:  SIGNED:   Erick Blinks, MD  [facility] 09/01/2019, 7:12 PM   If 7PM-7AM, please contact night-coverage [email or web address]

## 2019-09-01 NOTE — Progress Notes (Signed)
Subjective:  Patient continues to complain of diarrhea.  She states she has had 7-8 bowel movements in the last 24 hours.  Most of her stool volume has been small.  No melena or rectal bleeding reported.  Her appetite is fine.  She says perianal discomfort has improved since she has been using the cream. Objective: Blood pressure (!) 144/75, pulse (!) 101, temperature 98.2 F (36.8 C), temperature source Oral, resp. rate 20, height 5' (1.524 m), weight 49.8 kg, SpO2 92 %. Patient is alert and in no acute distress. Abdomen is soft and nontender with organomegaly or masses. No LE edema noted.  Labs/studies Results:  CBC Latest Ref Rng & Units 09/01/2019 08/31/2019 08/30/2019  WBC 4.0 - 10.5 K/uL 4.7 5.9 -  Hemoglobin 12.0 - 15.0 g/dL 9.5(L) 9.9(L) 10.3(L)  Hematocrit 36.0 - 46.0 % 30.5(L) 32.0(L) 33.1(L)  Platelets 150 - 400 K/uL 153 172 -    CMP Latest Ref Rng & Units 08/30/2019 08/29/2019 08/28/2019  Glucose 70 - 99 mg/dL 121(H) 110(H) 101(H)  BUN 8 - 23 mg/dL '8 11 10  ' Creatinine 0.44 - 1.00 mg/dL 0.73 0.68 0.61  Sodium 135 - 145 mmol/L 135 131(L) 137  Potassium 3.5 - 5.1 mmol/L 4.5 3.3(L) 3.0(L)  Chloride 98 - 111 mmol/L 106 105 103  CO2 22 - 32 mmol/L 23 19(L) 22  Calcium 8.9 - 10.3 mg/dL 8.1(L) 7.8(L) 8.1(L)  Total Protein 6.5 - 8.1 g/dL 4.7(L) - -  Total Bilirubin 0.3 - 1.2 mg/dL 0.7 - -  Alkaline Phos 38 - 126 U/L 50 - -  AST 15 - 41 U/L 32 - -  ALT 0 - 44 U/L 18 - -    Hepatic Function Latest Ref Rng & Units 08/30/2019 08/27/2019  Total Protein 6.5 - 8.1 g/dL 4.7(L) 4.8(L)  Albumin 3.5 - 5.0 g/dL 2.3(L) 2.5(L)  AST 15 - 41 U/L 32 31  ALT 0 - 44 U/L 18 16  Alk Phosphatase 38 - 126 U/L 50 50  Total Bilirubin 0.3 - 1.2 mg/dL 0.7 0.8    Antimitochondrial antibodies negative ANA is pending.  Assessment:  #1.  Lower GI bleed felt to be due to colonic diverticulosis.  She underwent colonoscopy 2 days ago revealing extensive diverticuli at sigmoid colon.  No bleeding in  the last 48 hours.  #2.  Anemia secondary to GI bleed.  She also has iron deficiency.  She has been chronic PPI therapy and may have impaired iron absorption.  Given history of chronic diarrhea will screen her for celiac disease as well.  #3.  Cirrhosis.  This is a new diagnosis.  She has mild ascites.  EGD 2 days ago was negative for esophageal or gastric varices but she did have portal gastropathy.  She is immune to hepatitis a and B.  ANA is pending but all other markers are negative.  #4.  Chronic diarrhea.  She has responded to loperamide which will be resumed.  Will screen for celiac disease.  Duodenal mucosa however here to be normal.  I elected not to biopsy it.  #5.  Chronic GERD.  EGD revealed 4 cm sliding hiatal hernia and short segment Barrett's esophagus.  Once again I elected not to biopsy it.   Recommendations  Loperamide 2 mg p.o. now.  At home she can resume at a dose of 2 mg p.o. twice daily. Celiac disease panel. Patient her daughter has been instructed that she should be on low-salt diet. She will continue OTC Nexium as before.  She will take ferrous sulfate 325 mg p.o. daily. She should not take any NSAIDs. She will return for office visit in 4 weeks and she will have blood work prior to her visit.

## 2019-09-01 NOTE — Progress Notes (Signed)
IV x2 removed, D/C instructions reviewed with patient, verbalized understanding. Patient to be transported to private vehicle via wheelchair.

## 2019-09-02 LAB — ANTINUCLEAR ANTIBODIES, IFA: ANA Ab, IFA: NEGATIVE

## 2019-09-02 LAB — GLIADIN ANTIBODIES, SERUM
Antigliadin Abs, IgA: 9 units (ref 0–19)
Gliadin IgG: 14 units (ref 0–19)

## 2019-09-02 LAB — TISSUE TRANSGLUTAMINASE, IGA: Tissue Transglutaminase Ab, IgA: <2 U/mL (ref 0–3)

## 2019-09-04 ENCOUNTER — Encounter (HOSPITAL_COMMUNITY): Payer: Self-pay | Admitting: Emergency Medicine

## 2019-09-04 ENCOUNTER — Telehealth (INDEPENDENT_AMBULATORY_CARE_PROVIDER_SITE_OTHER): Payer: Self-pay | Admitting: Internal Medicine

## 2019-09-04 ENCOUNTER — Inpatient Hospital Stay (HOSPITAL_COMMUNITY)
Admission: EM | Admit: 2019-09-04 | Discharge: 2019-09-06 | DRG: 378 | Disposition: A | Discharge status: Home / Self Care | Payer: Medicare Other | Attending: Internal Medicine | Admitting: Internal Medicine

## 2019-09-04 ENCOUNTER — Other Ambulatory Visit: Payer: Self-pay

## 2019-09-04 DIAGNOSIS — E611 Iron deficiency: Secondary | ICD-10-CM | POA: Diagnosis present

## 2019-09-04 DIAGNOSIS — K5731 Diverticulosis of large intestine without perforation or abscess with bleeding: Principal | ICD-10-CM | POA: Diagnosis present

## 2019-09-04 DIAGNOSIS — Z882 Allergy status to sulfonamides status: Secondary | ICD-10-CM

## 2019-09-04 DIAGNOSIS — F329 Major depressive disorder, single episode, unspecified: Secondary | ICD-10-CM | POA: Diagnosis present

## 2019-09-04 DIAGNOSIS — K766 Portal hypertension: Secondary | ICD-10-CM | POA: Diagnosis present

## 2019-09-04 DIAGNOSIS — I10 Essential (primary) hypertension: Secondary | ICD-10-CM | POA: Diagnosis present

## 2019-09-04 DIAGNOSIS — I959 Hypotension, unspecified: Secondary | ICD-10-CM | POA: Diagnosis not present

## 2019-09-04 DIAGNOSIS — K219 Gastro-esophageal reflux disease without esophagitis: Secondary | ICD-10-CM | POA: Diagnosis present

## 2019-09-04 DIAGNOSIS — K922 Gastrointestinal hemorrhage, unspecified: Secondary | ICD-10-CM

## 2019-09-04 DIAGNOSIS — Z20828 Contact with and (suspected) exposure to other viral communicable diseases: Secondary | ICD-10-CM | POA: Diagnosis present

## 2019-09-04 DIAGNOSIS — Z885 Allergy status to narcotic agent status: Secondary | ICD-10-CM

## 2019-09-04 DIAGNOSIS — E039 Hypothyroidism, unspecified: Secondary | ICD-10-CM | POA: Diagnosis present

## 2019-09-04 DIAGNOSIS — K529 Noninfective gastroenteritis and colitis, unspecified: Secondary | ICD-10-CM | POA: Diagnosis present

## 2019-09-04 DIAGNOSIS — M3500 Sicca syndrome, unspecified: Secondary | ICD-10-CM | POA: Diagnosis present

## 2019-09-04 DIAGNOSIS — R58 Hemorrhage, not elsewhere classified: Secondary | ICD-10-CM | POA: Diagnosis not present

## 2019-09-04 DIAGNOSIS — E876 Hypokalemia: Secondary | ICD-10-CM | POA: Diagnosis present

## 2019-09-04 DIAGNOSIS — Z79899 Other long term (current) drug therapy: Secondary | ICD-10-CM | POA: Diagnosis not present

## 2019-09-04 DIAGNOSIS — Z888 Allergy status to other drugs, medicaments and biological substances status: Secondary | ICD-10-CM

## 2019-09-04 DIAGNOSIS — Z880 Allergy status to penicillin: Secondary | ICD-10-CM

## 2019-09-04 DIAGNOSIS — D649 Anemia, unspecified: Secondary | ICD-10-CM

## 2019-09-04 DIAGNOSIS — D62 Acute posthemorrhagic anemia: Secondary | ICD-10-CM | POA: Diagnosis present

## 2019-09-04 DIAGNOSIS — K7460 Unspecified cirrhosis of liver: Secondary | ICD-10-CM | POA: Diagnosis present

## 2019-09-04 HISTORY — DX: Gastrointestinal hemorrhage, unspecified: K92.2

## 2019-09-04 LAB — CBC WITH DIFFERENTIAL/PLATELET
Abs Immature Granulocytes: 0.04 10*3/uL (ref 0.00–0.07)
Basophils Absolute: 0.1 10*3/uL (ref 0.0–0.1)
Basophils Relative: 0 %
Eosinophils Absolute: 0.3 10*3/uL (ref 0.0–0.5)
Eosinophils Relative: 3 %
HCT: 26 % — ABNORMAL LOW (ref 36.0–46.0)
Hemoglobin: 7.8 g/dL — ABNORMAL LOW (ref 12.0–15.0)
Immature Granulocytes: 0 %
Lymphocytes Relative: 8 %
Lymphs Abs: 0.9 10*3/uL (ref 0.7–4.0)
MCH: 28.8 pg (ref 26.0–34.0)
MCHC: 30 g/dL (ref 30.0–36.0)
MCV: 95.9 fL (ref 80.0–100.0)
Monocytes Absolute: 1.1 10*3/uL — ABNORMAL HIGH (ref 0.1–1.0)
Monocytes Relative: 10 %
Neutro Abs: 8.9 10*3/uL — ABNORMAL HIGH (ref 1.7–7.7)
Neutrophils Relative %: 79 %
Platelets: 202 10*3/uL (ref 150–400)
RBC: 2.71 MIL/uL — ABNORMAL LOW (ref 3.87–5.11)
RDW: 18.2 % — ABNORMAL HIGH (ref 11.5–15.5)
WBC: 11.3 10*3/uL — ABNORMAL HIGH (ref 4.0–10.5)
nRBC: 0 % (ref 0.0–0.2)

## 2019-09-04 LAB — BASIC METABOLIC PANEL
Anion gap: 10 (ref 5–15)
BUN: 11 mg/dL (ref 8–23)
CO2: 22 mmol/L (ref 22–32)
Calcium: 8.2 mg/dL — ABNORMAL LOW (ref 8.9–10.3)
Chloride: 106 mmol/L (ref 98–111)
Creatinine, Ser: 0.69 mg/dL (ref 0.44–1.00)
GFR calc Af Amer: >60 mL/min (ref >60)
GFR calc non Af Amer: >60 mL/min (ref >60)
Glucose, Bld: 138 mg/dL — ABNORMAL HIGH (ref 70–99)
Potassium: 3.4 mmol/L — ABNORMAL LOW (ref 3.5–5.1)
Sodium: 138 mmol/L (ref 135–145)

## 2019-09-04 LAB — TSH: TSH: 18.39 u[IU]/mL — ABNORMAL HIGH (ref 0.350–4.500)

## 2019-09-04 LAB — RETICULIN ANTIBODIES, IGA W TITER: Reticulin Ab, IgA: NEGATIVE titer (ref <2.5)

## 2019-09-04 LAB — SARS CORONAVIRUS 2 (TAT 6-24 HRS): SARS Coronavirus 2: NEGATIVE

## 2019-09-04 LAB — PREPARE RBC (CROSSMATCH)

## 2019-09-04 LAB — POC OCCULT BLOOD, ED: Fecal Occult Bld: POSITIVE — AB

## 2019-09-04 LAB — HEMOGLOBIN AND HEMATOCRIT, BLOOD
HCT: 23.9 % — ABNORMAL LOW (ref 36.0–46.0)
Hemoglobin: 6.9 g/dL — CL (ref 12.0–15.0)

## 2019-09-04 MED ORDER — TURMERIC 400 MG PO CAPS: 400.0000 mg | ORAL_CAPSULE | Freq: Every day | ORAL | Status: DC

## 2019-09-04 MED ORDER — FERROUS SULFATE 325 (65 FE) MG PO TABS
325.0000 mg | ORAL_TABLET | Freq: Every day | ORAL | Status: DC
  Administered 2019-09-05 – 2019-09-06 (×2): 325 mg via ORAL
  Filled 2019-09-04 (×5): qty 1

## 2019-09-04 MED ORDER — COENZYME Q10 30 MG PO CAPS: 30.0000 mg | ORAL_CAPSULE | Freq: Every day | ORAL | Status: DC

## 2019-09-04 MED ORDER — ONDANSETRON HCL 4 MG PO TABS: 4.0000 mg | ORAL_TABLET | Freq: Four times a day (QID) | ORAL | Status: DC | PRN

## 2019-09-04 MED ORDER — PANTOPRAZOLE SODIUM 40 MG IV SOLR
40.0000 mg | INTRAVENOUS | Status: DC
  Administered 2019-09-05: 40 mg via INTRAVENOUS
  Filled 2019-09-04 (×2): qty 40

## 2019-09-04 MED ORDER — PANTOPRAZOLE SODIUM 40 MG IV SOLR
40.0000 mg | Freq: Once | INTRAVENOUS | Status: AC
  Administered 2019-09-04: 40 mg via INTRAVENOUS

## 2019-09-04 MED ORDER — ACETAMINOPHEN 325 MG PO TABS: 650.0000 mg | ORAL_TABLET | Freq: Four times a day (QID) | ORAL | Status: DC | PRN

## 2019-09-04 MED ORDER — THYROID 30 MG PO TABS
120.0000 mg | ORAL_TABLET | Freq: Every day | ORAL | Status: DC
  Administered 2019-09-05 – 2019-09-06 (×2): 120 mg via ORAL
  Filled 2019-09-04 (×7): qty 4

## 2019-09-04 MED ORDER — SODIUM CHLORIDE 0.9 % IV BOLUS
500.0000 mL | Freq: Once | INTRAVENOUS | Status: AC
  Administered 2019-09-04: 500 mL via INTRAVENOUS

## 2019-09-04 MED ORDER — LOPERAMIDE HCL 2 MG PO CAPS
2.0000 mg | ORAL_CAPSULE | ORAL | Status: DC | PRN
  Administered 2019-09-05 (×2): 2 mg via ORAL
  Filled 2019-09-04 (×2): qty 1

## 2019-09-04 MED ORDER — LACTATED RINGERS IV SOLN: INTRAVENOUS | Status: AC

## 2019-09-04 MED ORDER — ONDANSETRON HCL 4 MG/2ML IJ SOLN: 4.0000 mg | Freq: Four times a day (QID) | INTRAMUSCULAR | Status: DC | PRN

## 2019-09-04 MED ORDER — ACETAMINOPHEN 650 MG RE SUPP: 650.0000 mg | Freq: Four times a day (QID) | RECTAL | Status: DC | PRN

## 2019-09-04 MED ORDER — SERTRALINE HCL 50 MG PO TABS
25.0000 mg | ORAL_TABLET | Freq: Every day | ORAL | Status: DC
  Administered 2019-09-05 – 2019-09-06 (×2): 25 mg via ORAL
  Filled 2019-09-04 (×2): qty 1

## 2019-09-04 MED ORDER — SODIUM CHLORIDE 0.9% IV SOLUTION: Freq: Once | INTRAVENOUS | Status: DC

## 2019-09-04 NOTE — Consult Note (Signed)
Referring Provider: Maurilio Lovely, DO Primary Care Physician:  Assunta Found, MD Primary Gastroenterologist:  Dr. Karilyn Cota  Reason for Consultation:   Rectal bleeding.  HPI:   Patient is 85 year old Caucasian female who has history of Sjogren's syndrome GERD and chronic diarrhea responsive to loperamide who was admitted to this facility on 08/27/2019 for lower GI bleed and anemia.  She received 3 units of PRBCs.  She had EGD revealing short segment Barrett's esophagus sliding hiatal hernia and portal hypertensive gastropathy.  Colonoscopy revealed multiple diverticuli at sigmoid colon but without stigmata of bleed.  She had external hemorrhoids. During her last admission she was also diagnosed with cirrhosis.  She had mild ascites.  Work-up as to the etiology of cirrhosis was negative.  She was noted to be immune to hepatitis a and B.  I felt that her cirrhosis was either cryptogenic or secondary to Sjogren's syndrome. Regarding her diarrhea celiac antibody panel was checked and was negative.  Patient was doing well when she was discharged 3 days ago.  She states she feels well.  She had a good appetite and ate well for the last 2 days.  She passed bright red blood per rectum and this morning she thought she was going to have a bowel movement and instead passed large amount of bright red blood per rectum.  She did not experience abdominal pain.  She did not have nausea vomiting or diaphoresis.  911 was called and patient was brought to emergency room by EMS. Hemoglobin on arrival was 7.8 g.  Hemoglobin prior to discharge was 9.5 g. Repeat hemoglobin is 6.9.  She has not had any bowel movement since admission. He denies chest pain shortness of breath nausea or vomiting.  She has not taken Stanback since prior to her last admission.  He had been taking it chronically for headache.   Past Medical History:  Diagnosis Date  . GI bleed   . Hypertension   . Thyroid disease    hypothyroidism   Cirrhosis either cryptogenic or secondary to Sjogren's syndrome diagnosed last week.  Past Surgical History:  Procedure Laterality Date  . COLONOSCOPY WITH ESOPHAGOGASTRODUODENOSCOPY (EGD) AND ESOPHAGEAL DILATION (ED)     years ago  . COLONOSCOPY WITH PROPOFOL N/A 08/30/2019   Procedure: COLONOSCOPY WITH PROPOFOL;  Surgeon: Malissa Hippo, MD;  Location: [facility];  Service: Endoscopy;  Laterality: N/A;  . complete hysterectomy    . ESOPHAGOGASTRODUODENOSCOPY (EGD) WITH PROPOFOL N/A 08/30/2019   Procedure: ESOPHAGOGASTRODUODENOSCOPY (EGD) WITH PROPOFOL;  Surgeon: Malissa Hippo, MD;  Location: [facility];  Service: Endoscopy;  Laterality: N/A;  . left rotator cuff      Prior to Admission medications   Medication Sig Start Date End Date Taking? Authorizing Provider  Calcium-Magnesium-Zinc [phone] MG TABS Take by mouth.   Yes [provider]  Cholecalciferol (VITAMIN D3) 10 MCG (400 UNIT) CAPS Take 1,000 mg by mouth daily.    Yes [provider]  co-enzyme Q-10 30 MG capsule Take 30 mg by mouth daily.    Yes [provider]  esomeprazole (NEXIUM) 20 MG capsule Take 1 capsule (20 mg total) by mouth at bedtime. 09/01/19  Yes Erick Blinks, MD  Ferrous Sulfate (IRON) 325 (65 Fe) MG TABS Take 1 tablet (325 mg total) by mouth daily at 2 PM. 09/01/19  Yes Memon, Durward Mallard, MD  loperamide (IMODIUM) 2 MG capsule Take 2 mg by mouth as needed for diarrhea or loose stools.   Yes [provider]  Omega-3 Fatty Acids (FISH OIL PO) Take 1 capsule by mouth daily.   Yes [provider]  sertraline (ZOLOFT) 25 MG tablet Take 25 mg by mouth daily.   Yes [provider]  thyroid (ARMOUR) 120 MG tablet Take 120 mg by mouth daily before breakfast. Take 1 tablet by mouth each morning upon awakening. Do not eat until 1 hour after taking medication.   Yes [provider]  Turmeric 400 MG CAPS Take 400 mg by mouth daily.    Yes [provider]  Wheat Dextrin (BENEFIBER DRINK MIX PO) Take 1 packet by mouth daily. With breakfast   Yes [provider]    Current Facility-Administered Medications  Medication Dose Route Frequency Provider Last Rate Last Dose  . 0.9 %  sodium chloride infusion (Manually program via Guardrails IV Fluids)   Intravenous Once Manuella Ghazi, Pratik D, DO      . acetaminophen (TYLENOL) tablet 650 mg  650 mg Oral Q6H PRN Manuella Ghazi, Pratik D, DO       Or  . acetaminophen (TYLENOL) suppository 650 mg  650 mg Rectal Q6H PRN Manuella Ghazi, Pratik D, DO      . ferrous sulfate tablet 325 mg  325 mg Oral Q1400 Manuella Ghazi, Pratik D, DO      . lactated ringers infusion   Intravenous Continuous Manuella Ghazi, Pratik D, DO      . loperamide (IMODIUM) capsule 2 mg  2 mg Oral PRN Manuella Ghazi, Pratik D, DO      . ondansetron (ZOFRAN) tablet 4 mg  4 mg Oral Q6H PRN Manuella Ghazi, Pratik D, DO       Or  . ondansetron (ZOFRAN) injection 4 mg  4 mg Intravenous Q6H PRN Manuella Ghazi, Pratik D, DO      . pantoprazole (PROTONIX) injection 40 mg  40 mg Intravenous Q24H Shah, Pratik D, DO      . sertraline (ZOLOFT) tablet 25 mg  25 mg Oral Daily Manuella Ghazi, Pratik D, DO      . [START ON 09/05/2019] thyroid (ARMOUR) tablet 120 mg  120 mg Oral QAC breakfast Manuella Ghazi, Pratik D, DO        Allergies as of 09/04/2019 - Review Complete 09/04/2019  Allergen Reaction Noted  . Ciprofloxacin Other (See Comments) 06/02/2011  . Ciprocin-fluocin-procin [fluocinolone acetonide]  03/22/2011  . Codeine  03/22/2011  . Demerol  03/22/2011  . Penicillins  03/22/2011  . Sulfa antibiotics  03/22/2011    No family history on file.  Social History   Socioeconomic History  . Marital status: Widowed    Spouse name: Not on file  . Number of children: Not on file  . Years of education: Not on file  . Highest education level: Not on file  Occupational History  . Not on file  Social Needs  . Financial resource strain: Not on file  . Food insecurity    Worry: Not on file    Inability: Not  on file  . Transportation needs    Medical: Not on file    Non-medical: Not on file  Tobacco Use  . Smoking status: Never Smoker  . Smokeless tobacco: Never Used  Substance and Sexual Activity  . Alcohol use: Never    Frequency: Never  . Drug use: Never  . Sexual activity: Not on file  Lifestyle  . Physical activity    Days per week: Not on file    Minutes per session: Not on file  . Stress: Not on file  Relationships  .  Social Musicianconnections    Talks on phone: Not on file    Gets together: Not on file    Attends religious service: Not on file    Active member of club or organization: Not on file    Attends meetings of clubs or organizations: Not on file    Relationship status: Not on file  . Intimate partner violence    Fear of current or ex partner: Not on file    Emotionally abused: Not on file    Physically abused: Not on file    Forced sexual activity: Not on file  Other Topics Concern  . Not on file  Social History Narrative  . Not on file    Review of Systems: See HPI, otherwise normal ROS  Physical Exam: Temp:  [97.5 F (36.4 C)] 97.5 F (36.4 C) (11/18 0825) Pulse Rate:  [83-91] 84 (11/18 1515) Resp:  [13-26] 20 (11/18 1515) BP: (107-134)/(51-88) 118/53 (11/18 1515) SpO2:  [100 %] 100 % (11/18 1515) Weight:  [49.5 kg] 49.5 kg (11/18 0827)    Patient is alert and in no acute distress. She has generalized wasting. Conjunctivae is pale.  Sclerae nonicteric. Oropharyngeal mucosa is dry. Neck without masses thyromegaly or lymphadenopathy. Cardiac exam with regular rhythm normal S1-S2.  Faint systolic murmur noted at aortic area. Auscultation lungs reveal vesicular breath sounds bilaterally. Abdomen is symmetrical and flat.  Bowel sounds are normal.  On palpation abdomen is soft and nontender with organomegaly or masses. She has trace edema around ankles.   Lab Results: Recent Labs    09/04/19 0837 09/04/19 1405  WBC 11.3*  --   HGB 7.8* 6.9*  HCT  26.0* 23.9*  PLT 202  --    BMET Recent Labs    09/04/19 0837  NA 138  K 3.4*  CL 106  CO2 22  GLUCOSE 138*  BUN 11  CREATININE 0.69  CALCIUM 8.2*    Assessment;  Recurrent GI bleed in a patient was 85 years old and recently discharged for same.  She received 3 units of PRBCs during that admission.  Work-up included EGD and colonoscopy.  While EGD revealed short segment Barrett's and hiatal hernia and portal gastropathy but she had multiple diverticula at sigmoid colon felt to be the source of bleeding.  Suspect recurrent colonic diverticular bleed.  Other diagnostic possibilities would be small bowel bleed from angiodysplasia or mesenteric varices given that she has portal hypertension.  My index of suspicion is low.  Her hemoglobin has dropped again and she will need blood transfusion.  Patient has not had a bowel movement since admission.  If there is evidence of further bleeding will consider GI bleeding scan. May also consider repeating colonoscopy with therapeutic intention.  If bleeding becomes torrential and continuous she would need abdominal angiography.  Surgical option is not a good option but that she has Sjogren's syndrome and cirrhosis.  Cirrhosis.  Cirrhosis was diagnosed during her recent admission last week.  Work-up was negative.  She may have cryptogenic cirrhosis or cirrhosis secondary to Sjogren's syndrome. On exam she does not appear to have ascites.  Patient's condition discussed with her daughter Eunice BlaseDebbie.  Recommendations;  Clear liquids. Serial H&H. GI bleeding scan if evidence of recurrent bleed.    LOS: 0 days      09/04/2019, 4:55 PM

## 2019-09-04 NOTE — Telephone Encounter (Signed)
Daughter called stated patient had an EGD 11/13 wanted NUR to know that she is in the ED with a lower GI bleed

## 2019-09-04 NOTE — ED Triage Notes (Signed)
PT brought in by [facility] today for copious amount of bright red GI bleeding that started this am. PT states she had recent admission for same diagnosis and was given 3 units of blood. PT pale in color and c/o generalized weakness.

## 2019-09-04 NOTE — ED Provider Notes (Addendum)
[facility] EMERGENCY DEPARTMENT Provider Note   CSN: [id number] Arrival date & time: 09/04/19  [id number]     History   Chief Complaint Chief Complaint  Patient presents with  . GI Bleeding    HPI [name] is a 85 y.o. female.     Level 5 caveat for acuity of condition.  Chief complaint bright red blood in toilet water.  Patient recently admitted to [facility] on 08/27/2019 with bloody diarrhea.  Hemoglobin 5.3 at that time.  EGD and colonoscopy on 08/30/2019 by Dr. Karilyn Cotaehman revealing diverticular disease in the sigmoid colon.  Other medical problems include hypothyroidism, Sjogren's syndrome, cirrhosis.  Patient feels weak.     Past Medical History:  Diagnosis Date  . GI bleed   . Hypertension   . Thyroid disease    hypothyroidism    Patient Active Problem List   Diagnosis Date Noted  . Acute blood loss anemia 09/01/2019  . Lower GI bleed 09/01/2019  . Syncope 09/01/2019  . Hypothyroidism 09/01/2019  . Hypokalemia 09/01/2019  . Chronic diarrhea 09/01/2019  . Symptomatic anemia 08/27/2019  . Portal hypertension --- liver cirrhosis 08/27/2019  . Cirrhosis of liver with ascites and portal hypertension 08/27/2019  . Diarrhea 04/11/2019    Past Surgical History:  Procedure Laterality Date  . COLONOSCOPY WITH ESOPHAGOGASTRODUODENOSCOPY (EGD) AND ESOPHAGEAL DILATION (ED)     years ago  . complete hysterectomy    . left rotator cuff       OB History   No obstetric history on file.      Home Medications    Prior to Admission medications   Medication Sig Start Date End Date Taking? Authorizing Provider  CALCIUM MAGNESIUM 750 PO Take 1 tablet by mouth daily.     [provider]  Calcium-Magnesium-Zinc [phone] MG TABS Take by mouth.    [provider]  Cholecalciferol (VITAMIN D3) 10 MCG (400 UNIT) CAPS Take 1,000 mg by mouth daily.     [provider]  co-enzyme Q-10 30 MG capsule Take 30 mg by mouth daily.     [provider]  esomeprazole (NEXIUM) 20 MG capsule Take 1 capsule (20 mg total) by mouth at bedtime. 09/01/19   Erick BlinksMemon, Jehanzeb, MD  Ferrous Sulfate (IRON) 325 (65 Fe) MG TABS Take 1 tablet (325 mg total) by mouth daily at 2 PM. 09/01/19   Erick BlinksMemon, Jehanzeb, MD  loperamide (IMODIUM) 2 MG capsule Take 2 mg by mouth as needed for diarrhea or loose stools.    [provider]  Omega-3 Fatty Acids (FISH OIL PO) Take 1 capsule by mouth daily.    [provider]  sertraline (ZOLOFT) 25 MG tablet Take 25 mg by mouth daily.    [provider]  thyroid (ARMOUR) 120 MG tablet Take 120 mg by mouth daily before breakfast. 120 one day and 90mg  the next day.    [provider]  Turmeric (QC TUMERIC COMPLEX PO) Take 1 capsule by mouth daily.    [provider]  Turmeric 400 MG CAPS Take 400 mg by mouth daily.     [provider]    Family History No family history on file.  Social History Social History   Tobacco Use  . Smoking status: Never Smoker  . Smokeless tobacco: Never Used  Substance Use Topics  . Alcohol use: Never    Frequency: Never  . Drug use: Never     Allergies   Ciprofloxacin, Ciprocin-fluocin-procin [fluocinolone acetonide], Codeine, Demerol,  Penicillins, and Sulfa antibiotics   Review of Systems Review of Systems  Unable to perform ROS: Acuity of condition     Physical Exam Updated Vital Signs BP (!) 107/56   Temp (!) 97.5 F (36.4 C) (Oral)   Resp (!) 26   Ht 5' (1.524 m)   Wt 49.5 kg   SpO2 100%   BMI 21.31 kg/m   Physical Exam Vitals signs and nursing note reviewed.  Constitutional:      Appearance: She is well-developed.     Comments: Pale, answers questions feebly.  HENT:     Head: Normocephalic and atraumatic.  Eyes:     Conjunctiva/sclera: Conjunctivae normal.  Neck:     Musculoskeletal: Neck supple.  Cardiovascular:     Rate and Rhythm: Normal rate and regular rhythm.  Pulmonary:     Effort:  Pulmonary effort is normal.     Breath sounds: Normal breath sounds.  Abdominal:     General: Bowel sounds are normal.     Palpations: Abdomen is soft.  Genitourinary:    Comments: Hemoccult positive. Musculoskeletal: Normal range of motion.  Skin:    General: Skin is warm and dry.  Neurological:     General: No focal deficit present.     Mental Status: She is alert and oriented to person, place, and time.  Psychiatric:        Behavior: Behavior normal.      ED Treatments / Results  Labs (all labs ordered are listed, but only abnormal results are displayed) Labs Reviewed  CBC WITH DIFFERENTIAL/PLATELET - Abnormal; Notable for the following components:      Result Value   WBC 11.3 (*)    RBC 2.71 (*)    Hemoglobin 7.8 (*)    HCT 26.0 (*)    RDW 18.2 (*)    Neutro Abs 8.9 (*)    Monocytes Absolute 1.1 (*)    All other components within normal limits  BASIC METABOLIC PANEL - Abnormal; Notable for the following components:   Potassium 3.4 (*)    Glucose, Bld 138 (*)    Calcium 8.2 (*)    All other components within normal limits  POC OCCULT BLOOD, ED - Abnormal; Notable for the following components:   Fecal Occult Bld POSITIVE (*)    All other components within normal limits  SARS CORONAVIRUS 2 (TAT 6-24 HRS)  TYPE AND SCREEN    EKG None  Radiology No results found.  Procedures Procedures (including critical care time)  Medications Ordered in ED Medications  sodium chloride 0.9 % bolus 500 mL (500 mLs Intravenous New Bag/Given 09/04/19 0846)     Initial Impression / Assessment and Plan / ED Course  I have reviewed the triage vital signs and the nursing notes.  Pertinent labs & imaging results that were available during my care of the patient were reviewed by me and considered in my medical decision making (see chart for details).        Lower GI bleed with anemia (hemoglobin 7.3).  Will consult gastroenterology.  Admit to general medicine.    CRITICAL CARE Performed by: Donnetta Hutching Total critical care time: 30 minutes Critical care time was exclusive of separately billable procedures and treating other patients. Critical care was necessary to treat or prevent imminent or life-threatening deterioration. Critical care was time spent personally by me on the following activities: development of treatment plan with patient and/or surrogate as well as nursing, discussions with consultants, evaluation of patient's response to  treatment, examination of patient, obtaining history from patient or surrogate, ordering and performing treatments and interventions, ordering and review of laboratory studies, ordering and review of radiographic studies, pulse oximetry and re-evaluation of patient's condition.  Final Clinical Impressions(s) / ED Diagnoses   Final diagnoses:  Lower GI bleed  Anemia, unspecified type    ED Discharge Orders    None       Nat Christen, MD 09/04/19 1540    Nat Christen, MD 09/05/19 [phone]

## 2019-09-04 NOTE — Telephone Encounter (Signed)
Dr.Rehman was sent a message. 

## 2019-09-04 NOTE — ED Notes (Signed)
CRITICAL VALUE ALERT  Critical Value:  hgb 6.9  Date & Time Notied: 0932 09/04/19  Provider Notified: shah  Orders Received/Actions taken: will order blood

## 2019-09-04 NOTE — H&P (Addendum)
History and Physical    [name] VQM:[id number] DOB: [birth year] DOA: 09/04/2019  PCP: Sharilyn Sites, MD   Patient coming from: Home  Chief Complaint: Rectal bleeding  HPI: [name] is a 85 y.o. female with medical history significant for hypothyroidism, Sjogren's syndrome, liver cirrhosis with portal hypertension, depression, chronic diarrhea, and recent discharge on 11/15 for acute blood loss anemia due to diverticular GI bleed who presented back to the ED with bright red blood in her toilet bowl.  She was recently transfused 3 units of PRBCs and was discharged with a hemoglobin of 9.5 and now returns with hemoglobin 7.8.  Vital signs are stable and patient denies any abdominal pain, nausea, vomiting, chest pain, shortness of breath, or other symptoms.   ED Course: Vital signs stable and hemoglobin notable for level of 7.8 compared to 9.5 on recent discharge.  She is also noted to have potassium of 3.4.  She denies any current symptomatology.  EDP had spoken with GI Dr. Laural Golden who states that there is likely not much more to offer at this point in time, but does recommend admission for further evaluation.  Review of Systems: All others reviewed and otherwise negative except as noted above.  Past Medical History:  Diagnosis Date  . GI bleed   . Hypertension   . Thyroid disease    hypothyroidism    Past Surgical History:  Procedure Laterality Date  . COLONOSCOPY WITH ESOPHAGOGASTRODUODENOSCOPY (EGD) AND ESOPHAGEAL DILATION (ED)     years ago  . COLONOSCOPY WITH PROPOFOL N/A 08/30/2019   Procedure: COLONOSCOPY WITH PROPOFOL;  Surgeon: Rogene Houston, MD;  Location: [facility];  Service: Endoscopy;  Laterality: N/A;  . complete hysterectomy    . ESOPHAGOGASTRODUODENOSCOPY (EGD) WITH PROPOFOL N/A 08/30/2019   Procedure: ESOPHAGOGASTRODUODENOSCOPY (EGD) WITH PROPOFOL;  Surgeon: Rogene Houston, MD;  Location: [facility];  Service: Endoscopy;  Laterality: N/A;  .  left rotator cuff       reports that she has never smoked. She has never used smokeless tobacco. She reports that she does not drink alcohol or use drugs.  Allergies  Allergen Reactions  . Ciprofloxacin Other (See Comments)    NO REACTION LISTED  . Ciprocin-Fluocin-Procin [Fluocinolone Acetonide]   . Codeine   . Demerol   . Penicillins     Did it involve swelling of the face/tongue/throat, SOB, or low BP? Unknown Did it involve sudden or severe rash/hives, skin peeling, or any reaction on the inside of your mouth or nose? Unknown Did you need to seek medical attention at a hospital or doctor's office? Yes When did it last happen?Uknown If all above answers are "NO", may proceed with cephalosporin use. No issue with ceftriaxone  . Sulfa Antibiotics     No family history on file.  Prior to Admission medications   Medication Sig Start Date End Date Taking? Authorizing Provider  Calcium-Magnesium-Zinc [phone] MG TABS Take by mouth.   Yes [provider]  Cholecalciferol (VITAMIN D3) 10 MCG (400 UNIT) CAPS Take 1,000 mg by mouth daily.    Yes [provider]  co-enzyme Q-10 30 MG capsule Take 30 mg by mouth daily.    Yes [provider]  esomeprazole (NEXIUM) 20 MG capsule Take 1 capsule (20 mg total) by mouth at bedtime. 09/01/19  Yes Kathie Dike, MD  Ferrous Sulfate (IRON) 325 (65 Fe) MG TABS Take 1 tablet (325 mg total) by mouth daily at 2 PM. 09/01/19  Yes Memon, [place],  MD  loperamide (IMODIUM) 2 MG capsule Take 2 mg by mouth as needed for diarrhea or loose stools.   Yes [provider]  Omega-3 Fatty Acids (FISH OIL PO) Take 1 capsule by mouth daily.   Yes [provider]  sertraline (ZOLOFT) 25 MG tablet Take 25 mg by mouth daily.   Yes [provider]  thyroid (ARMOUR) 120 MG tablet Take 120 mg by mouth daily before breakfast. Take 1 tablet by mouth each morning upon awakening. Do not eat until 1 hour after  taking medication.   Yes [provider]  Turmeric 400 MG CAPS Take 400 mg by mouth daily.    Yes [provider]  Wheat Dextrin (BENEFIBER DRINK MIX PO) Take 1 packet by mouth daily. With breakfast   Yes [provider]    Physical Exam: Vitals:   09/04/19 1130 09/04/19 1145 09/04/19 1215 09/04/19 1315  BP: (!) 132/56 (!) 132/56 (!) 130/55 (!) 120/58  Pulse:  86 89 91  Resp: 19 19 (!) 24 16  Temp:      TempSrc:      SpO2:  100% 100% 100%  Weight:      Height:        Constitutional: NAD, calm, comfortable Vitals:   09/04/19 1130 09/04/19 1145 09/04/19 1215 09/04/19 1315  BP: (!) 132/56 (!) 132/56 (!) 130/55 (!) 120/58  Pulse:  86 89 91  Resp: 19 19 (!) 24 16  Temp:      TempSrc:      SpO2:  100% 100% 100%  Weight:      Height:       Eyes: lids and conjunctivae normal ENMT: Mucous membranes are moist.  Neck: normal, supple Respiratory: clear to auscultation bilaterally. Normal respiratory effort. No accessory muscle use.  Currently on 2 L nasal cannula oxygen. Cardiovascular: Regular rate and rhythm, no murmurs. No extremity edema. Abdomen: no tenderness, no distention. Bowel sounds positive.  Musculoskeletal:  No joint deformity upper and lower extremities.   Skin: no rashes, lesions, ulcers.  Psychiatric: Flat affect.  Labs on Admission: I have personally reviewed following labs and imaging studies  CBC: Recent Labs  Lab 08/29/19 0431 08/30/19 0357 08/30/19 1759 08/31/19 0743 09/01/19 0654 09/04/19 0837  WBC 11.5* 7.8  --  5.9 4.7 11.3*  NEUTROABS 9.5*  --   --   --   --  8.9*  HGB 8.9* 7.6* 10.3* 9.9* 9.5* 7.8*  HCT 28.8* 24.8* 33.1* 32.0* 30.5* 26.0*  MCV 95.7 96.9  --  94.4 93.8 95.9  PLT 173 157  --  172 153 202   Basic Metabolic Panel: Recent Labs  Lab 08/29/19 0230 08/29/19 0431 08/30/19 0357 09/04/19 0837  NA 131*  --  135 138  K 3.3*  --  4.5 3.4*  CL 105  --  106 106  CO2 19*  --  23 22  GLUCOSE 110*  --  121*  138*  BUN 11  --  8 11  CREATININE 0.68  --  0.73 0.69  CALCIUM 7.8*  --  8.1* 8.2*  MG  --  1.6*  --   --    GFR: Estimated Creatinine Clearance: 37.6 mL/min (by C-G formula based on SCr of 0.69 mg/dL). Liver Function Tests: Recent Labs  Lab 08/30/19 0357  AST 32  ALT 18  ALKPHOS 50  BILITOT 0.7  PROT 4.7*  ALBUMIN 2.3*   No results for input(s): LIPASE, AMYLASE in the last 168 hours. No  results for input(s): AMMONIA in the last 168 hours. Coagulation Profile: Recent Labs  Lab 08/31/19 0743  INR 1.1   Cardiac Enzymes: No results for input(s): CKTOTAL, CKMB, CKMBINDEX, TROPONINI in the last 168 hours. BNP (last 3 results) No results for input(s): PROBNP in the last 8760 hours. HbA1C: No results for input(s): HGBA1C in the last 72 hours. CBG: No results for input(s): GLUCAP in the last 168 hours. Lipid Profile: No results for input(s): CHOL, HDL, LDLCALC, TRIG, CHOLHDL, LDLDIRECT in the last 72 hours. Thyroid Function Tests: Recent Labs    09/04/19 0837  TSH 18.390*   Anemia Panel: No results for input(s): VITAMINB12, FOLATE, FERRITIN, TIBC, IRON, RETICCTPCT in the last 72 hours. Urine analysis: No results found for: COLORURINE, APPEARANCEUR, LABSPEC, PHURINE, GLUCOSEU, HGBUR, BILIRUBINUR, KETONESUR, PROTEINUR, UROBILINOGEN, NITRITE, LEUKOCYTESUR  Radiological Exams on Admission: No results found.   Assessment/Plan Active Problems:   Acute blood loss anemia    Acute blood loss anemia likely due to ongoing diverticular GI bleeding -Patient noted to have chronic iron deficiency and will continue supplementation -Appreciate GI evaluation and recommendations with recent discharge noted on 11/15 -Repeat hemoglobin and hematocrit and monitor closely -Transfuse for hemoglobin less than 7 -Avoid heparin agents -PPI IV daily -NPO except meds until GI evaluation -IVF  Hypokalemia -Likely related to chronic diarrhea -Replete and reevaluate in a.m.  Liver  cirrhosis -Patient has evidence of portal hypertension -Continue outpatient follow-up  Hypothyroidism -Continue Armour Thyroid  Depression -Continue sertraline  Chronic diarrhea -Continue on Imodium -Infectious work-up earlier this year was negative   DVT prophylaxis: SCDs Code Status: Full Family Communication: None at bedside Disposition Plan: GI evaluation Consults called: GI-Dr. Karilyn Cotaehman Admission status: Inpatient, tele   Asha Grumbine D Dajanee Voorheis DO [facility]  If 7PM-7AM, please contact night-coverage [email or web address]   09/04/2019, 1:31 PM

## 2019-09-04 NOTE — ED Notes (Signed)
Pt and daughter refusing to go to ICU do to incident with a nurse that occurred with her last stay. Notified dr Manuella Ghazi will change to tele unless condition deteriorates

## 2019-09-05 ENCOUNTER — Other Ambulatory Visit (INDEPENDENT_AMBULATORY_CARE_PROVIDER_SITE_OTHER): Payer: Self-pay | Admitting: *Deleted

## 2019-09-05 DIAGNOSIS — R197 Diarrhea, unspecified: Secondary | ICD-10-CM

## 2019-09-05 LAB — TYPE AND SCREEN
ABO/RH(D): A POS
Antibody Screen: NEGATIVE
Unit division: 0
Unit division: 0

## 2019-09-05 LAB — CBC
HCT: 29.5 % — ABNORMAL LOW (ref 36.0–46.0)
Hemoglobin: 9.4 g/dL — ABNORMAL LOW (ref 12.0–15.0)
MCH: 29.4 pg (ref 26.0–34.0)
MCHC: 31.9 g/dL (ref 30.0–36.0)
MCV: 92.2 fL (ref 80.0–100.0)
Platelets: 138 10*3/uL — ABNORMAL LOW (ref 150–400)
RBC: 3.2 MIL/uL — ABNORMAL LOW (ref 3.87–5.11)
RDW: 16.1 % — ABNORMAL HIGH (ref 11.5–15.5)
WBC: 7.1 10*3/uL (ref 4.0–10.5)
nRBC: 0 % (ref 0.0–0.2)

## 2019-09-05 LAB — BPAM RBC
Blood Product Expiration Date: 202012052359
Blood Product Expiration Date: 202012052359
ISSUE DATE / TIME: 202011181757
ISSUE DATE / TIME: 202011182212
Unit Type and Rh: 600
Unit Type and Rh: 600

## 2019-09-05 LAB — BASIC METABOLIC PANEL
Anion gap: 5 (ref 5–15)
BUN: 12 mg/dL (ref 8–23)
CO2: 21 mmol/L — ABNORMAL LOW (ref 22–32)
Calcium: 8 mg/dL — ABNORMAL LOW (ref 8.9–10.3)
Chloride: 110 mmol/L (ref 98–111)
Creatinine, Ser: 0.57 mg/dL (ref 0.44–1.00)
GFR calc Af Amer: >60 mL/min (ref >60)
GFR calc non Af Amer: >60 mL/min (ref >60)
Glucose, Bld: 85 mg/dL (ref 70–99)
Potassium: 3.8 mmol/L (ref 3.5–5.1)
Sodium: 136 mmol/L (ref 135–145)

## 2019-09-05 LAB — MAGNESIUM: Magnesium: 1.7 mg/dL (ref 1.7–2.4)

## 2019-09-05 MED ORDER — NYSTATIN 100000 UNIT/ML MT SUSP
5.0000 mL | Freq: Four times a day (QID) | OROMUCOSAL | Status: DC
  Administered 2019-09-05 – 2019-09-06 (×3): 500000 [IU] via ORAL
  Filled 2019-09-05 (×2): qty 5

## 2019-09-05 NOTE — Progress Notes (Signed)
PROGRESS NOTE    [name]  TJQ:[id number] DOB: Dec 23, 1932 DOA: 09/04/2019 PCP: Assunta Found, MD   Brief Narrative:  Per HPI: [name] is a 85 y.o. female with medical history significant for hypothyroidism, Sjogren's syndrome, liver cirrhosis with portal hypertension, depression, chronic diarrhea, and recent discharge on 11/15 for acute blood loss anemia due to diverticular GI bleed who presented back to the ED with bright red blood in her toilet bowl.  She was recently transfused 3 units of PRBCs and was discharged with a hemoglobin of 9.5 and now returns with hemoglobin 7.8.  Vital signs are stable and patient denies any abdominal pain, nausea, vomiting, chest pain, shortness of breath, or other symptoms.  11/19: Patient has had no further rectal bleeding this morning and anemia has appropriately improved with 2 unit PRBC transfusion to hemoglobin at 9.4.  GI advancing diet to full liquids.  Plan recheck labs in a.m.  Further imaging studies per GI as noted.  Assessment & Plan:   Active Problems:   Acute blood loss anemia   Acute blood loss anemia likely due to ongoing diverticular GI bleeding -Improved status post 2 unit PRBC transfusion -Patient noted to have chronic iron deficiency and will continue supplementation -Appreciate GI evaluation and recommendations with recent discharge noted on 11/15 -Repeat CBC in a.m. -Avoid heparin agents -PPI IV daily -Advance diet per GI -DC IV fluids  Hypokalemia-resolved -Likely related to chronic diarrhea -Recheck BMP in a.m.  Liver cirrhosis -Patient has evidence of portal hypertension -Continue outpatient follow-up  Hypothyroidism -Continue Armour Thyroid  Depression -Continue sertraline  Chronic diarrhea -Continue on Imodium -Infectious work-up earlier this year was negative   DVT prophylaxis: SCDs Code Status: Full Family Communication: None at bedside Disposition Plan: Per GI.  Plan recheck labs  in a.m.  Anticipate discharge if stable.   Consultants:   GI  Procedures:   None  Antimicrobials:   None   Subjective: Patient seen and evaluated today with no new acute complaints or concerns. No acute concerns or events noted overnight.  She has had a bowel movement that was brown with no signs of overt bleeding noted.  Objective: Vitals:   09/04/19 2012 09/04/19 2157 09/04/19 2237 09/05/19 0058  BP:  (!) 132/58 135/63 137/64  Pulse:  92 93 98  Resp:  18 18 16   Temp:  98.6 F (37 C) 98.4 F (36.9 C) 98.2 F (36.8 C)  TempSrc:  Oral Oral Oral  SpO2: 98% 100% 99% 100%  Weight:      Height:        Intake/Output Summary (Last 24 hours) at 09/05/2019 1450 Last data filed at 09/04/2019 1803 Gross per 24 hour  Intake 315 ml  Output -  Net 315 ml   Filed Weights   09/04/19 0827  Weight: 49.5 kg    Examination:  General exam: Appears calm and comfortable  Respiratory system: Clear to auscultation. Respiratory effort normal. Cardiovascular system: S1 & S2 heard, RRR. No JVD, murmurs, rubs, gallops or clicks. No pedal edema. Gastrointestinal system: Abdomen is nondistended, soft and nontender. No organomegaly or masses felt. Normal bowel sounds heard. Central nervous system: Alert and oriented. No focal neurological deficits. Extremities: Symmetric 5 x 5 power. Skin: No rashes, lesions or ulcers Psychiatry: Judgement and insight appear normal. Mood & affect appropriate.     Data Reviewed: I have personally reviewed following labs and imaging studies  CBC: Recent Labs  Lab 08/30/19 0357  08/31/19 0743 09/01/19 0654 09/04/19  [id number] 09/04/19 1405 09/05/19 0315  WBC 7.8  --  5.9 4.7 11.3*  --  7.1  NEUTROABS  --   --   --   --  8.9*  --   --   HGB 7.6*   < > 9.9* 9.5* 7.8* 6.9* 9.4*  HCT 24.8*   < > 32.0* 30.5* 26.0* 23.9* 29.5*  MCV 96.9  --  94.4 93.8 95.9  --  92.2  PLT 157  --  172 153 202  --  138*   < > = values in this interval not displayed.    Basic Metabolic Panel: Recent Labs  Lab 08/30/19 0357 09/04/19 0837 09/05/19 0315  NA 135 138 136  K 4.5 3.4* 3.8  CL 106 106 110  CO2 23 22 21*  GLUCOSE 121* 138* 85  BUN 8 11 12   CREATININE 0.73 0.69 0.57  CALCIUM 8.1* 8.2* 8.0*  MG  --   --  1.7   GFR: Estimated Creatinine Clearance: 37.6 mL/min (by C-G formula based on SCr of 0.57 mg/dL). Liver Function Tests: Recent Labs  Lab 08/30/19 0357  AST 32  ALT 18  ALKPHOS 50  BILITOT 0.7  PROT 4.7*  ALBUMIN 2.3*   No results for input(s): LIPASE, AMYLASE in the last 168 hours. No results for input(s): AMMONIA in the last 168 hours. Coagulation Profile: Recent Labs  Lab 08/31/19 0743  INR 1.1   Cardiac Enzymes: No results for input(s): CKTOTAL, CKMB, CKMBINDEX, TROPONINI in the last 168 hours. BNP (last 3 results) No results for input(s): PROBNP in the last 8760 hours. HbA1C: No results for input(s): HGBA1C in the last 72 hours. CBG: No results for input(s): GLUCAP in the last 168 hours. Lipid Profile: No results for input(s): CHOL, HDL, LDLCALC, TRIG, CHOLHDL, LDLDIRECT in the last 72 hours. Thyroid Function Tests: Recent Labs    09/04/19 0837  TSH 18.390*   Anemia Panel: No results for input(s): VITAMINB12, FOLATE, FERRITIN, TIBC, IRON, RETICCTPCT in the last 72 hours. Sepsis Labs: No results for input(s): PROCALCITON, LATICACIDVEN in the last 168 hours.  Recent Results (from the past 240 hour(s))  SARS CORONAVIRUS 2 (TAT 6-24 HRS) Nasopharyngeal Nasopharyngeal Swab     Status: None   Collection Time: 08/27/19  2:14 PM   Specimen: Nasopharyngeal Swab  Result Value Ref Range Status   SARS Coronavirus 2 NEGATIVE NEGATIVE Final    Comment: (NOTE) SARS-CoV-2 target nucleic acids are NOT DETECTED. The SARS-CoV-2 RNA is generally detectable in upper and lower respiratory specimens during the acute phase of infection. Negative results do not preclude SARS-CoV-2 infection, do not rule out co-infections with  other pathogens, and should not be used as the sole basis for treatment or other patient management decisions. Negative results must be combined with clinical observations, patient history, and epidemiological information. The expected result is Negative. Fact Sheet for Patients: HairSlick.nohttps://www.fda.gov/media/138098/download Fact Sheet for Healthcare Providers: quierodirigir.comhttps://www.fda.gov/media/138095/download This test is not yet approved or cleared by the [place] FDA and  has been authorized for detection and/or diagnosis of SARS-CoV-2 by FDA under an Emergency Use Authorization (EUA). This EUA will remain  in effect (meaning this test can be used) for the duration of the COVID-19 declaration under Section 56 4(b)(1) of the Act, 21 U.S.C. section 360bbb-3(b)(1), unless the authorization is terminated or revoked sooner. Performed at [facility], [address] [place], [place] [address]   MRSA PCR Screening     Status: None   Collection Time: 08/27/19  10:58 PM   Specimen: Nasal Mucosa; Nasopharyngeal  Result Value Ref Range Status   MRSA by PCR NEGATIVE NEGATIVE Final    Comment:        The GeneXpert MRSA Assay (FDA approved for NASAL specimens only), is one component of a comprehensive MRSA colonization surveillance program. It is not intended to diagnose MRSA infection nor to guide or monitor treatment for MRSA infections. Performed at [facility], [address] [place], [place] [address]   SARS CORONAVIRUS 2 (TAT 6-24 HRS) Nasopharyngeal Nasopharyngeal Swab     Status: None   Collection Time: 09/04/19  8:21 AM   Specimen: Nasopharyngeal Swab  Result Value Ref Range Status   SARS Coronavirus 2 NEGATIVE NEGATIVE Final    Comment: (NOTE) SARS-CoV-2 target nucleic acids are NOT DETECTED. The SARS-CoV-2 RNA is generally detectable in upper and lower respiratory specimens during the acute phase of infection. Negative results do not preclude SARS-CoV-2 infection, do not  rule out co-infections with other pathogens, and should not be used as the sole basis for treatment or other patient management decisions. Negative results must be combined with clinical observations, patient history, and epidemiological information. The expected result is Negative. Fact Sheet for Patients: SugarRoll.be Fact Sheet for Healthcare Providers: https://www.woods-mathews.com/ This test is not yet approved or cleared by the [place] FDA and  has been authorized for detection and/or diagnosis of SARS-CoV-2 by FDA under an Emergency Use Authorization (EUA). This EUA will remain  in effect (meaning this test can be used) for the duration of the COVID-19 declaration under Section 56 4(b)(1) of the Act, 21 U.S.C. section 360bbb-3(b)(1), unless the authorization is terminated or revoked sooner. Performed at [facility], [place] [address] [place], [place] [address]          Radiology Studies: No results found.      Scheduled Meds: . sodium chloride   Intravenous Once  . ferrous sulfate  325 mg Oral Q1400  . pantoprazole (PROTONIX) IV  40 mg Intravenous Q24H  . sertraline  25 mg Oral Daily  . thyroid  120 mg Oral QAC breakfast   Continuous Infusions:   LOS: 1 day    Time spent: 30 minutes    Jarl Sellitto Darleen Crocker, DO [facility] Pager [phone]  If 7PM-7AM, please contact night-coverage [email or web address] Password [email or web address] 09/05/2019, 2:50 PM

## 2019-09-05 NOTE — Progress Notes (Addendum)
CC: GI bleed, anemia, cirrhosis  Subjective: She slept well last night.  She passed a moderate formed brown bowel movement earlier this morning.  She is tolerating a clear liquid diet.  She denies having any nausea or vomiting.  No abdominal pain.  Objective:  Vital signs in last 24 hours: Temp:  [97.5 F (36.4 C)-98.6 F (37 C)] 98.2 F (36.8 C) (11/19 0058) Pulse Rate:  [66-98] 98 (11/19 0058) Resp:  [13-26] 16 (11/19 0058) BP: (107-137)/(51-88) 137/64 (11/19 0058) SpO2:  [92 %-100 %] 100 % (11/19 0058) Weight:  [49.5 kg] 49.5 kg (11/18 0827) Last BM Date: 09/04/19 General:   Alert petite 85 year old female in no acute distress. Heart: Regular rate and rhythm, systolic murmur. Pulm: Few crackles in the right lower base otherwise throughout. Abdomen: Protuberant, no ascites appreciated, lower midline scar intact, hypoactive bowel sounds to all 4 quadrants, no HSM. Extremities:  Without edema. Neurologic:  Alert and  oriented x4;  grossly normal neurologically.  No asterixis. Psych:  Alert and cooperative. Normal mood and affect.  Intake/Output from previous day: 11/18 0701 - 11/19 0700 In: 815 [Blood:315; IV Piggyback:500] Out: -  Intake/Output this shift: No intake/output data recorded.  Lab Results: Recent Labs    09/04/19 0837 09/04/19 1405 09/05/19 0315  WBC 11.3*  --  7.1  HGB 7.8* 6.9* 9.4*  HCT 26.0* 23.9* 29.5*  PLT 202  --  138*   BMET Recent Labs    09/04/19 0837 09/05/19 0315  NA 138 136  K 3.4* 3.8  CL 106 110  CO2 22 21*  GLUCOSE 138* 85  BUN 11 12  CREATININE 0.69 0.57  CALCIUM 8.2* 8.0*   LFT No results for input(s): PROT, ALBUMIN, AST, ALT, ALKPHOS, BILITOT, BILIDIR, IBILI in the last 72 hours. PT/INR No results for input(s): LABPROT, INR in the last 72 hours. Hepatitis Panel No results for input(s): HEPBSAG, HCVAB, HEPAIGM, HEPBIGM in the last 72 hours.  No results found.  Assessment / Plan:  68.  85 year old female admitted  to [facility] 11/10 with lower GI bleed and anemia.  Hg 5.8.She received 3 units of packed red blood cells. Post transfusion Hg 8.8.  An EGD 11/13 identified short segment Barrett's esophagus and a hiatal hernia with portal hypertensive gastropathy.  A colonoscopy identified multiple diverticula to the sigmoid colon without evidence of active bleeding.  Discharged home 11/15. She was readmitted to the hospital 11/18 with painless hematochezia. Hemoglobin 7.8 down to 6.9.  She received 2 units of PRBCs.  Today's Hg 9.4. She passed a normal brown BM this am. She is hemodynamically stable. Most likely diverticular bleed. -Possible small small bowel capsule endoscopy tomorrow, further recommendations per Dr. Marline Backbone later today -Tagged red blood cell scan to be completed if patient develops recurrence of active GI bleeding versus angiogram by IR at [facility] -H&H every 12 hours for now -Advance to full liquid diet  2.  Cirrhosis, cryptogenic vs due to Sjogren's.   No ascites on exam.  EGD without evidence of esophageal varices. -further evaluation as outpatient recommended   3. GERD -Continue PPI bid  4. Chronic diarrhea  5. Sjogren's Syndrome   Active Problems:   Acute blood loss anemia     LOS: 1 day   [name]  09/05/2019, 8:08 AM  Addendum.  Patient has not experienced any rectal bleeding since she was seen by Ms. Riley Kill, NP. Patient's condition discussed with her daughter [name] who is  at bedside. If she rebleeds options would be to proceed with GI bleeding scan or do another colonoscopy.  Colonoscopy would not be feasible if bleeding is significant. She carries a new diagnosis of cirrhosis which was made last week.  She does not have esophageal or gastric varices.  She also does not have splenomegaly or significant thrombocytopenia.  Therefore I do not believe that she has mesenteric varices. If she has recurrent bleed then one would have to  proceed with further work-up. As I will be out of town starting tomorrow Dr. [facility] will be seeing patient.

## 2019-09-05 NOTE — Progress Notes (Signed)
xcover  Per rn white spots on tongue Try nystatin suspension

## 2019-09-06 LAB — CBC
HCT: 33.5 % — ABNORMAL LOW (ref 36.0–46.0)
Hemoglobin: 10.6 g/dL — ABNORMAL LOW (ref 12.0–15.0)
MCH: 29.2 pg (ref 26.0–34.0)
MCHC: 31.6 g/dL (ref 30.0–36.0)
MCV: 92.3 fL (ref 80.0–100.0)
Platelets: 161 10*3/uL (ref 150–400)
RBC: 3.63 MIL/uL — ABNORMAL LOW (ref 3.87–5.11)
RDW: 16.6 % — ABNORMAL HIGH (ref 11.5–15.5)
WBC: 6.8 10*3/uL (ref 4.0–10.5)
nRBC: 0 % (ref 0.0–0.2)

## 2019-09-06 LAB — BASIC METABOLIC PANEL
Anion gap: 9 (ref 5–15)
BUN: 9 mg/dL (ref 8–23)
CO2: 21 mmol/L — ABNORMAL LOW (ref 22–32)
Calcium: 8.5 mg/dL — ABNORMAL LOW (ref 8.9–10.3)
Chloride: 106 mmol/L (ref 98–111)
Creatinine, Ser: 0.55 mg/dL (ref 0.44–1.00)
GFR calc Af Amer: >60 mL/min (ref >60)
GFR calc non Af Amer: >60 mL/min (ref >60)
Glucose, Bld: 93 mg/dL (ref 70–99)
Potassium: 3.6 mmol/L (ref 3.5–5.1)
Sodium: 136 mmol/L (ref 135–145)

## 2019-09-06 MED ORDER — NYSTATIN 100000 UNIT/ML MT SUSP: 5.0000 mL | Freq: Four times a day (QID) | OROMUCOSAL | 0 refills | Status: AC

## 2019-09-06 MED ORDER — LOPERAMIDE HCL 2 MG PO CAPS
2.0000 mg | ORAL_CAPSULE | Freq: Two times a day (BID) | ORAL | Status: DC
  Administered 2019-09-06: 2 mg via ORAL
  Filled 2019-09-06 (×2): qty 1

## 2019-09-06 NOTE — Care Management Important Message (Signed)
Important Message  Patient Details  Name: [name] MRN: [id number] Date of Birth: [birth year]   Medicare Important Message Given:  Yes     Tommy Medal 09/06/2019, 2:49 PM

## 2019-09-06 NOTE — Discharge Summary (Signed)
Physician Discharge Summary  [name]:[id number] DOB: [birth year] DOA: 09/04/2019  PCP: Sharilyn Sites, MD  Admit date: 09/04/2019  Discharge date: 09/06/2019  Admitted From:Home  Disposition:  Home  Recommendations for Outpatient Follow-up:  1. Follow up with PCP in 1-2 weeks 2. Follow-up with Dr.Rehman in 2 weeks as scheduled for repeat CBC and further consideration of endoscopy or other imaging as needed 3. Continue home medications as prior  Home Health: None  Equipment/Devices: None  Discharge Condition: Stable  CODE STATUS: Full  Diet recommendation: Heart Healthy  Brief/Interim Summary: Per HPI: [name] a 85 y.o.femalewith medical history significant forhypothyroidism, Sjogren's syndrome, liver cirrhosis with portal hypertension, depression, chronic diarrhea, and recent discharge on 11/15 for acute blood loss anemia due to diverticular GI bleed who presented back to the ED with bright red blood in her toilet bowl. She was recently transfused 3 units of PRBCs and was discharged with a hemoglobin of 9.5 and now returns with hemoglobin 7.8. Vital signs are stable and patient denies any abdominal pain, nausea, vomiting, chest pain, shortness of breath, or other symptoms.  11/19: Patient has had no further rectal bleeding this morning and anemia has appropriately improved with 2 unit PRBC transfusion to hemoglobin at 9.4.  GI advancing diet to full liquids.  Plan recheck labs in a.m.  Further imaging studies per GI as noted.  11/20: Patient has had no further rectal bleeding of any kind after multiple bowel movements throughout this admission.  Her hemoglobin remains stable and improved as noted below after 2 units of PRBC transfusion.  Discussed case this morning with her gastroenterologist Dr. Laural Golden who will follow up with patient in the near future and recheck CBC levels.  She was also noted to have some lesions in her mouth reflective of possible  Candida and will be placed on nystatin swish and swallow for the next 7 days.  She is tolerating her diet with no other acute events noted throughout the course of this admission.  Discharge Diagnoses:  Active Problems:   Acute blood loss anemia  Principal discharge diagnosis: Acute blood loss anemia status post PRBC transfusion in the setting of diverticular GI bleed.  Discharge Instructions  Discharge Instructions    Diet - low sodium heart healthy   Complete by: As directed    Increase activity slowly   Complete by: As directed      Allergies as of 09/06/2019      Reactions   Ciprofloxacin Other (See Comments)   NO REACTION LISTED   Ciprocin-fluocin-procin [fluocinolone Acetonide]    Codeine    Demerol    Penicillins    Did it involve swelling of the face/tongue/throat, SOB, or low BP? Unknown Did it involve sudden or severe rash/hives, skin peeling, or any reaction on the inside of your mouth or nose? Unknown Did you need to seek medical attention at a hospital or doctor's office? Yes When did it last happen?Uknown If all above answers are "NO", may proceed with cephalosporin use. No issue with ceftriaxone   Sulfa Antibiotics       Medication List    TAKE these medications   BENEFIBER DRINK MIX PO Take 1 packet by mouth daily. With breakfast   Calcium-Magnesium-Zinc 333-133-5 MG Tabs Take by mouth.   co-enzyme Q-10 30 MG capsule Take 30 mg by mouth daily.   esomeprazole 20 MG capsule Commonly known as: NEXIUM Take 1 capsule (20 mg total) by mouth at bedtime.   FISH OIL PO  Take 1 capsule by mouth daily.   Iron 325 (65 Fe) MG Tabs Take 1 tablet (325 mg total) by mouth daily at 2 PM.   loperamide 2 MG capsule Commonly known as: IMODIUM Take 2 mg by mouth as needed for diarrhea or loose stools.   nystatin 100000 UNIT/ML suspension Commonly known as: MYCOSTATIN Take 5 mLs (500,000 Units total) by mouth 4 (four) times daily for 7 days.   sertraline  25 MG tablet Commonly known as: ZOLOFT Take 25 mg by mouth daily.   thyroid 120 MG tablet Commonly known as: ARMOUR Take 120 mg by mouth daily before breakfast. Take 1 tablet by mouth each morning upon awakening. Do not eat until 1 hour after taking medication.   Turmeric 400 MG Caps Take 400 mg by mouth daily.   Vitamin D3 10 MCG (400 UNIT) Caps Take 1,000 mg by mouth daily.      Follow-up Information    Assunta Found, MD Follow up in 1 week(s).   Specialty: Family Medicine Contact information: [address] [place] [address] [phone]        Malissa Hippo, MD Follow up in 2 week(s).   Specialty: Gastroenterology Contact information: [address], [address] [place] [address] [phone]          Allergies  Allergen Reactions  . Ciprofloxacin Other (See Comments)    NO REACTION LISTED  . Ciprocin-Fluocin-Procin [Fluocinolone Acetonide]   . Codeine   . Demerol   . Penicillins     Did it involve swelling of the face/tongue/throat, SOB, or low BP? Unknown Did it involve sudden or severe rash/hives, skin peeling, or any reaction on the inside of your mouth or nose? Unknown Did you need to seek medical attention at a hospital or doctor's office? Yes When did it last happen?Uknown If all above answers are "NO", may proceed with cephalosporin use. No issue with ceftriaxone  . Sulfa Antibiotics     Consultations:  GI   Procedures/Studies: Cxr  Result Date: 08/27/2019 CLINICAL DATA:  Rectal blood EXAM: CHEST - 2 VIEW COMPARISON:  November 04, 2014 FINDINGS: There is mild cardiomegaly. Aortic knob calcifications. No large airspace consolidation or pleural effusion. There is streaky atelectasis or scarring seen at the left lung base. No acute osseous abnormality. IMPRESSION: Mild cardiomegaly. Streaky atelectasis or scarring at the left lung base. Electronically Signed   By: Jonna Clark M.D.   On: 08/27/2019 21:38   Ct Head Wo  Contrast  Result Date: 08/27/2019 CLINICAL DATA:  cHead trauma, minor, GCS>=13, high clinical risk, initial exam, syncope. C-spine trauma, ligamentous injury suspected. EXAM: CT HEAD WITHOUT CONTRAST CT CERVICAL SPINE WITHOUT CONTRAST TECHNIQUE: Multidetector CT imaging of the head and cervical spine was performed following the standard protocol without intravenous contrast. Multiplanar CT image reconstructions of the cervical spine were also generated. COMPARISON:  Head CT 06/13/2008 FINDINGS: CT HEAD FINDINGS Brain: No evidence of acute intracranial hemorrhage. No demarcated cortical infarction. No evidence of intracranial mass. No midline shift or extra-axial fluid collection. Minimal ill-defined hypoattenuation within the cerebral white matter is nonspecific, but consistent with chronic small vessel ischemic disease. Mild generalized parenchymal atrophy. Vascular: No hyperdense vessel. Skull: Normal. Negative for fracture or focal lesion. Sinuses/Orbits: Visualized orbits demonstrate no acute abnormality. No significant paranasal sinus disease or mastoid effusion. CT CERVICAL SPINE FINDINGS Mildly motion degraded examination. Alignment: No significant spondylolisthesis Skull base and vertebrae: The basion-dental and atlanto-dental intervals are maintained.No evidence of acute fracture to the  cervical spine. Soft tissues and spinal canal: No prevertebral fluid or swelling. No visible canal hematoma. Disc levels: Cervical spondylosis with multilevel posterior disc osteophytes, uncovertebral and facet hypertrophy. Upper chest: Emphysema within the imaged lung apices. No visible pneumothorax. Other: Carotid artery atherosclerotic calcification. IMPRESSION: CT head: 1. No evidence of acute intracranial abnormality. 2. Mild generalized parenchymal atrophy and chronic small vessel ischemic disease CT cervical spine: 1. No evidence of acute fracture to the cervical spine. 2. Cervical spondylosis as described.  Electronically Signed   By: Jackey Loge DO   On: 08/27/2019 14:03   Ct Cervical Spine Wo Contrast  Result Date: 08/27/2019 CLINICAL DATA:  Cervical spine trauma secondary to a fall. The patient was found down in the bathroom. EXAM: CT CERVICAL SPINE WITHOUT CONTRAST TECHNIQUE: Multidetector CT imaging of the cervical spine was performed without intravenous contrast. Multiplanar CT image reconstructions were also generated. COMPARISON:  None. FINDINGS: Alignment: Normal. Skull base and vertebrae: No acute fracture. No primary bone lesion or focal pathologic process. Soft tissues and spinal canal: The prevertebral soft tissues are normal. There is a 15 mm rim calcified nodule in the right lobe of the thyroid gland. No visible canal hematoma. Disc levels: C2-3: Tiny central disc bulge with no neural impingement. Moderate right facet arthritis. No foraminal stenosis. C3-4: Marked disc space narrowing. Small broad-based disc osteophyte complex most prominent centrally. No foraminal stenosis. C4-5: Chronic disc space narrowing. Small disc bulge with accompanying osteophytes to the left of midline. No foraminal stenosis. C5-6: Chronic disc space narrowing. Slight narrowing of the right neural foramen due to uncinate spurs. No disc bulging or protrusion. C6-7: Disc space narrowing. Minimal endplate osteophytes to the left of midline without neural impingement. Widely patent neural foramina. C7-T1: Normal disc. Slight left facet arthritis. No foraminal stenosis. Upper chest: Negative. Other: None IMPRESSION: 1. No acute abnormality of the cervical spine. Multilevel degenerative disc and joint disease. 2. 15 mm rim calcified nodule in the right lobe of the thyroid gland. Electronically Signed   By: Francene Boyers M.D.   On: 08/27/2019 13:57   US Abdomen Complete  Result Date: 08/28/2019 CLINICAL DATA:  Abdominal pain and rectal bleeding. EXAM: ABDOMEN ULTRASOUND COMPLETE COMPARISON:  CT AP 08/27/2019 FINDINGS:  Gallbladder: Gallbladder wall is thickened measuring 7 mm. Negative sonographic Murphy's sign. No stone or sludge identified. Common bile duct: Diameter: 5 mm Liver: Micro nodular contour of the liver is identified with heterogeneous echotexture. Portal vein is patent on color Doppler imaging with normal direction of blood flow towards the liver. IVC: No abnormality visualized. Pancreas: Visualized portion unremarkable. Spleen: Size and appearance within normal limits. Right Kidney: Length: 10.1 cm. Echogenicity within normal limits. No mass or hydronephrosis visualized. Left Kidney: Length: 10.8 cm. Cyst within inferior pole of the left kidney measures 5.4 x 4.3 x 4.7 cm. Echogenicity within normal limits. No mass or hydronephrosis visualized. Abdominal aorta: No aneurysm visualized.Aortic atherosclerosis. Other findings: Ascites and bilateral pleural effusions. IMPRESSION: 1. Cirrhosis of the liver. Ascites is identified compatible with portal venous hypertension. 2. Gallbladder wall thickening. In the setting of portal venous hypertension and ascites this may be a nonspecific finding. No gallstones or sonographic Murphy's sign noted. 3. Ascites, bilateral pleural effusions. 4.  Aortic Atherosclerosis (ICD10-I70.0). Electronically Signed   By: Signa Kell M.D.   On: 08/28/2019 11:28   Ct Abdomen Pelvis W Contrast  Result Date: 08/27/2019 CLINICAL DATA:  85 year old female with abdominal pain and rectal bleeding. EXAM: CT ABDOMEN AND PELVIS WITH  CONTRAST TECHNIQUE: Multidetector CT imaging of the abdomen and pelvis was performed using the standard protocol following bolus administration of intravenous contrast. CONTRAST:  OMNIPAQUE IOHEXOL 300 MG/ML  SOLN COMPARISON:  None. FINDINGS: Lower chest: Partially visualized small bilateral pleural effusions with associated partial compressive atelectasis of the lung bases. Pneumonia is not excluded. Clinical correlation is recommended. There is diffuse  interstitial and interlobular septal thickening of the visualized lung bases most consistent with edema. There is mild cardiomegaly. A pericardial effusion is partially visualized measuring approximately 1 cm in thickness. No intra-abdominal free air. There is a moderate size ascites. Hepatobiliary: Nodular liver contour in keeping with cirrhosis. No intrahepatic biliary ductal dilatation. The gallbladder is unremarkable. Pancreas: Unremarkable. No pancreatic ductal dilatation or surrounding inflammatory changes. Spleen: Normal in size without focal abnormality. Adrenals/Urinary Tract: The right adrenal gland is unremarkable. The left adrenal gland is not well visualized. There is no hydronephrosis on either side. There is symmetric enhancement and excretion of contrast by both kidneys. There is a 4.5 cm left renal inferior pole cyst. The urinary bladder is grossly unremarkable. Stomach/Bowel: There is sigmoid diverticulosis without active inflammatory changes. There is diffuse thickened appearance of the colon which may be related to ascites and hepatic colopathy. Colitis is not excluded. Clinical correlation is recommended. Similarly there is diffuse thickened appearance of the stomach and small bowel likely related to ascites and liver disease. There is a small hiatal hernia with evidence of gastroesophageal reflux. The appendix is not visualized with certainty. Vascular/Lymphatic: There is advanced aortoiliac atherosclerotic disease. The IVC is unremarkable. No portal venous gas. There is no adenopathy. The SMV, splenic vein, and main portal vein are patent. Reproductive: Hysterectomy. No adnexal masses. Other: Diffuse subcutaneous edema and anasarca. Musculoskeletal: Degenerative changes of the spine. No acute osseous pathology. IMPRESSION: 1. Cirrhosis with evidence of portal hypertension, moderate ascites, and anasarca. 2. Thickened appearance of the colon and small bowel likely related to ascites and liver  disease. Colitis is not excluded. Clinical correlation is recommended. 3. Sigmoid diverticulosis. 4. Cardiomegaly with findings of CHF. Partially visualized small bilateral pleural effusions with associated partial compressive atelectasis of the lung bases. Pneumonia is not excluded. Clinical correlation is recommended. Aortic Atherosclerosis (ICD10-I70.0). Electronically Signed   By: Elgie Collard M.D.   On: 08/27/2019 14:01     Discharge Exam: Vitals:   09/05/19 2153 09/06/19 0519  BP: (!) 150/76 (!) 147/85  Pulse: 98 (!) 103  Resp: 14 14  Temp: 97.8 F (36.6 C) 97.7 F (36.5 C)  SpO2: 90% (!) 89%   Vitals:   09/05/19 0058 09/05/19 1400 09/05/19 2153 09/06/19 0519  BP: 137/64 134/76 (!) 150/76 (!) 147/85  Pulse: 98 95 98 (!) 103  Resp: Temp: 98.2 F (36.8 C) 98 F (36.7 C) 97.8 F (36.6 C) 97.7 F (36.5 C)  TempSrc: Oral Oral Oral Oral  SpO2: 100% 99% 90% (!) 89%  Weight:      Height:        General: Pt is alert, awake, not in acute distress Cardiovascular: RRR, S1/S2 +, no rubs, no gallops Respiratory: CTA bilaterally, no wheezing, no rhonchi Abdominal: Soft, NT, ND, bowel sounds + Extremities: no edema, no cyanosis    The results of significant diagnostics from this hospitalization (including imaging, microbiology, ancillary and laboratory) are listed below for reference.     Microbiology: Recent Results (from the past 240 hour(s))  SARS CORONAVIRUS 2 (TAT 6-24 HRS) Nasopharyngeal Nasopharyngeal Swab  Status: None   Collection Time: 08/27/19  2:14 PM   Specimen: Nasopharyngeal Swab  Result Value Ref Range Status   SARS Coronavirus 2 NEGATIVE NEGATIVE Final    Comment: (NOTE) SARS-CoV-2 target nucleic acids are NOT DETECTED. The SARS-CoV-2 RNA is generally detectable in upper and lower respiratory specimens during the acute phase of infection. Negative results do not preclude SARS-CoV-2 infection, do not rule out co-infections with other  pathogens, and should not be used as the sole basis for treatment or other patient management decisions. Negative results must be combined with clinical observations, patient history, and epidemiological information. The expected result is Negative. Fact Sheet for Patients: HairSlick.no Fact Sheet for Healthcare Providers: quierodirigir.com This test is not yet approved or cleared by the [place] FDA and  has been authorized for detection and/or diagnosis of SARS-CoV-2 by FDA under an Emergency Use Authorization (EUA). This EUA will remain  in effect (meaning this test can be used) for the duration of the COVID-19 declaration under Section 56 4(b)(1) of the Act, 21 U.S.C. section 360bbb-3(b)(1), unless the authorization is terminated or revoked sooner. Performed at [facility], [address] [place], [place] [address]   MRSA PCR Screening     Status: None   Collection Time: 08/27/19 10:58 PM   Specimen: Nasal Mucosa; Nasopharyngeal  Result Value Ref Range Status   MRSA by PCR NEGATIVE NEGATIVE Final    Comment:        The GeneXpert MRSA Assay (FDA approved for NASAL specimens only), is one component of a comprehensive MRSA colonization surveillance program. It is not intended to diagnose MRSA infection nor to guide or monitor treatment for MRSA infections. Performed at [facility], [address], [place], [place] [address]   SARS CORONAVIRUS 2 (TAT 6-24 HRS) Nasopharyngeal Nasopharyngeal Swab     Status: None   Collection Time: 09/04/19  8:21 AM   Specimen: Nasopharyngeal Swab  Result Value Ref Range Status   SARS Coronavirus 2 NEGATIVE NEGATIVE Final    Comment: (NOTE) SARS-CoV-2 target nucleic acids are NOT DETECTED. The SARS-CoV-2 RNA is generally detectable in upper and lower respiratory specimens during the acute phase of infection. Negative results do not preclude SARS-CoV-2 infection, do not rule  out co-infections with other pathogens, and should not be used as the sole basis for treatment or other patient management decisions. Negative results must be combined with clinical observations, patient history, and epidemiological information. The expected result is Negative. Fact Sheet for Patients: HairSlick.no Fact Sheet for Healthcare Providers: quierodirigir.com This test is not yet approved or cleared by the [place] FDA and  has been authorized for detection and/or diagnosis of SARS-CoV-2 by FDA under an Emergency Use Authorization (EUA). This EUA will remain  in effect (meaning this test can be used) for the duration of the COVID-19 declaration under Section 56 4(b)(1) of the Act, 21 U.S.C. section 360bbb-3(b)(1), unless the authorization is terminated or revoked sooner. Performed at [facility], [address], [place], [place] [address]      Labs: BNP (last 3 results) No results for input(s): BNP in the last 8760 hours. Basic Metabolic Panel: Recent Labs  Lab 09/04/19 0837 09/05/19 0315 09/06/19 0512  NA 138 136 136  K 3.4* 3.8 3.6  CL 106 110 106  CO2 22 21* 21*  GLUCOSE 138* 85 93  BUN 11 12 9   CREATININE 0.69 0.57 0.55  CALCIUM 8.2* 8.0* 8.5*  MG  --  1.7  --  Liver Function Tests: No results for input(s): AST, ALT, ALKPHOS, BILITOT, PROT, ALBUMIN in the last 168 hours. No results for input(s): LIPASE, AMYLASE in the last 168 hours. No results for input(s): AMMONIA in the last 168 hours. CBC: Recent Labs  Lab 08/31/19 0743 09/01/19 0654 09/04/19 0837 09/04/19 1405 09/05/19 0315 09/06/19 0512  WBC 5.9 4.7 11.3*  --  7.1 6.8  NEUTROABS  --   --  8.9*  --   --   --   HGB 9.9* 9.5* 7.8* 6.9* 9.4* 10.6*  HCT 32.0* 30.5* 26.0* 23.9* 29.5* 33.5*  MCV 94.4 93.8 95.9  --  92.2 92.3  PLT 172 153 202  --  138* 161   Cardiac Enzymes: No results for input(s): CKTOTAL, CKMB, CKMBINDEX,  TROPONINI in the last 168 hours. BNP: Invalid input(s): POCBNP CBG: No results for input(s): GLUCAP in the last 168 hours. D-Dimer No results for input(s): DDIMER in the last 72 hours. Hgb A1c No results for input(s): HGBA1C in the last 72 hours. Lipid Profile No results for input(s): CHOL, HDL, LDLCALC, TRIG, CHOLHDL, LDLDIRECT in the last 72 hours. Thyroid function studies Recent Labs    09/04/19 0837  TSH 18.390*   Anemia work up No results for input(s): VITAMINB12, FOLATE, FERRITIN, TIBC, IRON, RETICCTPCT in the last 72 hours. Urinalysis No results found for: COLORURINE, APPEARANCEUR, LABSPEC, PHURINE, GLUCOSEU, HGBUR, BILIRUBINUR, KETONESUR, PROTEINUR, UROBILINOGEN, NITRITE, LEUKOCYTESUR Sepsis Labs Invalid input(s): PROCALCITONIN,  WBC,  LACTICIDVEN Microbiology Recent Results (from the past 240 hour(s))  SARS CORONAVIRUS 2 (TAT 6-24 HRS) Nasopharyngeal Nasopharyngeal Swab     Status: None   Collection Time: 08/27/19  2:14 PM   Specimen: Nasopharyngeal Swab  Result Value Ref Range Status   SARS Coronavirus 2 NEGATIVE NEGATIVE Final    Comment: (NOTE) SARS-CoV-2 target nucleic acids are NOT DETECTED. The SARS-CoV-2 RNA is generally detectable in upper and lower respiratory specimens during the acute phase of infection. Negative results do not preclude SARS-CoV-2 infection, do not rule out co-infections with other pathogens, and should not be used as the sole basis for treatment or other patient management decisions. Negative results must be combined with clinical observations, patient history, and epidemiological information. The expected result is Negative. Fact Sheet for Patients: HairSlick.no Fact Sheet for Healthcare Providers: quierodirigir.com This test is not yet approved or cleared by the [place] FDA and  has been authorized for detection and/or diagnosis of SARS-CoV-2 by FDA under an Emergency Use  Authorization (EUA). This EUA will remain  in effect (meaning this test can be used) for the duration of the COVID-19 declaration under Section 56 4(b)(1) of the Act, 21 U.S.C. section 360bbb-3(b)(1), unless the authorization is terminated or revoked sooner. Performed at [facility], [address] [place], [place] [address]   MRSA PCR Screening     Status: None   Collection Time: 08/27/19 10:58 PM   Specimen: Nasal Mucosa; Nasopharyngeal  Result Value Ref Range Status   MRSA by PCR NEGATIVE NEGATIVE Final    Comment:        The GeneXpert MRSA Assay (FDA approved for NASAL specimens only), is one component of a comprehensive MRSA colonization surveillance program. It is not intended to diagnose MRSA infection nor to guide or monitor treatment for MRSA infections. Performed at [facility], [address] [place], [place] [address]   SARS CORONAVIRUS 2 (TAT 6-24 HRS) Nasopharyngeal Nasopharyngeal Swab     Status: None   Collection Time: 09/04/19  8:21 AM  Specimen: Nasopharyngeal Swab  Result Value Ref Range Status   SARS Coronavirus 2 NEGATIVE NEGATIVE Final    Comment: (NOTE) SARS-CoV-2 target nucleic acids are NOT DETECTED. The SARS-CoV-2 RNA is generally detectable in upper and lower respiratory specimens during the acute phase of infection. Negative results do not preclude SARS-CoV-2 infection, do not rule out co-infections with other pathogens, and should not be used as the sole basis for treatment or other patient management decisions. Negative results must be combined with clinical observations, patient history, and epidemiological information. The expected result is Negative. Fact Sheet for Patients: HairSlick.nohttps://www.fda.gov/media/138098/download Fact Sheet for Healthcare Providers: quierodirigir.comhttps://www.fda.gov/media/138095/download This test is not yet approved or cleared by the [place] FDA and  has been authorized for detection and/or diagnosis of SARS-CoV-2  by FDA under an Emergency Use Authorization (EUA). This EUA will remain  in effect (meaning this test can be used) for the duration of the COVID-19 declaration under Section 56 4(b)(1) of the Act, 21 U.S.C. section 360bbb-3(b)(1), unless the authorization is terminated or revoked sooner. Performed at [facility], [address] [place], [place] [id number]      Time coordinating discharge: 35 minutes  SIGNED:   Erick BlinksPratik D Miliani Deike, DO [facility] 09/06/2019, 9:42 AM  If 7PM-7AM, please contact night-coverage [email or web address]

## 2019-09-08 DIAGNOSIS — I959 Hypotension, unspecified: Secondary | ICD-10-CM | POA: Diagnosis not present

## 2019-09-08 DIAGNOSIS — R58 Hemorrhage, not elsewhere classified: Secondary | ICD-10-CM | POA: Diagnosis not present

## 2019-09-08 DIAGNOSIS — R531 Weakness: Secondary | ICD-10-CM | POA: Diagnosis not present

## 2019-09-09 ENCOUNTER — Other Ambulatory Visit: Payer: Self-pay

## 2019-09-09 ENCOUNTER — Observation Stay (HOSPITAL_COMMUNITY)
Admission: EM | Admit: 2019-09-09 | Discharge: 2019-09-10 | Disposition: A | Discharge status: Home / Self Care | Payer: Medicare Other | Attending: Family Medicine | Admitting: Family Medicine

## 2019-09-09 ENCOUNTER — Encounter (HOSPITAL_COMMUNITY): Payer: Self-pay | Admitting: Emergency Medicine

## 2019-09-09 DIAGNOSIS — K5730 Diverticulosis of large intestine without perforation or abscess without bleeding: Secondary | ICD-10-CM | POA: Insufficient documentation

## 2019-09-09 DIAGNOSIS — K766 Portal hypertension: Secondary | ICD-10-CM | POA: Diagnosis not present

## 2019-09-09 DIAGNOSIS — Z881 Allergy status to other antibiotic agents status: Secondary | ICD-10-CM | POA: Insufficient documentation

## 2019-09-09 DIAGNOSIS — Z20828 Contact with and (suspected) exposure to other viral communicable diseases: Secondary | ICD-10-CM | POA: Insufficient documentation

## 2019-09-09 DIAGNOSIS — K7469 Other cirrhosis of liver: Secondary | ICD-10-CM | POA: Insufficient documentation

## 2019-09-09 DIAGNOSIS — K3189 Other diseases of stomach and duodenum: Secondary | ICD-10-CM | POA: Diagnosis not present

## 2019-09-09 DIAGNOSIS — J90 Pleural effusion, not elsewhere classified: Secondary | ICD-10-CM | POA: Diagnosis not present

## 2019-09-09 DIAGNOSIS — I313 Pericardial effusion (noninflammatory): Secondary | ICD-10-CM | POA: Insufficient documentation

## 2019-09-09 DIAGNOSIS — K625 Hemorrhage of anus and rectum: Secondary | ICD-10-CM

## 2019-09-09 DIAGNOSIS — F329 Major depressive disorder, single episode, unspecified: Secondary | ICD-10-CM | POA: Diagnosis not present

## 2019-09-09 DIAGNOSIS — I509 Heart failure, unspecified: Secondary | ICD-10-CM | POA: Insufficient documentation

## 2019-09-09 DIAGNOSIS — I110 Hypertensive heart disease with heart failure: Secondary | ICD-10-CM | POA: Insufficient documentation

## 2019-09-09 DIAGNOSIS — I700 Atherosclerosis of aorta: Secondary | ICD-10-CM | POA: Insufficient documentation

## 2019-09-09 DIAGNOSIS — R55 Syncope and collapse: Secondary | ICD-10-CM | POA: Diagnosis not present

## 2019-09-09 DIAGNOSIS — E876 Hypokalemia: Secondary | ICD-10-CM | POA: Insufficient documentation

## 2019-09-09 DIAGNOSIS — Z03818 Encounter for observation for suspected exposure to other biological agents ruled out: Secondary | ICD-10-CM | POA: Diagnosis not present

## 2019-09-09 DIAGNOSIS — Z8249 Family history of ischemic heart disease and other diseases of the circulatory system: Secondary | ICD-10-CM | POA: Insufficient documentation

## 2019-09-09 DIAGNOSIS — D62 Acute posthemorrhagic anemia: Secondary | ICD-10-CM | POA: Diagnosis not present

## 2019-09-09 DIAGNOSIS — K449 Diaphragmatic hernia without obstruction or gangrene: Secondary | ICD-10-CM | POA: Diagnosis not present

## 2019-09-09 DIAGNOSIS — N281 Cyst of kidney, acquired: Secondary | ICD-10-CM | POA: Diagnosis not present

## 2019-09-09 DIAGNOSIS — M2578 Osteophyte, vertebrae: Secondary | ICD-10-CM | POA: Insufficient documentation

## 2019-09-09 DIAGNOSIS — K529 Noninfective gastroenteritis and colitis, unspecified: Secondary | ICD-10-CM | POA: Insufficient documentation

## 2019-09-09 DIAGNOSIS — Z79899 Other long term (current) drug therapy: Secondary | ICD-10-CM | POA: Diagnosis not present

## 2019-09-09 DIAGNOSIS — Z885 Allergy status to narcotic agent status: Secondary | ICD-10-CM | POA: Insufficient documentation

## 2019-09-09 DIAGNOSIS — M3500 Sicca syndrome, unspecified: Secondary | ICD-10-CM | POA: Diagnosis not present

## 2019-09-09 DIAGNOSIS — E039 Hypothyroidism, unspecified: Secondary | ICD-10-CM | POA: Insufficient documentation

## 2019-09-09 DIAGNOSIS — K219 Gastro-esophageal reflux disease without esophagitis: Secondary | ICD-10-CM | POA: Diagnosis not present

## 2019-09-09 DIAGNOSIS — J439 Emphysema, unspecified: Secondary | ICD-10-CM | POA: Diagnosis not present

## 2019-09-09 DIAGNOSIS — Z90710 Acquired absence of both cervix and uterus: Secondary | ICD-10-CM | POA: Insufficient documentation

## 2019-09-09 DIAGNOSIS — R188 Other ascites: Secondary | ICD-10-CM | POA: Insufficient documentation

## 2019-09-09 DIAGNOSIS — Z882 Allergy status to sulfonamides status: Secondary | ICD-10-CM | POA: Insufficient documentation

## 2019-09-09 DIAGNOSIS — Z888 Allergy status to other drugs, medicaments and biological substances status: Secondary | ICD-10-CM | POA: Insufficient documentation

## 2019-09-09 DIAGNOSIS — Z880 Allergy status to penicillin: Secondary | ICD-10-CM | POA: Insufficient documentation

## 2019-09-09 DIAGNOSIS — K922 Gastrointestinal hemorrhage, unspecified: Secondary | ICD-10-CM | POA: Diagnosis present

## 2019-09-09 HISTORY — DX: Gastro-esophageal reflux disease without esophagitis: K21.9

## 2019-09-09 HISTORY — DX: Sjogren syndrome, unspecified: M35.00

## 2019-09-09 HISTORY — DX: Unspecified cirrhosis of liver: K74.60

## 2019-09-09 LAB — CBC WITH DIFFERENTIAL/PLATELET
Abs Immature Granulocytes: 0.04 10*3/uL (ref 0.00–0.07)
Basophils Absolute: 0 10*3/uL (ref 0.0–0.1)
Basophils Relative: 0 %
Eosinophils Absolute: 0.1 10*3/uL (ref 0.0–0.5)
Eosinophils Relative: 1 %
HCT: 25.4 % — ABNORMAL LOW (ref 36.0–46.0)
Hemoglobin: 7.8 g/dL — ABNORMAL LOW (ref 12.0–15.0)
Immature Granulocytes: 0 %
Lymphocytes Relative: 6 %
Lymphs Abs: 0.6 10*3/uL — ABNORMAL LOW (ref 0.7–4.0)
MCH: 29.7 pg (ref 26.0–34.0)
MCHC: 30.7 g/dL (ref 30.0–36.0)
MCV: 96.6 fL (ref 80.0–100.0)
Monocytes Absolute: 0.8 10*3/uL (ref 0.1–1.0)
Monocytes Relative: 9 %
Neutro Abs: 7.9 10*3/uL — ABNORMAL HIGH (ref 1.7–7.7)
Neutrophils Relative %: 84 %
Platelets: 185 10*3/uL (ref 150–400)
RBC: 2.63 MIL/uL — ABNORMAL LOW (ref 3.87–5.11)
RDW: 17.2 % — ABNORMAL HIGH (ref 11.5–15.5)
WBC: 9.4 10*3/uL (ref 4.0–10.5)
nRBC: 0 % (ref 0.0–0.2)

## 2019-09-09 LAB — CBC
HCT: 26.1 % — ABNORMAL LOW (ref 36.0–46.0)
Hemoglobin: 8.2 g/dL — ABNORMAL LOW (ref 12.0–15.0)
MCH: 29.7 pg (ref 26.0–34.0)
MCHC: 31.4 g/dL (ref 30.0–36.0)
MCV: 94.6 fL (ref 80.0–100.0)
Platelets: 136 10*3/uL — ABNORMAL LOW (ref 150–400)
RBC: 2.76 MIL/uL — ABNORMAL LOW (ref 3.87–5.11)
RDW: 16.8 % — ABNORMAL HIGH (ref 11.5–15.5)
WBC: 4.9 10*3/uL (ref 4.0–10.5)
nRBC: 0 % (ref 0.0–0.2)

## 2019-09-09 LAB — COMPREHENSIVE METABOLIC PANEL
ALT: 18 U/L (ref 0–44)
ALT: 16 U/L (ref 0–44)
AST: 32 U/L (ref 15–41)
AST: 30 U/L (ref 15–41)
Albumin: 2.2 g/dL — ABNORMAL LOW (ref 3.5–5.0)
Albumin: 2.1 g/dL — ABNORMAL LOW (ref 3.5–5.0)
Alkaline Phosphatase: 59 U/L (ref 38–126)
Alkaline Phosphatase: 46 U/L (ref 38–126)
Anion gap: 6 (ref 5–15)
Anion gap: 8 (ref 5–15)
BUN: 10 mg/dL (ref 8–23)
BUN: 9 mg/dL (ref 8–23)
CO2: 23 mmol/L (ref 22–32)
CO2: 24 mmol/L (ref 22–32)
Calcium: 7.8 mg/dL — ABNORMAL LOW (ref 8.9–10.3)
Calcium: 8 mg/dL — ABNORMAL LOW (ref 8.9–10.3)
Chloride: 107 mmol/L (ref 98–111)
Chloride: 105 mmol/L (ref 98–111)
Creatinine, Ser: 0.61 mg/dL (ref 0.44–1.00)
Creatinine, Ser: 0.6 mg/dL (ref 0.44–1.00)
GFR calc Af Amer: >60 mL/min (ref >60)
GFR calc Af Amer: >60 mL/min (ref >60)
GFR calc non Af Amer: >60 mL/min (ref >60)
GFR calc non Af Amer: >60 mL/min (ref >60)
Glucose, Bld: 127 mg/dL — ABNORMAL HIGH (ref 70–99)
Glucose, Bld: 109 mg/dL — ABNORMAL HIGH (ref 70–99)
Potassium: 3.4 mmol/L — ABNORMAL LOW (ref 3.5–5.1)
Potassium: 3.9 mmol/L (ref 3.5–5.1)
Sodium: 136 mmol/L (ref 135–145)
Sodium: 137 mmol/L (ref 135–145)
Total Bilirubin: 0.8 mg/dL (ref 0.3–1.2)
Total Bilirubin: 1 mg/dL (ref 0.3–1.2)
Total Protein: 4.7 g/dL — ABNORMAL LOW (ref 6.5–8.1)
Total Protein: 4.5 g/dL — ABNORMAL LOW (ref 6.5–8.1)

## 2019-09-09 LAB — PREPARE RBC (CROSSMATCH)

## 2019-09-09 LAB — HEMOGLOBIN AND HEMATOCRIT, BLOOD
HCT: 27.9 % — ABNORMAL LOW (ref 36.0–46.0)
Hemoglobin: 8.8 g/dL — ABNORMAL LOW (ref 12.0–15.0)

## 2019-09-09 LAB — SARS CORONAVIRUS 2 (TAT 6-24 HRS): SARS Coronavirus 2: NEGATIVE

## 2019-09-09 LAB — MAGNESIUM: Magnesium: 1.5 mg/dL — ABNORMAL LOW (ref 1.7–2.4)

## 2019-09-09 LAB — PROTIME-INR
INR: 1.3 — ABNORMAL HIGH (ref 0.8–1.2)
Prothrombin Time: 15.8 seconds — ABNORMAL HIGH (ref 11.4–15.2)

## 2019-09-09 MED ORDER — SERTRALINE HCL 50 MG PO TABS
25.0000 mg | ORAL_TABLET | Freq: Every day | ORAL | Status: DC
  Administered 2019-09-09 – 2019-09-10 (×2): 25 mg via ORAL
  Filled 2019-09-09 (×3): qty 1

## 2019-09-09 MED ORDER — THYROID 30 MG PO TABS
15.0000 mg | ORAL_TABLET | Freq: Every day | ORAL | Status: DC
  Filled 2019-09-09 (×3): qty 1

## 2019-09-09 MED ORDER — LOPERAMIDE HCL 2 MG PO CAPS
2.0000 mg | ORAL_CAPSULE | ORAL | Status: DC | PRN
  Administered 2019-09-09: 22:00:00 2 mg via ORAL
  Filled 2019-09-09: qty 1

## 2019-09-09 MED ORDER — BOOST / RESOURCE BREEZE PO LIQD CUSTOM
1.0000 | Freq: Three times a day (TID) | ORAL | Status: DC
  Administered 2019-09-09 – 2019-09-10 (×2): 1 via ORAL

## 2019-09-09 MED ORDER — POTASSIUM CHLORIDE 10 MEQ/100ML IV SOLN
10.0000 meq | Freq: Once | INTRAVENOUS | Status: AC
  Administered 2019-09-09: 06:00:00 10 meq via INTRAVENOUS
  Filled 2019-09-09: qty 100

## 2019-09-09 MED ORDER — SODIUM CHLORIDE 0.9 % IV SOLN
INTRAVENOUS | Status: DC
  Administered 2019-09-09 – 2019-09-10 (×3): via INTRAVENOUS

## 2019-09-09 MED ORDER — ENSURE ENLIVE PO LIQD: 237.0000 mL | Freq: Two times a day (BID) | ORAL | Status: DC

## 2019-09-09 MED ORDER — PANTOPRAZOLE SODIUM 40 MG IV SOLR
40.0000 mg | Freq: Two times a day (BID) | INTRAVENOUS | Status: DC
  Administered 2019-09-09 – 2019-09-10 (×3): 40 mg via INTRAVENOUS
  Filled 2019-09-09 (×4): qty 40

## 2019-09-09 MED ORDER — THYROID 30 MG PO TABS
120.0000 mg | ORAL_TABLET | Freq: Every day | ORAL | Status: DC
  Administered 2019-09-09 – 2019-09-10 (×2): 120 mg via ORAL
  Filled 2019-09-09 (×3): qty 1

## 2019-09-09 MED ORDER — SODIUM CHLORIDE 0.9 % IV SOLN: 10.0000 mL/h | Freq: Once | INTRAVENOUS | Status: DC

## 2019-09-09 MED ORDER — FERROUS SULFATE 325 (65 FE) MG PO TABS
325.0000 mg | ORAL_TABLET | Freq: Every day | ORAL | Status: DC
  Filled 2019-09-09 (×3): qty 1

## 2019-09-09 NOTE — Progress Notes (Signed)
Since being admitted to room has had one blood soaked peripad and blood in toilet.  Assisted to bedside commode just now and after several hours with clean peripad just a small dime size area of blood and clear urine in hat with no bloody rectal discharge.  Stated had a normal bm yesterday that was brown with no blood.

## 2019-09-09 NOTE — ED Notes (Signed)
Pt's O2 sats 88%. Placed O2 on via nasal cannula @ 2l/hr.

## 2019-09-09 NOTE — Consult Note (Signed)
Referring Provider: Dr. Clearence Ped Primary Care Physician:  Sharilyn Sites, MD Primary Gastroenterologist:  Dr. Laural Golden   Date of Admission: 09/09/2019 Date of Consultation: 09/09/2019  Reason for Consultation:  GI bleed  HPI:  [name] is very pleasant 85 y.o. year old female with history of newly diagnosed cirrhosis found at time of first admission in Nov 2020, felt to have cryptogenic cirrhosis or cirrhosis secondary to Sjogren's syndrome, and recurrent lower GI bleed felt secondary to diverticular origin.  This is her third presentation for large volume painless rectal bleeding. Initially inpatient Aug 27, 2019 with acute blood loss anemia due to lower GI bleed, receiving 3 units PRBCs at that time. EGD and colonoscopy completed without evidence for active bleeding. No esophageal varices. Discharged on 11/15 and shortly thereafter readmitted 11/18 with recurrent painless hematochezia, requiring again blood transfusion with 2 units PRBCs. Plans going forward if rebleeding to pursue bleeding scan vs repeat colonoscopy.   Admitting Hgb 7.8, down from 10.6 at discharge several days ago. She received 1 unit PRBCs. Post-transfusion H/H 8.2. States last night she sat on the toilet and had one episode of painless large volume rectal bleeding, stating she could "hear it running down into the water". Daughter was present. Had to call EMS because too weak to go to ED on own with daughter. No NSAIDs. She states she had been taking NSAIDs up till about a week before first presentation in November. No abdominal pain. No further overt GI bleeding. No N/V. No fever/chills. Feels improved now. Weakness improved. States abdomen feels bloated at times when standing. Ascites on US abdomen completed on 08/28/2019. Desires to go to 3rd floor, not step-down.    Past Medical History:  Diagnosis Date  . Cirrhosis ([place])   . GERD (gastroesophageal reflux disease)   . GI bleed   . Hypertension   . Sjogren's  disease ([place])   . Thyroid disease    hypothyroidism    Past Surgical History:  Procedure Laterality Date  . COLONOSCOPY WITH ESOPHAGOGASTRODUODENOSCOPY (EGD) AND ESOPHAGEAL DILATION (ED)     years ago  . COLONOSCOPY WITH PROPOFOL N/A 08/30/2019   multiple diverticula to sigmoid without evidence of active bleeding.Two non-bleeding colonic angiodysplastic lesions.   . complete hysterectomy    . ESOPHAGOGASTRODUODENOSCOPY (EGD) WITH PROPOFOL N/A 08/30/2019    short segment Barrett's esophagus, hiatal hernia, portal gastropathy.  . left rotator cuff      Prior to Admission medications   Medication Sig Start Date End Date Taking? Authorizing Provider  Calcium-Magnesium-Zinc [phone] MG TABS Take by mouth.    [provider]  Cholecalciferol (VITAMIN D3) 10 MCG (400 UNIT) CAPS Take 1,000 mg by mouth daily.     [provider]  co-enzyme Q-10 30 MG capsule Take 30 mg by mouth daily.     [provider]  esomeprazole (NEXIUM) 20 MG capsule Take 1 capsule (20 mg total) by mouth at bedtime. 09/01/19   Kathie Dike, MD  Ferrous Sulfate (IRON) 325 (65 Fe) MG TABS Take 1 tablet (325 mg total) by mouth daily at 2 PM. 09/01/19   Kathie Dike, MD  loperamide (IMODIUM) 2 MG capsule Take 2 mg by mouth as needed for diarrhea or loose stools.    [provider]  nystatin (MYCOSTATIN) 100000 UNIT/ML suspension Take 5 mLs (500,000 Units total) by mouth 4 (four) times daily for 7 days. 09/06/19 09/13/19  Manuella Ghazi, Pratik D, DO  Omega-3 Fatty Acids (FISH OIL PO) Take 1 capsule by mouth daily.  [provider]  sertraline (ZOLOFT) 25 MG tablet Take 25 mg by mouth daily.    [provider]  thyroid (ARMOUR) 120 MG tablet Take 120 mg by mouth daily before breakfast. Take 1 tablet by mouth each morning upon awakening. Do not eat until 1 hour after taking medication.    [provider]  Turmeric 400 MG CAPS Take 400 mg by mouth daily.     [provider]  Wheat Dextrin (BENEFIBER DRINK MIX PO) Take 1 packet by mouth daily. With breakfast    [provider]    Current Facility-Administered Medications  Medication Dose Route Frequency Provider Last Rate Last Dose  . 0.9 %  sodium chloride infusion   Intravenous Continuous Zierle-Ghosh, Asia B, DO 50 mL/hr at 09/09/19 0627    . pantoprazole (PROTONIX) injection 40 mg  40 mg Intravenous Q12H Zierle-Ghosh, Asia B, DO      . sertraline (ZOLOFT) tablet 25 mg  25 mg Oral Daily Zierle-Ghosh, Asia B, DO      . thyroid (ARMOUR) tablet 120 mg  120 mg Oral QAC breakfast Zierle-Ghosh, Asia B, DO       Current Outpatient Medications  Medication Sig Dispense Refill  . Calcium-Magnesium-Zinc [phone] MG TABS Take by mouth.    . Cholecalciferol (VITAMIN D3) 10 MCG (400 UNIT) CAPS Take 1,000 mg by mouth daily.     Marland Kitchen co-enzyme Q-10 30 MG capsule Take 30 mg by mouth daily.     Marland Kitchen esomeprazole (NEXIUM) 20 MG capsule Take 1 capsule (20 mg total) by mouth at bedtime.    . Ferrous Sulfate (IRON) 325 (65 Fe) MG TABS Take 1 tablet (325 mg total) by mouth daily at 2 PM. 30 tablet 0  . loperamide (IMODIUM) 2 MG capsule Take 2 mg by mouth as needed for diarrhea or loose stools.    . nystatin (MYCOSTATIN) 100000 UNIT/ML suspension Take 5 mLs (500,000 Units total) by mouth 4 (four) times daily for 7 days. 60 mL 0  . Omega-3 Fatty Acids (FISH OIL PO) Take 1 capsule by mouth daily.    . sertraline (ZOLOFT) 25 MG tablet Take 25 mg by mouth daily.    Marland Kitchen thyroid (ARMOUR) 120 MG tablet Take 120 mg by mouth daily before breakfast. Take 1 tablet by mouth each morning upon awakening. Do not eat until 1 hour after taking medication.    . Turmeric 400 MG CAPS Take 400 mg by mouth daily.     . Wheat Dextrin (BENEFIBER DRINK MIX PO) Take 1 packet by mouth daily. With breakfast      Allergies as of 09/09/2019 - Review Complete 09/09/2019  Allergen Reaction Noted  . Ciprofloxacin Other (See Comments) 06/02/2011   . Ciprocin-fluocin-procin [fluocinolone acetonide]  03/22/2011  . Codeine  03/22/2011  . Demerol  03/22/2011  . Penicillins  03/22/2011  . Sulfa antibiotics  03/22/2011    Family History  Problem Relation Age of Onset  . Colon cancer Neg Hx   . Colon polyps Neg Hx     Social History   Socioeconomic History  . Marital status: Widowed    Spouse name: Not on file  . Number of children: Not on file  . Years of education: Not on file  . Highest education level: Not on file  Occupational History  . Not on file  Social Needs  . Financial resource strain: Not on file  . Food insecurity    Worry: Not on file    Inability:  Not on file  . Transportation needs    Medical: Not on file    Non-medical: Not on file  Tobacco Use  . Smoking status: Never Smoker  . Smokeless tobacco: Never Used  Substance and Sexual Activity  . Alcohol use: Never    Frequency: Never  . Drug use: Never  . Sexual activity: Not on file  Lifestyle  . Physical activity    Days per week: Not on file    Minutes per session: Not on file  . Stress: Not on file  Relationships  . Social Musician on phone: Not on file    Gets together: Not on file    Attends religious service: Not on file    Active member of club or organization: Not on file    Attends meetings of clubs or organizations: Not on file    Relationship status: Not on file  . Intimate partner violence    Fear of current or ex partner: Not on file    Emotionally abused: Not on file    Physically abused: Not on file    Forced sexual activity: Not on file  Other Topics Concern  . Not on file  Social History Narrative  . Not on file    Review of Systems: Gen: see HPI CV: Denies chest pain, heart palpitations, syncope, edema  Resp: Denies shortness of breath with rest, cough, wheezing GI: see HPI GU : Denies urinary burning, urinary frequency, urinary incontinence.  MS: Denies joint pain,swelling, cramping Derm: Denies  rash, itching, dry skin Psych: Denies depression, anxiety,confusion, or memory loss Heme: see HPI  Physical Exam: Vital signs in last 24 hours: Temp:  [97.7 F (36.5 C)-98.2 F (36.8 C)] 98.2 F (36.8 C) (11/23 0600) Pulse Rate:  [82-95] 91 (11/23 0600) Resp:  [18-27] 22 (11/23 0600) BP: (103-128)/(43-60) 128/60 (11/23 0600) SpO2:  [88 %-100 %] 100 % (11/23 0600) Weight:  [46.7 kg] 46.7 kg (11/23 0009)   General:   Alert, frail-appearing but no distress. Pleasant and cooperative.  Head:  Normocephalic and atraumatic. Eyes:  Sclera clear, no icterus.   Conjunctiva pink. Lungs:  Clear throughout to auscultation.    Heart:  S1 S2 present without murmurs  Abdomen:  Full but soft and nondistended. No masses, hepatosplenomegaly or hernias noted. Normal bowel sounds, without guarding, and without rebound. No evidence for tense ascites.   Rectal:  Deferred  Msk:  Symmetrical without gross deformities. Normal posture. Extremities:  With trace pre-tibial edema Neurologic:  Alert and  oriented x4;  grossly normal neurologically. Skin:  Intact without significant lesions or rashes. Psych:  Alert and cooperative. Normal mood and affect.  Intake/Output from previous day: 11/22 0701 - 11/23 0700 In: 645.9 [I.V.:3.1; Blood:640; IV Piggyback:2.8] Out: -  Intake/Output this shift: No intake/output data recorded.  Lab Results: Recent Labs    09/09/19 0110 09/09/19 0702  WBC 9.4 4.9  HGB 7.8* 8.2*  HCT 25.4* 26.1*  PLT 185 136*   BMET Recent Labs    09/09/19 0110 09/09/19 0702  NA 136 137  K 3.4* 3.9  CL 107 105  CO2 23 24  GLUCOSE 127* 109*  BUN 10 9  CREATININE 0.61 0.60  CALCIUM 7.8* 8.0*   LFT Recent Labs    09/09/19 0110 09/09/19 0702  PROT 4.7* 4.5*  ALBUMIN 2.2* 2.1*  AST 32 30  ALT 18 16  ALKPHOS 59 46  BILITOT 0.8 1.0   PT/INR Recent Labs  09/09/19 0702  LABPROT 15.8*  INR 1.3*    Impression: 85 year old female with cirrhosis (incidentally noted  several weeks ago at time of first presentation to hospital), now with 3rd presentation for acute large volume painless hematochezia and acute blood loss anemia. Prior evaluation by Dr. Karilyn Cotaehman while inpatient with EGD and colonoscopy: no evidence for varices, +portal gastropathy, lower GI with diverticula and 2 angiodysplastic lesions but no stigmata of bleeding. Lower GI bleed felt likely due to diverticular origin. At time of consultation, she has had no further overt GI bleeding since last night prior to ED presentation. Per last plan outlined by Dr. Karilyn Cotaehman, would need to pursue bleeding vs possible repeat colonoscopy. As she is not actively bleeding right now, recommend observation approach. Likely dealing with diverticular origin and unable to exclude occult small bowel lesion as well. Continues to avoid NSAIDs. Hgb has improved after 1 unit PRBCs.  Cirrhosis: newly diagnosed several weeks ago. Felt to be cryptogenic vs due to Sjogren's. No varices on recent EGD. Overall stable at this point. Patient does feel abdominal bloating, and ascites was noted on US several weeks ago. However, she is non-tense on exam. No need for paracentesis unless tense ascites.   Plan: Continue PPI BID Follow H/H Monitor for recurrent lower GI bleeding: per last algorithm would need stat bleeding scan if rebleeds Continue to avoid NSAIDs Clear liquids for now Cirrhosis care as outpatient with Dr. Karilyn Cotaehman Supportive care   Gelene MinkAnna W. Ido Wollman, PhD, ANP-BC [facility]     LOS: 0 days    09/09/2019, 9:24 AM

## 2019-09-09 NOTE — H&P (Addendum)
TRH H&P    Patient Demographics:    [name], is a 85 y.o. female  MRN: [id number]  DOB - 1932/12/06  Admit Date - 09/09/2019  Referring MD/NP/PA: Dr. Waverly Ferrari  Outpatient Primary MD for the patient is Sharilyn Sites, MD  Patient coming from: Home  Chief complaint- Blood per rectum   HPI:   [name] a 85 y.o.femalewith medical history significant forhypothyroidism, Sjogren's syndrome, liver cirrhosis with portal hypertension, depression, chronic diarrhea, and recent discharge on 11/15 for acute blood loss anemia due to diverticular GI bleed who presented back to the ED with bright red blood in her toilet bowl. She was recently transfused 3 units of PRBCs and was discharged with a hemoglobin of 9.5 and then returned to ED on  11/18 with hemoglobin 7.8. She was transfused 2 units with appropriate improvement of Hgb. She then, again returns to the ED 11/23 with bright red blood per rectum and a Hgb of 7.8.   Patient reports that today she was actually feeling stronger and better than she had in the past week.  She reports she was up and around and attending to her own ADLs.  She suddenly felt like she had to go to the bathroom and that it was urgent.  When she got into the bathroom she noticed blood on her panty liner, and then she had bright red blood per rectum dripping into the toilet at the rate of a leaky faucet.  After that she felt like she had to have a bowel movement, and that filled the toilet with blood.  It has been just this 1 episode.  Prior to her previous 2 hospital stays it was only 1-2 episodes as well.  At the end of her bowel movement she was pale, felt weak, and short of breath.  Family called Dr. Laural Golden who advised her to come back to the hospital.  In the ED Patient is hypokalemic at 3.4.  Hypoalbuminemia at 2.2.  Acute blood loss anemia the hemoglobin is 7.8, down from 10.6 at  last discharge.  Patient was typed screen and crossmatch and 1 unit of blood was ordered.   Review of systems:    In addition to the HPI above,  No Fever-chills, No Headache, No changes with Vision or hearing, No problems swallowing food or Liquids, No Chest pain, Cough or Shortness of Breath, No Abdominal pain, No Nausea or Vomiting, patient endorses hematochezia, anorexia and bloating  no Blood in  Urine, No dysuria, No new skin rashes or bruises, No new joints pains-aches,  No tingling, numbness in any extremity, Endorses weakness No recent weight gain or loss, No polyuria, polydypsia or polyphagia, No significant Mental Stressors.  All other systems reviewed and are negative.    Past History of the following :    Past Medical History:  Diagnosis Date   GI bleed    Hypertension    Thyroid disease    hypothyroidism      Past Surgical History:  Procedure Laterality Date   COLONOSCOPY WITH ESOPHAGOGASTRODUODENOSCOPY (EGD) AND  ESOPHAGEAL DILATION (ED)     years ago   COLONOSCOPY WITH PROPOFOL N/A 08/30/2019   Procedure: COLONOSCOPY WITH PROPOFOL;  Surgeon: Malissa Hippo, MD;  [facility];  Service: Endoscopy;  Laterality: N/A;   complete hysterectomy     ESOPHAGOGASTRODUODENOSCOPY (EGD) WITH PROPOFOL N/A 08/30/2019   Procedure: ESOPHAGOGASTRODUODENOSCOPY (EGD) WITH PROPOFOL;  Surgeon: Malissa Hippo, MD;  Location: [facility];  Service: Endoscopy;  Laterality: N/A;   left rotator cuff        Social History:      Social History   Tobacco Use   Smoking status: Never Smoker   Smokeless tobacco: Never Used  Substance Use Topics   Alcohol use: Never    Frequency: Never       Family History :    No family history on file. Family history significant for hypertension   Home Medications:   Prior to Admission medications   Medication Sig Start Date End Date Taking? Authorizing Provider  Calcium-Magnesium-Zinc [phone] MG TABS  Take by mouth.    [provider]  Cholecalciferol (VITAMIN D3) 10 MCG (400 UNIT) CAPS Take 1,000 mg by mouth daily.     [provider]  co-enzyme Q-10 30 MG capsule Take 30 mg by mouth daily.     [provider]  esomeprazole (NEXIUM) 20 MG capsule Take 1 capsule (20 mg total) by mouth at bedtime. 09/01/19   Erick Blinks, MD  Ferrous Sulfate (IRON) 325 (65 Fe) MG TABS Take 1 tablet (325 mg total) by mouth daily at 2 PM. 09/01/19   Erick Blinks, MD  loperamide (IMODIUM) 2 MG capsule Take 2 mg by mouth as needed for diarrhea or loose stools.    [provider]  nystatin (MYCOSTATIN) 100000 UNIT/ML suspension Take 5 mLs (500,000 Units total) by mouth 4 (four) times daily for 7 days. 09/06/19 09/13/19  Sherryll Burger, Pratik D, DO  Omega-3 Fatty Acids (FISH OIL PO) Take 1 capsule by mouth daily.    [provider]  sertraline (ZOLOFT) 25 MG tablet Take 25 mg by mouth daily.    [provider]  thyroid (ARMOUR) 120 MG tablet Take 120 mg by mouth daily before breakfast. Take 1 tablet by mouth each morning upon awakening. Do not eat until 1 hour after taking medication.    [provider]  Turmeric 400 MG CAPS Take 400 mg by mouth daily.     [provider]  Wheat Dextrin (BENEFIBER DRINK MIX PO) Take 1 packet by mouth daily. With breakfast    [provider]     Allergies:     Allergies  Allergen Reactions   Ciprofloxacin Other (See Comments)    NO REACTION LISTED   Ciprocin-Fluocin-Procin [Fluocinolone Acetonide]    Codeine    Demerol    Penicillins     Did it involve swelling of the face/tongue/throat, SOB, or low BP? Unknown Did it involve sudden or severe rash/hives, skin peeling, or any reaction on the inside of your mouth or nose? Unknown Did you need to seek medical attention at a hospital or doctor's office? Yes When did it last happen?Uknown If all above answers are "NO", may proceed with  cephalosporin use. No issue with ceftriaxone   Sulfa Antibiotics      Physical Exam:   Vitals  Blood pressure (!) 112/45, pulse 92, temperature 97.7 F (36.5 C), temperature source Oral, resp. rate (!) 25, height  (1.549 m), weight 46.7 kg, SpO2 95 %.  1.  General: Supine in bed in no acute distress  2. Psychiatric: Mood is appropriate for situation  3. Neurologic: Cranial nerves II through XII are grossly intact no focal deficits on limited exam  4. HEENMT:  Head is atraumatic normocephalic pupils are reactive to light left ear has early pressure ulcer developing  5. Respiratory : Lungs are clear to auscultation bilaterally  6. Cardiovascular : Heart rate is tachycardic and rhythm is regular  7. Gastrointestinal:  Abdomen is soft, distended, nontender to palpation, without masses or guarding  8. Skin:  No acute lesions on limited skin exam  9.Musculoskeletal:  No peripheral edema    Data Review:    CBC Recent Labs  Lab 09/04/19 0837 09/04/19 1405 09/05/19 0315 09/06/19 0512 09/09/19 0110  WBC 11.3*  --  7.1 6.8 9.4  HGB 7.8* 6.9* 9.4* 10.6* 7.8*  HCT 26.0* 23.9* 29.5* 33.5* 25.4*  PLT 202  --  138* 161 185  MCV 95.9  --  92.2 92.3 96.6  MCH 28.8  --  29.4 29.2 29.7  MCHC 30.0  --  31.9 31.6 30.7  RDW 18.2*  --  16.1* 16.6* 17.2*  LYMPHSABS 0.9  --   --   --  0.6*  MONOABS 1.1*  --   --   --  0.8  EOSABS 0.3  --   --   --  0.1  BASOSABS 0.1  --   --   --  0.0   ------------------------------------------------------------------------------------------------------------------  Results for orders placed or performed during the hospital encounter of 09/09/19 (from the past 48 hour(s))  CBC with Differential     Status: Abnormal   Collection Time: 09/09/19  1:10 AM  Result Value Ref Range   WBC 9.4 4.0 - 10.5 K/uL   RBC 2.63 (L) 3.87 - 5.11 MIL/uL   Hemoglobin 7.8 (L) 12.0 - 15.0 g/dL   HCT 78.2 (L) 95.6 - 21.3 %   MCV 96.6 80.0 - 100.0 fL    MCH 29.7 26.0 - 34.0 pg   MCHC 30.7 30.0 - 36.0 g/dL   RDW 08.6 (H) 57.8 - 46.9 %   Platelets 185 150 - 400 K/uL   nRBC 0.0 0.0 - 0.2 %   Neutrophils Relative % 84 %   Neutro Abs 7.9 (H) 1.7 - 7.7 K/uL   Lymphocytes Relative 6 %   Lymphs Abs 0.6 (L) 0.7 - 4.0 K/uL   Monocytes Relative 9 %   Monocytes Absolute 0.8 0.1 - 1.0 K/uL   Eosinophils Relative 1 %   Eosinophils Absolute 0.1 0.0 - 0.5 K/uL   Basophils Relative 0 %   Basophils Absolute 0.0 0.0 - 0.1 K/uL   Immature Granulocytes 0 %   Abs Immature Granulocytes 0.04 0.00 - 0.07 K/uL    Comment: Performed at [facility], [address] [place], [place] [address]  Comprehensive metabolic panel     Status: Abnormal   Collection Time: 09/09/19  1:10 AM  Result Value Ref Range   Sodium 136 135 - 145 mmol/L   Potassium 3.4 (L) 3.5 - 5.1 mmol/L   Chloride 107 98 - 111 mmol/L   CO2 23 22 - 32 mmol/L   Glucose, Bld 127 (H) 70 - 99 mg/dL   BUN 10 8 - 23 mg/dL   Creatinine, Ser 8.41 0.44 - 1.00 mg/dL   Calcium 7.8 (L) 8.9 - 10.3 mg/dL   Total Protein 4.7 (L) 6.5 - 8.1 g/dL   Albumin 2.2 (L) 3.5 - 5.0  g/dL   AST 32 15 - 41 U/L   ALT 18 0 - 44 U/L   Alkaline Phosphatase 59 38 - 126 U/L   Total Bilirubin 0.8 0.3 - 1.2 mg/dL   GFR calc non Af Amer >60 >60 mL/min   GFR calc Af Amer >60 >60 mL/min   Anion gap 6 5 - 15    Comment: Performed at [facility], [address] [place], [place] [address]  Type and screen [facility]     Status: None   Collection Time: 09/09/19  1:11 AM  Result Value Ref Range   ABO/RH(D) A POS    Antibody Screen NEG    Sample Expiration      09/12/2019,2359 Performed at [facility], [address] [place], [place] [address]     Chemistries  Recent Labs  Lab 09/04/19 [phone] 09/05/19 0315 09/06/19 0512 09/09/19 0110  NA 138 136 136 136  K 3.4* 3.8 3.6 3.4*  CL 106 110 106 107  CO2 22 21* 21* 23  GLUCOSE 138* 85 93 127*  BUN 11 12 9 10   CREATININE 0.69 0.57 0.55 0.61  CALCIUM 8.2* 8.0*  8.5* 7.8*  MG  --  1.7  --   --   AST  --   --   --  32  ALT  --   --   --  18  ALKPHOS  --   --   --  59  BILITOT  --   --   --  0.8   ------------------------------------------------------------------------------------------------------------------  ------------------------------------------------------------------------------------------------------------------ GFR: Estimated Creatinine Clearance: 38.6 mL/min (by C-G formula based on SCr of 0.61 mg/dL). Liver Function Tests: Recent Labs  Lab 09/09/19 0110  AST 32  ALT 18  ALKPHOS 59  BILITOT 0.8  PROT 4.7*  ALBUMIN 2.2*   No results for input(s): LIPASE, AMYLASE in the last 168 hours. No results for input(s): AMMONIA in the last 168 hours. Coagulation Profile: No results for input(s): INR, PROTIME in the last 168 hours. Cardiac Enzymes: No results for input(s): CKTOTAL, CKMB, CKMBINDEX, TROPONINI in the last 168 hours. BNP (last 3 results) No results for input(s): PROBNP in the last 8760 hours. HbA1C: No results for input(s): HGBA1C in the last 72 hours. CBG: No results for input(s): GLUCAP in the last 168 hours. Lipid Profile: No results for input(s): CHOL, HDL, LDLCALC, TRIG, CHOLHDL, LDLDIRECT in the last 72 hours. Thyroid Function Tests: No results for input(s): TSH, T4TOTAL, FREET4, T3FREE, THYROIDAB in the last 72 hours. Anemia Panel: No results for input(s): VITAMINB12, FOLATE, FERRITIN, TIBC, IRON, RETICCTPCT in the last 72 hours.  --------------------------------------------------------------------------------------------------------------- Urine analysis: No results found for: COLORURINE, APPEARANCEUR, LABSPEC, PHURINE, GLUCOSEU, HGBUR, BILIRUBINUR, KETONESUR, PROTEINUR, UROBILINOGEN, NITRITE, LEUKOCYTESUR    Imaging Results:    No results found.  Last EKG was November 10 and shows a sinus rhythm 75 with a QTC of 479   Assessment & Plan:    Active Problems:   * No active hospital problems.  *   1. Acute blood loss anemia 1. Secondary to GI bleed 2. Type, screen, crossmatch, transfuse 1 unit 3. Current hemoglobin 7.8 down from 10.62 days ago 2. GI bleed 1. Patient recently had endoscopy with Dr. Karilyn Cotaehman 2. GIB is again recurring 3. Consult GI 4. Serial H&H 5. IV Protonix 6. Continue to monitor 3. Chronic diarrhea 1. Longstanding problem thought to be secondary to Sjogren's 2. Patient uses Imodium as needed 3. Infectious etiology not likely 4. Continue to monitor 4.  Hypothyroidism 1. TSH was 18.390 2. It does not appear that thyroid medication was adjusted 3. Increase thyroid medication by 15 mcg 5.  liver cirrhosis 1. Identified on ultrasound on 11/11 2. Ultrasound shows cirrhosis of liver.  Ascites is identified compatible with portal venous hypertension.  Gallbladder wall thickening. 3. Propanolol was started at the last admission 6. Bloating 1. Likely secondary to ascites 2. Patient reports it makes it difficult for her to lay on her back 3. Consider ultrasound-guided paracentesis    DVT Prophylaxis-    SCDs   AM Labs Ordered, also please review Full Orders  Family Communication: Admission, patients condition and plan of care including tests being ordered have been discussed with the patient and Daughter, [name] who indicate understanding and agree with the plan and Code Status. [name] is DPOA. She would like to be updated as much as possible during this hospital stay. She would like for the patient to NOT BE PLACED IN PROGRESSIVE CARE, as they have had a bad experience there before.    Code Status:  FULL  Admission status: Inpatient: Based on patients clinical presentation and evaluation of above clinical data, I have made determination that patient meets Inpatient criteria at this time.  Time spent in minutes : 75   Lilyan Gilford M.D on 09/09/2019 at 3:43 AM

## 2019-09-09 NOTE — ED Provider Notes (Addendum)
[facility] Provider Note   CSN: [id number] Arrival date & time: 09/09/19  0015     History   Chief Complaint Chief Complaint  Patient presents with  . GI Bleeding    HPI [name] is a 85 y.o. female.     Patient presents to the emergency department for GI bleeding.  Patient reports a recent hospitalization for GI bleeding.  Since going home she had been doing well until tonight.  She felt like she had to have a bowel movement, but then noted that there was a large amount of bright red blood in the toilet without stool.  Patient denies any active abdominal pain.  After the bowel movement she felt weak, dizzy but did not pass out.  No associated chest pain.     Past Medical History:  Diagnosis Date  . GI bleed   . Hypertension   . Thyroid disease    hypothyroidism    Patient Active Problem List   Diagnosis Date Noted  . Acute blood loss anemia 09/01/2019  . Lower GI bleed 09/01/2019  . Syncope 09/01/2019  . Hypothyroidism 09/01/2019  . Hypokalemia 09/01/2019  . Chronic diarrhea 09/01/2019  . Symptomatic anemia 08/27/2019  . Portal hypertension --- liver cirrhosis 08/27/2019  . Cirrhosis of liver with ascites and portal hypertension 08/27/2019  . Diarrhea 04/11/2019    Past Surgical History:  Procedure Laterality Date  . COLONOSCOPY WITH ESOPHAGOGASTRODUODENOSCOPY (EGD) AND ESOPHAGEAL DILATION (ED)     years ago  . COLONOSCOPY WITH PROPOFOL N/A 08/30/2019   Procedure: COLONOSCOPY WITH PROPOFOL;  Surgeon: Malissa Hippoehman, Najeeb U, MD;  [facility];  Service: Endoscopy;  Laterality: N/A;  . complete hysterectomy    . ESOPHAGOGASTRODUODENOSCOPY (EGD) WITH PROPOFOL N/A 08/30/2019   Procedure: ESOPHAGOGASTRODUODENOSCOPY (EGD) WITH PROPOFOL;  Surgeon: Malissa Hippoehman, Najeeb U, MD;  [facility];  Service: Endoscopy;  Laterality: N/A;  . left rotator cuff       OB History   No obstetric history on file.      Home Medications     Prior to Admission medications   Medication Sig Start Date End Date Taking? Authorizing Provider  Calcium-Magnesium-Zinc [phone] MG TABS Take by mouth.    [provider]  Cholecalciferol (VITAMIN D3) 10 MCG (400 UNIT) CAPS Take 1,000 mg by mouth daily.     [provider]  co-enzyme Q-10 30 MG capsule Take 30 mg by mouth daily.     [provider]  esomeprazole (NEXIUM) 20 MG capsule Take 1 capsule (20 mg total) by mouth at bedtime. 09/01/19   Erick BlinksMemon, Jehanzeb, MD  Ferrous Sulfate (IRON) 325 (65 Fe) MG TABS Take 1 tablet (325 mg total) by mouth daily at 2 PM. 09/01/19   Erick BlinksMemon, Jehanzeb, MD  loperamide (IMODIUM) 2 MG capsule Take 2 mg by mouth as needed for diarrhea or loose stools.    [provider]  nystatin (MYCOSTATIN) 100000 UNIT/ML suspension Take 5 mLs (500,000 Units total) by mouth 4 (four) times daily for 7 days. 09/06/19 09/13/19  Sherryll BurgerShah, Pratik D, DO  Omega-3 Fatty Acids (FISH OIL PO) Take 1 capsule by mouth daily.    [provider]  sertraline (ZOLOFT) 25 MG tablet Take 25 mg by mouth daily.    [provider]  thyroid (ARMOUR) 120 MG tablet Take 120 mg by mouth daily before breakfast. Take 1 tablet by mouth each morning upon awakening. Do not eat until 1 hour after taking medication.  [provider]  Turmeric 400 MG CAPS Take 400 mg by mouth daily.     [provider]  Wheat Dextrin (BENEFIBER DRINK MIX PO) Take 1 packet by mouth daily. With breakfast    [provider]    Family History No family history on file.  Social History Social History   Tobacco Use  . Smoking status: Never Smoker  . Smokeless tobacco: Never Used  Substance Use Topics  . Alcohol use: Never    Frequency: Never  . Drug use: Never     Allergies   Ciprofloxacin, Ciprocin-fluocin-procin [fluocinolone acetonide], Codeine, Demerol, Penicillins, and Sulfa antibiotics   Review of Systems Review of Systems   Gastrointestinal: Positive for blood in stool.  All other systems reviewed and are negative.    Physical Exam Updated Vital Signs BP (!) 112/45   Pulse 92   Temp 97.7 F (36.5 C) (Oral)   Resp (!) 25   Ht 5\' 1"  (1.549 m)   Wt 46.7 kg   SpO2 95%   BMI 19.46 kg/m   Physical Exam Vitals signs and nursing note reviewed.  Constitutional:      General: She is not in acute distress.    Appearance: Normal appearance. She is well-developed.  HENT:     Head: Normocephalic and atraumatic.     Right Ear: Hearing normal.     Left Ear: Hearing normal.     Nose: Nose normal.  Eyes:     Conjunctiva/sclera: Conjunctivae normal.     Pupils: Pupils are equal, round, and reactive to light.  Neck:     Musculoskeletal: Normal range of motion and neck supple.  Cardiovascular:     Rate and Rhythm: Regular rhythm.     Heart sounds: S1 normal and S2 normal. No murmur. No friction rub. No gallop.   Pulmonary:     Effort: Pulmonary effort is normal. No respiratory distress.     Breath sounds: Normal breath sounds.  Chest:     Chest wall: No tenderness.  Abdominal:     General: Bowel sounds are normal.     Palpations: Abdomen is soft.     Tenderness: There is no abdominal tenderness. There is no guarding or rebound. Negative signs include Murphy's sign and McBurney's sign.     Hernia: No hernia is present.  Musculoskeletal: Normal range of motion.  Skin:    General: Skin is warm and dry.     Coloration: Skin is pale.     Findings: No rash.  Neurological:     Mental Status: She is alert and oriented to person, place, and time.     GCS: GCS eye subscore is 4. GCS verbal subscore is 5. GCS motor subscore is 6.     Cranial Nerves: No cranial nerve deficit.     Sensory: No sensory deficit.     Coordination: Coordination normal.  Psychiatric:        Speech: Speech normal.        Behavior: Behavior normal.        Thought Content: Thought content normal.      ED Treatments / Results   Labs (all labs ordered are listed, but only abnormal results are displayed) Labs Reviewed  CBC WITH DIFFERENTIAL/PLATELET - Abnormal; Notable for the following components:      Result Value   RBC 2.63 (*)    Hemoglobin 7.8 (*)    HCT 25.4 (*)    RDW 17.2 (*)    Neutro Abs 7.9 (*)  Lymphs Abs 0.6 (*)    All other components within normal limits  COMPREHENSIVE METABOLIC PANEL - Abnormal; Notable for the following components:   Potassium 3.4 (*)    Glucose, Bld 127 (*)    Calcium 7.8 (*)    Total Protein 4.7 (*)    Albumin 2.2 (*)    All other components within normal limits  SARS CORONAVIRUS 2 (TAT 6-24 HRS)  TYPE AND SCREEN  PREPARE RBC (CROSSMATCH)    EKG None  Radiology No results found.  Procedures Procedures (including critical care time)  Medications Ordered in ED Medications  0.9 %  sodium chloride infusion (has no administration in time range)     Initial Impression / Assessment and Plan / ED Course  I have reviewed the triage vital signs and the nursing notes.  Pertinent labs & imaging results that were available during my care of the patient were reviewed by me and considered in my medical decision making (see chart for details).        Patient presents for recurrent rectal bleeding.  Records from recent hospitalization were reviewed.  EGD did not show any active disease.  Colonoscopy showed 2 areas of angiodysplasia as well as multiple diverticula, unclear where the bleeding was coming from because there was no active bleeding at time of procedure.  Patient received transfusion for hemoglobin of 7.3, hemoglobin was 9.4 at discharge.  Hemoglobin now back down to 7.8 and patient feeling weak and dizzy but not hypotensive.    No further bleeding here in the ER, but with her significant drop in hemoglobin and recent requirement for transfusion, will readmit. Will transfuse 1 unit.  CRITICAL CARE Performed by: Gilda Crease   Total critical care  time: 35 minutes  Critical care time was exclusive of separately billable procedures and treating other patients.  Critical care was necessary to treat or prevent imminent or life-threatening deterioration.  Critical care was time spent personally by me on the following activities: development of treatment plan with patient and/or surrogate as well as nursing, discussions with consultants, evaluation of patient's response to treatment, examination of patient, obtaining history from patient or surrogate, ordering and performing treatments and interventions, ordering and review of laboratory studies, ordering and review of radiographic studies, pulse oximetry and re-evaluation of patient's condition.   Final Clinical Impressions(s) / ED Diagnoses   Final diagnoses:  Lower GI bleed    ED Discharge Orders    None       Pollina, Canary Brim, MD 09/09/19 0302    Gilda Crease, MD 09/09/19 [phone]

## 2019-09-09 NOTE — ED Triage Notes (Signed)
Pt recently discharged with lower GI bleed. Pt states that tonight she went to bathroom tonight thinking she had diarrhea but that when she went to bathroom, toilet was full of blood.

## 2019-09-09 NOTE — Progress Notes (Signed)
   Patient seen and evaluated, chart reviewed, please see EMR for updated orders. Please see full H&P dictated by admitting physician Dr Rolla Plate for same date of service.     Brief Summary 85 y.o.femalewith medical history significant forhypothyroidism, Sjogren's syndrome, liver cirrhosis with portal hypertension, depression, chronic diarrhea, with recurrent hospitalization of recurrent transfusion due to recurrent diverticular bleed readmitted again on 09/09/2019 with another episode of diverticular bleed  A/p 1)Acute blood loss anemia secondary to presumed diverticular bleed--- on admission hemoglobin was down to 7.8 from a recent level of 10.6 on 09/06/19 --Patient received 1 unit of  PRBC on 09/09/19 - Hgb is up to 8.8 from 7.8 -Last bloody BM around 2 PM -GI consult appreciated -May need RBC Tag scan if further bleeding  2)Liver cirrhosis--recently diagnosed, follow-up with GI as outpatient--- may need paracentesis as needed  3) hypothyroidism--TSH elevated at 18, levothyroxine increased to 120 mcg daily  --Per GI service okay to advance to clear liquid diet

## 2019-09-10 ENCOUNTER — Encounter (HOSPITAL_COMMUNITY): Payer: Self-pay | Admitting: Gastroenterology

## 2019-09-10 DIAGNOSIS — K922 Gastrointestinal hemorrhage, unspecified: Secondary | ICD-10-CM | POA: Diagnosis not present

## 2019-09-10 DIAGNOSIS — D62 Acute posthemorrhagic anemia: Secondary | ICD-10-CM | POA: Diagnosis not present

## 2019-09-10 LAB — BASIC METABOLIC PANEL
Anion gap: 6 (ref 5–15)
BUN: 7 mg/dL — ABNORMAL LOW (ref 8–23)
CO2: 22 mmol/L (ref 22–32)
Calcium: 7.9 mg/dL — ABNORMAL LOW (ref 8.9–10.3)
Chloride: 109 mmol/L (ref 98–111)
Creatinine, Ser: 0.55 mg/dL (ref 0.44–1.00)
GFR calc Af Amer: >60 mL/min (ref >60)
GFR calc non Af Amer: >60 mL/min (ref >60)
Glucose, Bld: 90 mg/dL (ref 70–99)
Potassium: 3.3 mmol/L — ABNORMAL LOW (ref 3.5–5.1)
Sodium: 137 mmol/L (ref 135–145)

## 2019-09-10 LAB — CBC
HCT: 26.6 % — ABNORMAL LOW (ref 36.0–46.0)
Hemoglobin: 8.1 g/dL — ABNORMAL LOW (ref 12.0–15.0)
MCH: 29.5 pg (ref 26.0–34.0)
MCHC: 30.5 g/dL (ref 30.0–36.0)
MCV: 96.7 fL (ref 80.0–100.0)
Platelets: 148 10*3/uL — ABNORMAL LOW (ref 150–400)
RBC: 2.75 MIL/uL — ABNORMAL LOW (ref 3.87–5.11)
RDW: 17.6 % — ABNORMAL HIGH (ref 11.5–15.5)
WBC: 4 10*3/uL (ref 4.0–10.5)
nRBC: 0 % (ref 0.0–0.2)

## 2019-09-10 LAB — TYPE AND SCREEN
ABO/RH(D): A POS
Antibody Screen: NEGATIVE
Unit division: 0

## 2019-09-10 LAB — BPAM RBC
Blood Product Expiration Date: 202012172359
ISSUE DATE / TIME: 202011230412
Unit Type and Rh: 6200

## 2019-09-10 LAB — HEMOGLOBIN AND HEMATOCRIT, BLOOD
HCT: 28.1 % — ABNORMAL LOW (ref 36.0–46.0)
Hemoglobin: 8.6 g/dL — ABNORMAL LOW (ref 12.0–15.0)

## 2019-09-10 MED ORDER — POTASSIUM CHLORIDE CRYS ER 20 MEQ PO TBCR
40.0000 meq | EXTENDED_RELEASE_TABLET | ORAL | Status: AC
  Administered 2019-09-10 (×2): 40 meq via ORAL
  Filled 2019-09-10 (×2): qty 2

## 2019-09-10 MED ORDER — THYROID 146.25 MG PO TABS: 146.2500 mg | ORAL_TABLET | Freq: Every day | ORAL | 1 refills | Status: DC

## 2019-09-10 NOTE — Discharge Summary (Signed)
  Please see full discharge summary from 09/10/2019 that was  erroneously titled progress note   KATLYNE NISHIDA, is a 85 y.o. female  DOB 07-03-1933  MRN [id number].  Admission date:  09/09/2019  Admitting Physician  Rolla Plate, DO  Discharge Date:  09/10/2019   Primary MD  Sharilyn Sites, MD  Recommendations for primary care physician for things to follow:   1)Avoid ibuprofen/Advil/Aleve/Motrin/Goody Powders/Naproxen/BC powders/Meloxicam/Diclofenac/Indomethacin and other Nonsteroidal anti-inflammatory medications as these will make you more likely to bleed and can cause stomach ulcers, can also cause Kidney problems.   2)Follow-up with the primary care physician for repeat complete blood count/CBC test on Friday, 09/13/2019  3) call or return if any further concerns about bleeding    Please see full discharge summary from 09/10/2019 that was  erroneously titled progress note

## 2019-09-10 NOTE — Care Management Obs Status (Signed)
[facility] NOTIFICATION   Patient Details  Name: ZYLPHA POYNOR MRN: [id number] Date of Birth: 01/20/1933   Medicare Observation Status Notification Given:  Yes    Boneta Lucks, RN 09/10/2019, 12:02 PM

## 2019-09-10 NOTE — Care Management CC44 (Signed)
Condition Code 44 Documentation Completed  Patient Details  Name: [name] MRN: [id number] Date of Birth: 27-Nov-1932   Condition Code 44 given:  Yes Patient signature on Condition Code 44 notice:  Yes Documentation of 2 MD's agreement:  Yes Code 44 added to claim:  Yes    Boneta Lucks, RN 09/10/2019, 12:02 PM

## 2019-09-10 NOTE — Progress Notes (Signed)
Had another brown bm with no blood.  IV removed and DC instructions reviewed with patient and daughter.  Unable to follow up with Dr. Hilma Favors on Friday for CBC lab due to holiday,  so appt made for Monday. To  call their answering service if she has  any more GI bleeding.

## 2019-09-10 NOTE — Progress Notes (Signed)
Subjective: States she feels well this morning. Sitting up eating breakfast during our interview. No further bleeding since 2 pm yesterday. Had small BM last night that was brown in color. No bright red blood or melena. No abdominal pain. No nausea or vomiting. No lightheadedness, dizziness, or feeling like she will pass out. Tolerating diet. States she is ready to go home.   Objective: Vital signs in last 24 hours: Temp:  [97.7 F (36.5 C)-98 F (36.7 C)] 97.7 F (36.5 C) (11/23 2115) Pulse Rate:  [81-102] 91 (11/23 2115) Resp:  [13-27] 16 (11/23 2115) BP: (125-144)/(56-78) 144/68 (11/23 2115) SpO2:  [93 %-100 %] 93 % (11/23 2115) Weight:  [51.2 kg] 51.2 kg (11/24 0400) Last BM Date: 09/09/19 General:   Alert and oriented, pleasant Head:  Normocephalic and atraumatic. Eyes:  No icterus, sclera clear. Conjuctiva pink.  Abdomen:  Bowel sounds present, soft, non-tender, non-distended. No masses appreciated  Msk:  Symmetrical without gross deformities. Normal posture. Extremities:  1-2+ LE pitting edema below the knees. Patient states this is improved.  Neurologic:  Alert and  oriented x4;  grossly normal neurologically. Skin:  Warm and dry, intact without significant lesions.  Psych: Normal mood and affect.  Intake/Output from previous day: 11/23 0701 - 11/24 0700 In: 1207.1 [P.O.:240; I.V.:867.1; IV Piggyback:100] Out: 800 [Urine:800] Intake/Output this shift: No intake/output data recorded.  Lab Results: Recent Labs    09/09/19 0110 09/09/19 0702 09/09/19 1527 09/10/19 0503  WBC 9.4 4.9  --  4.0  HGB 7.8* 8.2* 8.8* 8.1*  HCT 25.4* 26.1* 27.9* 26.6*  PLT 185 136*  --  148*   BMET Recent Labs    09/09/19 0110 09/09/19 0702 09/10/19 0503  NA 136 137 137  K 3.4* 3.9 3.3*  CL 107 105 109  CO2 23 24 22   GLUCOSE 127* 109* 90  BUN 10 9 7*  CREATININE 0.61 0.60 0.55  CALCIUM 7.8* 8.0* 7.9*   LFT Recent Labs    09/09/19 0110 09/09/19 0702  PROT 4.7* 4.5*   ALBUMIN 2.2* 2.1*  AST 32 30  ALT 18 16  ALKPHOS 59 46  BILITOT 0.8 1.0   PT/INR Recent Labs    09/09/19 0702  LABPROT 15.8*  INR 1.3*   Assessment: 85 year old female with newly diagnosed cirrhosis incidentally found at the time of her first admission in November 2020, now with third presentation for acute large volume painless hematochezia and acute blood loss anemia.  First admission on 11/10 requiring 3 units PRBCs, second admission 11/18 requiring 2 units PRBCs.  Prior evaluation by Dr. Laural Golden while inpatient during her first admission with EGD and colonoscopy: No evidence for varices, positive portal gastropathy, lower GI with diverticula and 2 angiodysplastic lesions but no stigmata of bleeding.  Felt lower GI bleed likely due to diverticular origin.  She presented on 11/23 with hemoglobin 7.8, down from 10.6 at last discharge.  She received 1 unit PRBCs with post-transfusion hemoglobin 8.2.  Hemoglobin 8.1 today.  Patient denies any further overt GI bleeding and is overall feeling improved.  Per last plan outlined by Dr. Laural Golden, would need to pursue bleeding scan if she were to rebleed.  As she is without any over GI bleeding since 2 pm yesterday, advise we continue to monitor for rebleeding.  Suspect diverticular origin of hematochezia.  Unable to exclude small bowel lesion.  Cirrhosis: Newly diagnosed several weeks ago. Felt to be cryptogenic vs due to Sjogren's. No varices on recent EGD.  She  is clinically stable.  Ascites was noted on ultrasound several weeks ago; however abdomen is soft without tenderness to palpation on exam today.  No need for paracentesis unless she develops tense ascites.  Plan: Continue PPI twice daily Follow H/H Continue to monitor for any recurrence of overt GI bleeding.  If she rebleeds, would need stat bleeding scan. Advance diet to full liquids. Continue to avoid NSAIDs. Follow-up with Dr. Karilyn Cota for cirrhosis care as outpatient. Continue supportive  measures.   LOS: 1 day    09/10/2019, 7:47 AM   Ermalinda Memos, PA-C [facility]

## 2019-09-10 NOTE — Care Management Important Message (Signed)
Important Message  Patient Details  Name: [name] MRN: [id number] Date of Birth: 02-Sep-1933   Medicare Important Message Given:  Yes     Boneta Lucks, RN 09/10/2019, 12:00 PM

## 2019-09-10 NOTE — Discharge Instructions (Signed)
1)Avoid ibuprofen/Advil/Aleve/Motrin/Goody Powders/Naproxen/BC powders/Meloxicam/Diclofenac/Indomethacin and other Nonsteroidal anti-inflammatory medications as these will make you more likely to bleed and can cause stomach ulcers, can also cause Kidney problems.   2)Follow-up with the primary care physician for repeat complete blood count/CBC test on Friday, 09/13/2019  3) call or return if any further concerns about bleeding

## 2019-09-10 NOTE — Progress Notes (Addendum)
[name], is a 85 y.o. female  DOB [birth year]  MRN [id number].  Admission date:  09/09/2019  Admitting Physician  Lilyan GilfordAsia B Zierle-Ghosh, DO  Discharge Date:  09/10/2019   Primary MD  Assunta FoundGolding, John, MD  Recommendations for primary care physician for things to follow:   1)Avoid ibuprofen/Advil/Aleve/Motrin/Goody Powders/Naproxen/BC powders/Meloxicam/Diclofenac/Indomethacin and other Nonsteroidal anti-inflammatory medications as these will make you more likely to bleed and can cause stomach ulcers, can also cause Kidney problems.   2)Follow-up with the primary care physician for repeat complete blood count/CBC test on Friday, 09/13/2019  3) call or return if any further concerns about bleeding  Admission Diagnosis  Lower GI bleed [K92.2]   Discharge Diagnosis  Lower GI bleed [K92.2]    Active Problems:   GI hemorrhage      Past Medical History:  Diagnosis Date   Cirrhosis (HCC)    GERD (gastroesophageal reflux disease)    GI bleed    Hypertension    Sjogren's disease (HCC)    Thyroid disease    hypothyroidism    Past Surgical History:  Procedure Laterality Date   COLONOSCOPY WITH ESOPHAGOGASTRODUODENOSCOPY (EGD) AND ESOPHAGEAL DILATION (ED)     years ago   COLONOSCOPY WITH PROPOFOL N/A 08/30/2019   multiple diverticula to sigmoid without evidence of active bleeding.Two non-bleeding colonic angiodysplastic lesions.    complete hysterectomy     ESOPHAGOGASTRODUODENOSCOPY (EGD) WITH PROPOFOL N/A 08/30/2019    short segment Barrett's esophagus, hiatal hernia, portal gastropathy.   left rotator cuff         HPI  from the history and physical done on the day of admission:    Julie CapersCarolyn H Peterson a 85 y.o.femalewith medical history significant forhypothyroidism, Sjogren's syndrome, liver cirrhosis with portal hypertension, depression, chronic diarrhea, and recent discharge  on 11/15 for acute blood loss anemia due to diverticular GI bleed who presented back to the ED with bright red blood in her toilet bowl. She was recently transfused 3 units of PRBCs and was discharged with a hemoglobin of 9.5 and then returned to ED on  11/18 with hemoglobin 7.8. She was transfused 2 units with appropriate improvement of Hgb. She then, again returns to the ED 11/23 with bright red blood per rectum and a Hgb of 7.8.   Patient reports that today she was actually feeling stronger and better than she had in the past week.  She reports she was up and around and attending to her own ADLs.  She suddenly felt like she had to go to the bathroom and that it was urgent.  When she got into the bathroom she noticed blood on her panty liner, and then she had bright red blood per rectum dripping into the toilet at the rate of a leaky faucet.  After that she felt like she had to have a bowel movement, and that filled the toilet with blood.  It has been just this 1 episode.  Prior to her previous 2 hospital stays it was only 1-2 episodes as well.  At the end of her bowel movement she was pale, felt weak, and short of breath.  Family called Dr. Karilyn Cota who advised her to come back to the hospital.  In the ED Patient is hypokalemic at 3.4.  Hypoalbuminemia at 2.2.  Acute blood loss anemia the hemoglobin is 7.8, down from 10.6 at last discharge.  Patient was typed screen and crossmatch and 1 unit of blood was ordered.     Hospital Course:    Brief Summary 85 y.o.femalewith medical history significant forhypothyroidism, Sjogren's syndrome, liver cirrhosis with portal hypertension, depression, chronic diarrhea, with recurrent hospitalization of recurrent transfusion due to recurrent diverticular bleed readmitted again on 09/09/2019 with another episode of diverticular bleed  A/p 1)Acute blood loss anemia secondary to presumed diverticular bleed--- on admission hemoglobin was down to 7.8 from a  recent level of 10.6 on 09/06/19 --Patient received 1 unit of  PRBC on 09/09/19 - Hgb is up to 8.6 from 7.8 -No further bloody BMs, patient actually had 2 BMs on the day of discharge that were brown -GI consult appreciated -May need RBC Tag scan if further bleeding  2)Liver cirrhosis--recently diagnosed, follow-up with GI as outpatient--- may need paracentesis as needed, can be arranged as outpatient by GI service  3) hypothyroidism--TSH elevated at 18, [place] Thyroid increased to 146.25 from 120 mcg daily  Discharge Condition: stable  Follow UP--PCP for repeat CBC, and GI service Dr. Karilyn Cota for management of liver cirrhosis   Diet and Activity recommendation:  As advised  Discharge Instructions    Discharge Instructions    Call MD for:  difficulty breathing, headache or visual disturbances   Complete by: As directed    Call MD for:  persistant dizziness or light-headedness   Complete by: As directed    Call MD for:  persistant nausea and vomiting   Complete by: As directed    Call MD for:  temperature >100.4   Complete by: As directed    Diet - low sodium heart healthy   Complete by: As directed    Discharge instructions   Complete by: As directed    1)Avoid ibuprofen/Advil/Aleve/Motrin/Goody Powders/Naproxen/BC powders/Meloxicam/Diclofenac/Indomethacin and other Nonsteroidal anti-inflammatory medications as these will make you more likely to bleed and can cause stomach ulcers, can also cause Kidney problems.   2)Follow-up with the primary care physician for repeat complete blood count/CBC test on Friday, 09/13/2019  3) call or return if any further concerns about bleeding   Increase activity slowly   Complete by: As directed         Discharge Medications     Allergies as of 09/10/2019      Reactions   Ciprofloxacin Other (See Comments)   NO REACTION LISTED   Ciprocin-fluocin-procin [fluocinolone Acetonide]    Codeine    Demerol    Penicillins    Did it  involve swelling of the face/tongue/throat, SOB, or low BP? Unknown Did it involve sudden or severe rash/hives, skin peeling, or any reaction on the inside of your mouth or nose? Unknown Did you need to seek medical attention at a hospital or doctor's office? Yes When did it last happen?Uknown If all above answers are "NO", may proceed with cephalosporin use. No issue with ceftriaxone   Sulfa Antibiotics       Medication List    TAKE these medications   BENEFIBER DRINK MIX PO Take 1 packet by mouth daily. With breakfast   Calcium-Magnesium-Zinc 333-133-5 MG Tabs Take by mouth.   co-enzyme Q-10 30 MG  capsule Take 30 mg by mouth daily.   esomeprazole 20 MG capsule Commonly known as: NEXIUM Take 1 capsule (20 mg total) by mouth at bedtime.   FISH OIL PO Take 1 capsule by mouth daily.   Iron 325 (65 Fe) MG Tabs Take 1 tablet (325 mg total) by mouth daily at 2 PM.   loperamide 2 MG capsule Commonly known as: IMODIUM Take 2 mg by mouth as needed for diarrhea or loose stools.   nystatin 100000 UNIT/ML suspension Commonly known as: MYCOSTATIN Take 5 mLs (500,000 Units total) by mouth 4 (four) times daily for 7 days.   sertraline 25 MG tablet Commonly known as: ZOLOFT Take 25 mg by mouth daily.   Thyroid 146.25 MG Tabs Take 146.25 mg by mouth daily before breakfast. Start taking on: September 11, 2019 What changed:   medication strength  how much to take  additional instructions   Turmeric 400 MG Caps Take 400 mg by mouth daily.   Vitamin D3 10 MCG (400 UNIT) Caps Take 1,000 mg by mouth daily.       Major procedures and Radiology Reports - PLEASE review detailed and final reports for all details, in brief -   Cxr  Result Date: 08/27/2019 CLINICAL DATA:  Rectal blood EXAM: CHEST - 2 VIEW COMPARISON:  November 04, 2014 FINDINGS: There is mild cardiomegaly. Aortic knob calcifications. No large airspace consolidation or pleural effusion. There is streaky  atelectasis or scarring seen at the left lung base. No acute osseous abnormality. IMPRESSION: Mild cardiomegaly. Streaky atelectasis or scarring at the left lung base. Electronically Signed   By: Jonna Clark M.D.   On: 08/27/2019 21:38   Ct Head Wo Contrast  Result Date: 08/27/2019 CLINICAL DATA:  cHead trauma, minor, GCS>=13, high clinical risk, initial exam, syncope. C-spine trauma, ligamentous injury suspected. EXAM: CT HEAD WITHOUT CONTRAST CT CERVICAL SPINE WITHOUT CONTRAST TECHNIQUE: Multidetector CT imaging of the head and cervical spine was performed following the standard protocol without intravenous contrast. Multiplanar CT image reconstructions of the cervical spine were also generated. COMPARISON:  Head CT 06/13/2008 FINDINGS: CT HEAD FINDINGS Brain: No evidence of acute intracranial hemorrhage. No demarcated cortical infarction. No evidence of intracranial mass. No midline shift or extra-axial fluid collection. Minimal ill-defined hypoattenuation within the cerebral white matter is nonspecific, but consistent with chronic small vessel ischemic disease. Mild generalized parenchymal atrophy. Vascular: No hyperdense vessel. Skull: Normal. Negative for fracture or focal lesion. Sinuses/Orbits: Visualized orbits demonstrate no acute abnormality. No significant paranasal sinus disease or mastoid effusion. CT CERVICAL SPINE FINDINGS Mildly motion degraded examination. Alignment: No significant spondylolisthesis Skull base and vertebrae: The basion-dental and atlanto-dental intervals are maintained.No evidence of acute fracture to the cervical spine. Soft tissues and spinal canal: No prevertebral fluid or swelling. No visible canal hematoma. Disc levels: Cervical spondylosis with multilevel posterior disc osteophytes, uncovertebral and facet hypertrophy. Upper chest: Emphysema within the imaged lung apices. No visible pneumothorax. Other: Carotid artery atherosclerotic calcification. IMPRESSION: CT head:  1. No evidence of acute intracranial abnormality. 2. Mild generalized parenchymal atrophy and chronic small vessel ischemic disease CT cervical spine: 1. No evidence of acute fracture to the cervical spine. 2. Cervical spondylosis as described. Electronically Signed   By: Jackey Loge DO   On: 08/27/2019 14:03   Ct Cervical Spine Wo Contrast  Result Date: 08/27/2019 CLINICAL DATA:  Cervical spine trauma secondary to a fall. The patient was found down in the bathroom. EXAM: CT CERVICAL SPINE WITHOUT CONTRAST TECHNIQUE: Multidetector  CT imaging of the cervical spine was performed without intravenous contrast. Multiplanar CT image reconstructions were also generated. COMPARISON:  None. FINDINGS: Alignment: Normal. Skull base and vertebrae: No acute fracture. No primary bone lesion or focal pathologic process. Soft tissues and spinal canal: The prevertebral soft tissues are normal. There is a 15 mm rim calcified nodule in the right lobe of the thyroid gland. No visible canal hematoma. Disc levels: C2-3: Tiny central disc bulge with no neural impingement. Moderate right facet arthritis. No foraminal stenosis. C3-4: Marked disc space narrowing. Small broad-based disc osteophyte complex most prominent centrally. No foraminal stenosis. C4-5: Chronic disc space narrowing. Small disc bulge with accompanying osteophytes to the left of midline. No foraminal stenosis. C5-6: Chronic disc space narrowing. Slight narrowing of the right neural foramen due to uncinate spurs. No disc bulging or protrusion. C6-7: Disc space narrowing. Minimal endplate osteophytes to the left of midline without neural impingement. Widely patent neural foramina. C7-T1: Normal disc. Slight left facet arthritis. No foraminal stenosis. Upper chest: Negative. Other: None IMPRESSION: 1. No acute abnormality of the cervical spine. Multilevel degenerative disc and joint disease. 2. 15 mm rim calcified nodule in the right lobe of the thyroid gland.  Electronically Signed   By: Lorriane Shire M.D.   On: 08/27/2019 13:57   US Abdomen Complete  Result Date: 08/28/2019 CLINICAL DATA:  Abdominal pain and rectal bleeding. EXAM: ABDOMEN ULTRASOUND COMPLETE COMPARISON:  CT AP 08/27/2019 FINDINGS: Gallbladder: Gallbladder wall is thickened measuring 7 mm. Negative sonographic Murphy's sign. No stone or sludge identified. Common bile duct: Diameter: 5 mm Liver: Micro nodular contour of the liver is identified with heterogeneous echotexture. Portal vein is patent on color Doppler imaging with normal direction of blood flow towards the liver. IVC: No abnormality visualized. Pancreas: Visualized portion unremarkable. Spleen: Size and appearance within normal limits. Right Kidney: Length: 10.1 cm. Echogenicity within normal limits. No mass or hydronephrosis visualized. Left Kidney: Length: 10.8 cm. Cyst within inferior pole of the left kidney measures 5.4 x 4.3 x 4.7 cm. Echogenicity within normal limits. No mass or hydronephrosis visualized. Abdominal aorta: No aneurysm visualized.Aortic atherosclerosis. Other findings: Ascites and bilateral pleural effusions. IMPRESSION: 1. Cirrhosis of the liver. Ascites is identified compatible with portal venous hypertension. 2. Gallbladder wall thickening. In the setting of portal venous hypertension and ascites this may be a nonspecific finding. No gallstones or sonographic Murphy's sign noted. 3. Ascites, bilateral pleural effusions. 4.  Aortic Atherosclerosis (ICD10-I70.0). Electronically Signed   By: Kerby Moors M.D.   On: 08/28/2019 11:28   Ct Abdomen Pelvis W Contrast  Result Date: 08/27/2019 CLINICAL DATA:  85 year old female with abdominal pain and rectal bleeding. EXAM: CT ABDOMEN AND PELVIS WITH CONTRAST TECHNIQUE: Multidetector CT imaging of the abdomen and pelvis was performed using the standard protocol following bolus administration of intravenous contrast. CONTRAST:  149mL OMNIPAQUE IOHEXOL 300 MG/ML  SOLN  COMPARISON:  None. FINDINGS: Lower chest: Partially visualized small bilateral pleural effusions with associated partial compressive atelectasis of the lung bases. Pneumonia is not excluded. Clinical correlation is recommended. There is diffuse interstitial and interlobular septal thickening of the visualized lung bases most consistent with edema. There is mild cardiomegaly. A pericardial effusion is partially visualized measuring approximately 1 cm in thickness. No intra-abdominal free air. There is a moderate size ascites. Hepatobiliary: Nodular liver contour in keeping with cirrhosis. No intrahepatic biliary ductal dilatation. The gallbladder is unremarkable. Pancreas: Unremarkable. No pancreatic ductal dilatation or surrounding inflammatory changes. Spleen: Normal in size without  focal abnormality. Adrenals/Urinary Tract: The right adrenal gland is unremarkable. The left adrenal gland is not well visualized. There is no hydronephrosis on either side. There is symmetric enhancement and excretion of contrast by both kidneys. There is a 4.5 cm left renal inferior pole cyst. The urinary bladder is grossly unremarkable. Stomach/Bowel: There is sigmoid diverticulosis without active inflammatory changes. There is diffuse thickened appearance of the colon which may be related to ascites and hepatic colopathy. Colitis is not excluded. Clinical correlation is recommended. Similarly there is diffuse thickened appearance of the stomach and small bowel likely related to ascites and liver disease. There is a small hiatal hernia with evidence of gastroesophageal reflux. The appendix is not visualized with certainty. Vascular/Lymphatic: There is advanced aortoiliac atherosclerotic disease. The IVC is unremarkable. No portal venous gas. There is no adenopathy. The SMV, splenic vein, and main portal vein are patent. Reproductive: Hysterectomy. No adnexal masses. Other: Diffuse subcutaneous edema and anasarca. Musculoskeletal:  Degenerative changes of the spine. No acute osseous pathology. IMPRESSION: 1. Cirrhosis with evidence of portal hypertension, moderate ascites, and anasarca. 2. Thickened appearance of the colon and small bowel likely related to ascites and liver disease. Colitis is not excluded. Clinical correlation is recommended. 3. Sigmoid diverticulosis. 4. Cardiomegaly with findings of CHF. Partially visualized small bilateral pleural effusions with associated partial compressive atelectasis of the lung bases. Pneumonia is not excluded. Clinical correlation is recommended. Aortic Atherosclerosis (ICD10-I70.0). Electronically Signed   By: Elgie Collard M.D.   On: 08/27/2019 14:01    Micro Results   Recent Results (from the past 240 hour(s))  SARS CORONAVIRUS 2 (TAT 6-24 HRS) Nasopharyngeal Nasopharyngeal Swab     Status: None   Collection Time: 09/04/19  8:21 AM   Specimen: Nasopharyngeal Swab  Result Value Ref Range Status   SARS Coronavirus 2 NEGATIVE NEGATIVE Final    Comment: (NOTE) SARS-CoV-2 target nucleic acids are NOT DETECTED. The SARS-CoV-2 RNA is generally detectable in upper and lower respiratory specimens during the acute phase of infection. Negative results do not preclude SARS-CoV-2 infection, do not rule out co-infections with other pathogens, and should not be used as the sole basis for treatment or other patient management decisions. Negative results must be combined with clinical observations, patient history, and epidemiological information. The expected result is Negative. Fact Sheet for Patients: HairSlick.no Fact Sheet for Healthcare Providers: quierodirigir.com This test is not yet approved or cleared by the [place] FDA and  has been authorized for detection and/or diagnosis of SARS-CoV-2 by FDA under an Emergency Use Authorization (EUA). This EUA will remain  in effect (meaning this test can be used) for the  duration of the COVID-19 declaration under Section 56 4(b)(1) of the Act, 21 U.S.C. section 360bbb-3(b)(1), unless the authorization is terminated or revoked sooner. Performed at [facility], [address] [place], [place] [address]   SARS CORONAVIRUS 2 (TAT 6-24 HRS) Nasopharyngeal Nasopharyngeal Swab     Status: None   Collection Time: 09/09/19  3:43 AM   Specimen: Nasopharyngeal Swab  Result Value Ref Range Status   SARS Coronavirus 2 NEGATIVE NEGATIVE Final    Comment: (NOTE) SARS-CoV-2 target nucleic acids are NOT DETECTED. The SARS-CoV-2 RNA is generally detectable in upper and lower respiratory specimens during the acute phase of infection. Negative results do not preclude SARS-CoV-2 infection, do not rule out co-infections with other pathogens, and should not be used as the sole basis for treatment or other patient management decisions. Negative results must be  combined with clinical observations, patient history, and epidemiological information. The expected result is Negative. Fact Sheet for Patients: HairSlick.no Fact Sheet for Healthcare Providers: quierodirigir.com This test is not yet approved or cleared by the [place] FDA and  has been authorized for detection and/or diagnosis of SARS-CoV-2 by FDA under an Emergency Use Authorization (EUA). This EUA will remain  in effect (meaning this test can be used) for the duration of the COVID-19 declaration under Section 56 4(b)(1) of the Act, 21 U.S.C. section 360bbb-3(b)(1), unless the authorization is terminated or revoked sooner. Performed at [facility], [address] [place], [place] [address]        Today   Subjective    Iretta Mangrum today has no new complaints          Patient has been seen and examined prior to discharge   Objective   Blood pressure (!) 157/65, pulse 81, temperature 97.7 F (36.5 C), temperature source  Oral, resp. rate 16, height  (1.549 m), weight 51.2 kg, SpO2 91 %.   Intake/Output Summary (Last 24 hours) at 09/10/2019 1529 Last data filed at 09/10/2019 1500 Gross per 24 hour  Intake 1207.09 ml  Output 1050 ml  Net 157.09 ml    Exam Gen:- Awake Alert, no acute distress  HEENT:- [place].AT, No sclera icterus Neck-Supple Neck,No JVD,.  Lungs-  CTAB , good air movement bilaterally  CV- S1, S2 normal, regular Abd-  +ve B.Sounds, Abd Soft, No tenderness,    Extremity/Skin:- No  edema,   good pulses Psych-affect is appropriate, oriented x3 Neuro-no new focal deficits, no tremors    Data Review   CBC w Diff:  Lab Results  Component Value Date   WBC 4.0 09/10/2019   HGB 8.6 (L) 09/10/2019   HCT 28.1 (L) 09/10/2019   PLT 148 (L) 09/10/2019   LYMPHOPCT 6 09/09/2019   MONOPCT 9 09/09/2019   EOSPCT 1 09/09/2019   BASOPCT 0 09/09/2019    CMP:  Lab Results  Component Value Date   NA 137 09/10/2019   K 3.3 (L) 09/10/2019   CL 109 09/10/2019   CO2 22 09/10/2019   BUN 7 (L) 09/10/2019   CREATININE 0.55 09/10/2019   PROT 4.5 (L) 09/09/2019   ALBUMIN 2.1 (L) 09/09/2019   BILITOT 1.0 09/09/2019   ALKPHOS 46 09/09/2019   AST 30 09/09/2019   ALT 16 09/09/2019  .   Total Discharge time is about 33 minutes  Shon Hale M.D on 09/10/2019 at 3:29 PM  Go to [email or web address] -  for contact info  [facility] - Office  [phone]

## 2019-09-10 NOTE — Progress Notes (Signed)
17 beat run svt, notified Dr. Maurene Capes.  Patient has had no blood from rectum since the episode yesterday afternoon.  Had brown, yellow bm this morning with no blood.

## 2019-09-12 ENCOUNTER — Encounter (HOSPITAL_COMMUNITY): Payer: Self-pay | Admitting: Emergency Medicine

## 2019-09-12 ENCOUNTER — Emergency Department (HOSPITAL_COMMUNITY)
Admission: EM | Admit: 2019-09-12 | Discharge: 2019-09-12 | Disposition: A | Discharge status: Home / Self Care | Payer: Medicare Other | Attending: Emergency Medicine | Admitting: Emergency Medicine

## 2019-09-12 ENCOUNTER — Other Ambulatory Visit: Payer: Self-pay

## 2019-09-12 DIAGNOSIS — I10 Essential (primary) hypertension: Secondary | ICD-10-CM | POA: Insufficient documentation

## 2019-09-12 DIAGNOSIS — K625 Hemorrhage of anus and rectum: Secondary | ICD-10-CM | POA: Insufficient documentation

## 2019-09-12 LAB — CBC WITH DIFFERENTIAL/PLATELET
Abs Immature Granulocytes: 0.01 10*3/uL (ref 0.00–0.07)
Basophils Absolute: 0 10*3/uL (ref 0.0–0.1)
Basophils Relative: 1 %
Eosinophils Absolute: 0.2 10*3/uL (ref 0.0–0.5)
Eosinophils Relative: 3 %
HCT: 28.2 % — ABNORMAL LOW (ref 36.0–46.0)
Hemoglobin: 8.4 g/dL — ABNORMAL LOW (ref 12.0–15.0)
Immature Granulocytes: 0 %
Lymphocytes Relative: 12 %
Lymphs Abs: 0.7 10*3/uL (ref 0.7–4.0)
MCH: 29.3 pg (ref 26.0–34.0)
MCHC: 29.8 g/dL — ABNORMAL LOW (ref 30.0–36.0)
MCV: 98.3 fL (ref 80.0–100.0)
Monocytes Absolute: 0.9 10*3/uL (ref 0.1–1.0)
Monocytes Relative: 14 %
Neutro Abs: 4.2 10*3/uL (ref 1.7–7.7)
Neutrophils Relative %: 70 %
Platelets: 191 10*3/uL (ref 150–400)
RBC: 2.87 MIL/uL — ABNORMAL LOW (ref 3.87–5.11)
RDW: 17.2 % — ABNORMAL HIGH (ref 11.5–15.5)
WBC: 6 10*3/uL (ref 4.0–10.5)
nRBC: 0 % (ref 0.0–0.2)

## 2019-09-12 LAB — TYPE AND SCREEN
ABO/RH(D): A POS
Antibody Screen: NEGATIVE

## 2019-09-12 LAB — BASIC METABOLIC PANEL
Anion gap: 9 (ref 5–15)
BUN: 10 mg/dL (ref 8–23)
CO2: 21 mmol/L — ABNORMAL LOW (ref 22–32)
Calcium: 8.3 mg/dL — ABNORMAL LOW (ref 8.9–10.3)
Chloride: 107 mmol/L (ref 98–111)
Creatinine, Ser: 0.68 mg/dL (ref 0.44–1.00)
GFR calc Af Amer: >60 mL/min (ref >60)
GFR calc non Af Amer: >60 mL/min (ref >60)
Glucose, Bld: 121 mg/dL — ABNORMAL HIGH (ref 70–99)
Potassium: 4.1 mmol/L (ref 3.5–5.1)
Sodium: 137 mmol/L (ref 135–145)

## 2019-09-12 LAB — CBG MONITORING, ED: Glucose-Capillary: 105 mg/dL — ABNORMAL HIGH (ref 70–99)

## 2019-09-12 NOTE — ED Triage Notes (Signed)
Pt c/o rectal bleeding and "blood just running out" since Nov 10. Has been admitted but still happening. Has been intermittent , last time happened was 2days ago then stopped and started again today. Pt denies pain. Mildly pale. C/o gen weakness. A/o.

## 2019-09-12 NOTE — ED Notes (Signed)
Pt and daughter reports pt having brown stool but "there is some blood leaking from somewhere."  Reports being here multiple times in last few weeks but not able to find out where the bleeding is coming from.  Pt has follow appointment on Monday but daughter wanted check blood work today.

## 2019-09-15 NOTE — ED Provider Notes (Signed)
[facility] EMERGENCY DEPARTMENT Provider Note   CSN: [id number] Arrival date & time: 09/12/19  1607     History   Chief Complaint Chief Complaint  Patient presents with  . Rectal Bleeding    HPI [name] is a 85 y.o. female.     HPI   85 year old female with rectal bleeding.  Recent admission for the same.  Patient/daughter report some dripping of blood in the toilet bowl.  Some brown stool noted as well.  Denies any acute pain.  She is not anticoagulated.  Has felt very fatigued/tired but is not acutely worse in the last couple days.  Past Medical History:  Diagnosis Date  . Cirrhosis (HCC)   . GERD (gastroesophageal reflux disease)   . GI bleed   . Hypertension   . Sjogren's disease (HCC)   . Thyroid disease    hypothyroidism    Patient Active Problem List   Diagnosis Date Noted  . GI hemorrhage 09/09/2019  . Acute blood loss anemia 09/01/2019  . Lower GI bleed 09/01/2019  . Syncope 09/01/2019  . Hypothyroidism 09/01/2019  . Hypokalemia 09/01/2019  . Chronic diarrhea 09/01/2019  . Symptomatic anemia 08/27/2019  . Portal hypertension --- liver cirrhosis 08/27/2019  . Cirrhosis of liver with ascites and portal hypertension 08/27/2019  . Diarrhea 04/11/2019    Past Surgical History:  Procedure Laterality Date  . COLONOSCOPY WITH ESOPHAGOGASTRODUODENOSCOPY (EGD) AND ESOPHAGEAL DILATION (ED)     years ago  . COLONOSCOPY WITH PROPOFOL N/A 08/30/2019   multiple diverticula to sigmoid without evidence of active bleeding.Two non-bleeding colonic angiodysplastic lesions.   . complete hysterectomy    . ESOPHAGOGASTRODUODENOSCOPY (EGD) WITH PROPOFOL N/A 08/30/2019    short segment Barrett's esophagus, hiatal hernia, portal gastropathy.  . left rotator cuff       OB History   No obstetric history on file.      Home Medications    Prior to Admission medications   Medication Sig Start Date End Date Taking? Authorizing Provider   Calcium-Magnesium-Zinc [phone] MG TABS Take by mouth.   Yes [provider]  Cholecalciferol (VITAMIN D3) 10 MCG (400 UNIT) CAPS Take 1,000 mg by mouth daily.    Yes [provider]  co-enzyme Q-10 30 MG capsule Take 30 mg by mouth daily.    Yes [provider]  esomeprazole (NEXIUM) 20 MG capsule Take 1 capsule (20 mg total) by mouth at bedtime. 09/01/19  Yes Erick BlinksMemon, Jehanzeb, MD  Ferrous Sulfate (IRON) 325 (65 Fe) MG TABS Take 1 tablet (325 mg total) by mouth daily at 2 PM. 09/01/19  Yes Memon, Durward MallardJehanzeb, MD  loperamide (IMODIUM) 2 MG capsule Take 2 mg by mouth as needed for diarrhea or loose stools.   Yes [provider]  Omega-3 Fatty Acids (FISH OIL PO) Take 1 capsule by mouth daily.   Yes [provider]  sertraline (ZOLOFT) 25 MG tablet Take 25 mg by mouth daily.   Yes [provider]  thyroid 146.25 MG TABS Take 146.25 mg by mouth daily before breakfast. 09/11/19  Yes Emokpae, Courage, MD  Turmeric 400 MG CAPS Take 400 mg by mouth daily.    Yes [provider]  Wheat Dextrin (BENEFIBER DRINK MIX PO) Take 1 packet by mouth daily. With breakfast   Yes [provider]    Family History Family History  Problem Relation Age of Onset  . Colon cancer Neg Hx   . Colon polyps Neg Hx     Social  History Social History   Tobacco Use  . Smoking status: Never Smoker  . Smokeless tobacco: Never Used  Substance Use Topics  . Alcohol use: Never    Frequency: Never  . Drug use: Never     Allergies   Ciprofloxacin, Ciprocin-fluocin-procin [fluocinolone acetonide], Codeine, Demerol, Penicillins, and Sulfa antibiotics   Review of Systems Review of Systems All systems reviewed and negative, other than as noted in HPI.   Physical Exam Updated Vital Signs BP (!) 138/58 (BP Location: Left Arm)   Pulse 93   Temp 98.1 F (36.7 C) (Oral)   Resp 15   SpO2 94%   Physical Exam Vitals signs and nursing note reviewed.   Constitutional:      General: She is not in acute distress.    Appearance: She is well-developed.  HENT:     Head: Normocephalic and atraumatic.  Eyes:     General:        Right eye: No discharge.        Left eye: No discharge.     Conjunctiva/sclera: Conjunctivae normal.  Neck:     Musculoskeletal: Neck supple.  Cardiovascular:     Rate and Rhythm: Normal rate and regular rhythm.     Heart sounds: Normal heart sounds. No murmur. No friction rub. No gallop.   Pulmonary:     Effort: Pulmonary effort is normal. No respiratory distress.     Breath sounds: Normal breath sounds.  Abdominal:     General: There is no distension.     Palpations: Abdomen is soft.     Tenderness: There is no abdominal tenderness.  Musculoskeletal:        General: No tenderness.  Skin:    General: Skin is warm and dry.  Neurological:     Mental Status: She is alert.  Psychiatric:        Behavior: Behavior normal.        Thought Content: Thought content normal.      ED Treatments / Results  Labs (all labs ordered are listed, but only abnormal results are displayed) Labs Reviewed  CBC WITH DIFFERENTIAL/PLATELET - Abnormal; Notable for the following components:      Result Value   RBC 2.87 (*)    Hemoglobin 8.4 (*)    HCT 28.2 (*)    MCHC 29.8 (*)    RDW 17.2 (*)    All other components within normal limits  BASIC METABOLIC PANEL - Abnormal; Notable for the following components:   CO2 21 (*)    Glucose, Bld 121 (*)    Calcium 8.3 (*)    All other components within normal limits  CBG MONITORING, ED - Abnormal; Notable for the following components:   Glucose-Capillary 105 (*)    All other components within normal limits  TYPE AND SCREEN    EKG None  Radiology No results found.  Procedures Procedures (including critical care time)  Medications Ordered in ED Medications - No data to display   Initial Impression / Assessment and Plan / ED Course  I have reviewed the triage vital  signs and the nursing notes.  Pertinent labs & imaging results that were available during my care of the patient were reviewed by me and considered in my medical decision making (see chart for details).        85 year old female with rectal bleeding.  Has been a recent issue with recent admissions.  Began having bright red bleeding per rectum again today.  Daughter states that  she feels like quantity has been less than what it had been though.  H/H is currently stable from recent labs.  She feels generally tired but this is been ongoing since her recent illness.  At this time I do not feel that she needs emergent transfusion.  I do not know what additional benefit she would get from being in the hospital at this time aside from observation.  She has established outpatient follow-up.  Patient and daughter both comfortable discharge at this time.  Strict return precautions were discussed.  Final Clinical Impressions(s) / ED Diagnoses   Final diagnoses:  Rectal bleeding    ED Discharge Orders    None       Raeford Razor, MD 09/15/19 [phone]

## 2019-09-16 DIAGNOSIS — E039 Hypothyroidism, unspecified: Secondary | ICD-10-CM | POA: Diagnosis not present

## 2019-09-16 DIAGNOSIS — Z681 Body mass index (BMI) 19 or less, adult: Secondary | ICD-10-CM | POA: Diagnosis not present

## 2019-09-16 DIAGNOSIS — E7849 Other hyperlipidemia: Secondary | ICD-10-CM | POA: Diagnosis not present

## 2019-09-16 DIAGNOSIS — K922 Gastrointestinal hemorrhage, unspecified: Secondary | ICD-10-CM | POA: Diagnosis not present

## 2019-09-16 DIAGNOSIS — I10 Essential (primary) hypertension: Secondary | ICD-10-CM | POA: Diagnosis not present

## 2019-09-17 ENCOUNTER — Other Ambulatory Visit: Payer: Self-pay

## 2019-09-17 ENCOUNTER — Inpatient Hospital Stay (HOSPITAL_COMMUNITY)
Admission: EM | Admit: 2019-09-17 | Discharge: 2019-09-18 | DRG: 812 | Disposition: A | Discharge status: Home Health | Payer: Medicare Other | Attending: Family Medicine | Admitting: Family Medicine

## 2019-09-17 ENCOUNTER — Emergency Department (HOSPITAL_COMMUNITY): Payer: Medicare Other

## 2019-09-17 ENCOUNTER — Encounter (HOSPITAL_COMMUNITY): Payer: Self-pay | Admitting: Internal Medicine

## 2019-09-17 DIAGNOSIS — Z880 Allergy status to penicillin: Secondary | ICD-10-CM

## 2019-09-17 DIAGNOSIS — R7989 Other specified abnormal findings of blood chemistry: Secondary | ICD-10-CM

## 2019-09-17 DIAGNOSIS — D509 Iron deficiency anemia, unspecified: Principal | ICD-10-CM | POA: Diagnosis present

## 2019-09-17 DIAGNOSIS — I10 Essential (primary) hypertension: Secondary | ICD-10-CM | POA: Insufficient documentation

## 2019-09-17 DIAGNOSIS — R7402 Elevation of levels of lactic acid dehydrogenase (LDH): Secondary | ICD-10-CM | POA: Insufficient documentation

## 2019-09-17 DIAGNOSIS — D649 Anemia, unspecified: Secondary | ICD-10-CM | POA: Diagnosis not present

## 2019-09-17 DIAGNOSIS — M3500 Sicca syndrome, unspecified: Secondary | ICD-10-CM | POA: Diagnosis not present

## 2019-09-17 DIAGNOSIS — R188 Other ascites: Secondary | ICD-10-CM | POA: Diagnosis not present

## 2019-09-17 DIAGNOSIS — Z885 Allergy status to narcotic agent status: Secondary | ICD-10-CM

## 2019-09-17 DIAGNOSIS — E872 Acidosis: Secondary | ICD-10-CM | POA: Diagnosis not present

## 2019-09-17 DIAGNOSIS — I110 Hypertensive heart disease with heart failure: Secondary | ICD-10-CM | POA: Diagnosis present

## 2019-09-17 DIAGNOSIS — R0902 Hypoxemia: Secondary | ICD-10-CM | POA: Diagnosis not present

## 2019-09-17 DIAGNOSIS — K219 Gastro-esophageal reflux disease without esophagitis: Secondary | ICD-10-CM | POA: Diagnosis present

## 2019-09-17 DIAGNOSIS — R2689 Other abnormalities of gait and mobility: Secondary | ICD-10-CM | POA: Diagnosis present

## 2019-09-17 DIAGNOSIS — I5032 Chronic diastolic (congestive) heart failure: Secondary | ICD-10-CM | POA: Diagnosis not present

## 2019-09-17 DIAGNOSIS — Z20828 Contact with and (suspected) exposure to other viral communicable diseases: Secondary | ICD-10-CM | POA: Diagnosis present

## 2019-09-17 DIAGNOSIS — E039 Hypothyroidism, unspecified: Secondary | ICD-10-CM | POA: Diagnosis present

## 2019-09-17 DIAGNOSIS — Z881 Allergy status to other antibiotic agents status: Secondary | ICD-10-CM | POA: Diagnosis not present

## 2019-09-17 DIAGNOSIS — Z888 Allergy status to other drugs, medicaments and biological substances status: Secondary | ICD-10-CM

## 2019-09-17 DIAGNOSIS — R0603 Acute respiratory distress: Secondary | ICD-10-CM | POA: Diagnosis not present

## 2019-09-17 DIAGNOSIS — N179 Acute kidney failure, unspecified: Secondary | ICD-10-CM | POA: Diagnosis not present

## 2019-09-17 DIAGNOSIS — K7460 Unspecified cirrhosis of liver: Secondary | ICD-10-CM | POA: Diagnosis present

## 2019-09-17 DIAGNOSIS — Z79890 Hormone replacement therapy (postmenopausal): Secondary | ICD-10-CM | POA: Diagnosis not present

## 2019-09-17 DIAGNOSIS — Z882 Allergy status to sulfonamides status: Secondary | ICD-10-CM

## 2019-09-17 DIAGNOSIS — R52 Pain, unspecified: Secondary | ICD-10-CM | POA: Diagnosis not present

## 2019-09-17 DIAGNOSIS — R069 Unspecified abnormalities of breathing: Secondary | ICD-10-CM | POA: Diagnosis not present

## 2019-09-17 DIAGNOSIS — R58 Hemorrhage, not elsewhere classified: Secondary | ICD-10-CM | POA: Diagnosis not present

## 2019-09-17 DIAGNOSIS — K766 Portal hypertension: Secondary | ICD-10-CM | POA: Diagnosis present

## 2019-09-17 DIAGNOSIS — R000 Tachycardia, unspecified: Secondary | ICD-10-CM | POA: Diagnosis not present

## 2019-09-17 DIAGNOSIS — Z79899 Other long term (current) drug therapy: Secondary | ICD-10-CM | POA: Diagnosis not present

## 2019-09-17 DIAGNOSIS — R140 Abdominal distension (gaseous): Secondary | ICD-10-CM | POA: Diagnosis not present

## 2019-09-17 DIAGNOSIS — R0602 Shortness of breath: Secondary | ICD-10-CM | POA: Diagnosis not present

## 2019-09-17 LAB — CBC WITH DIFFERENTIAL/PLATELET
Abs Immature Granulocytes: 0.06 10*3/uL (ref 0.00–0.07)
Basophils Absolute: 0 10*3/uL (ref 0.0–0.1)
Basophils Relative: 0 %
Eosinophils Absolute: 0 10*3/uL (ref 0.0–0.5)
Eosinophils Relative: 0 %
HCT: 22.9 % — ABNORMAL LOW (ref 36.0–46.0)
Hemoglobin: 6.6 g/dL — CL (ref 12.0–15.0)
Immature Granulocytes: 1 %
Lymphocytes Relative: 10 %
Lymphs Abs: 1 10*3/uL (ref 0.7–4.0)
MCH: 28.6 pg (ref 26.0–34.0)
MCHC: 28.8 g/dL — ABNORMAL LOW (ref 30.0–36.0)
MCV: 99.1 fL (ref 80.0–100.0)
Monocytes Absolute: 1 10*3/uL (ref 0.1–1.0)
Monocytes Relative: 10 %
Neutro Abs: 8 10*3/uL — ABNORMAL HIGH (ref 1.7–7.7)
Neutrophils Relative %: 79 %
Platelets: 426 10*3/uL — ABNORMAL HIGH (ref 150–400)
RBC: 2.31 MIL/uL — ABNORMAL LOW (ref 3.87–5.11)
RDW: 17.1 % — ABNORMAL HIGH (ref 11.5–15.5)
WBC: 10 10*3/uL (ref 4.0–10.5)
nRBC: 0.6 % — ABNORMAL HIGH (ref 0.0–0.2)

## 2019-09-17 LAB — LACTIC ACID, PLASMA
Lactic Acid, Venous: 6.7 mmol/L (ref 0.5–1.9)
Lactic Acid, Venous: 4.3 mmol/L (ref 0.5–1.9)

## 2019-09-17 LAB — COMPREHENSIVE METABOLIC PANEL
ALT: 29 U/L (ref 0–44)
AST: 50 U/L — ABNORMAL HIGH (ref 15–41)
Albumin: 2.8 g/dL — ABNORMAL LOW (ref 3.5–5.0)
Alkaline Phosphatase: 88 U/L (ref 38–126)
Anion gap: 16 — ABNORMAL HIGH (ref 5–15)
BUN: 22 mg/dL (ref 8–23)
CO2: 16 mmol/L — ABNORMAL LOW (ref 22–32)
Calcium: 8.5 mg/dL — ABNORMAL LOW (ref 8.9–10.3)
Chloride: 101 mmol/L (ref 98–111)
Creatinine, Ser: 0.99 mg/dL (ref 0.44–1.00)
GFR calc Af Amer: >60 mL/min (ref >60)
GFR calc non Af Amer: 52 mL/min — ABNORMAL LOW (ref >60)
Glucose, Bld: 132 mg/dL — ABNORMAL HIGH (ref 70–99)
Potassium: 4.7 mmol/L (ref 3.5–5.1)
Sodium: 133 mmol/L — ABNORMAL LOW (ref 135–145)
Total Bilirubin: 1.1 mg/dL (ref 0.3–1.2)
Total Protein: 6.2 g/dL — ABNORMAL LOW (ref 6.5–8.1)

## 2019-09-17 LAB — PREPARE RBC (CROSSMATCH)

## 2019-09-17 LAB — BRAIN NATRIURETIC PEPTIDE: B Natriuretic Peptide: 1760 pg/mL — ABNORMAL HIGH (ref 0.0–100.0)

## 2019-09-17 LAB — PROTIME-INR
INR: 1.4 — ABNORMAL HIGH (ref 0.8–1.2)
Prothrombin Time: 17.1 seconds — ABNORMAL HIGH (ref 11.4–15.2)

## 2019-09-17 LAB — PROCALCITONIN: Procalcitonin: 0.14 ng/mL

## 2019-09-17 LAB — POC SARS CORONAVIRUS 2 AG -  ED: SARS Coronavirus 2 Ag: NEGATIVE

## 2019-09-17 LAB — CBG MONITORING, ED: Glucose-Capillary: 76 mg/dL (ref 70–99)

## 2019-09-17 LAB — POC OCCULT BLOOD, ED: Fecal Occult Bld: NEGATIVE

## 2019-09-17 LAB — SARS CORONAVIRUS 2 BY RT PCR (HOSPITAL ORDER, PERFORMED IN ~~LOC~~ HOSPITAL LAB): SARS Coronavirus 2: NEGATIVE

## 2019-09-17 MED ORDER — PANTOPRAZOLE SODIUM 40 MG IV SOLR
40.0000 mg | INTRAVENOUS | Status: DC
  Administered 2019-09-18: 40 mg via INTRAVENOUS
  Filled 2019-09-17: qty 40

## 2019-09-17 MED ORDER — THYROID 30 MG PO TABS
120.0000 mg | ORAL_TABLET | Freq: Every day | ORAL | Status: DC
  Administered 2019-09-18: 120 mg via ORAL
  Filled 2019-09-17 (×3): qty 1

## 2019-09-17 MED ORDER — FERROUS SULFATE 325 (65 FE) MG PO TABS
325.0000 mg | ORAL_TABLET | Freq: Every day | ORAL | Status: DC
  Administered 2019-09-18: 325 mg via ORAL
  Filled 2019-09-17: qty 1

## 2019-09-17 MED ORDER — SODIUM CHLORIDE 0.9% FLUSH
3.0000 mL | Freq: Two times a day (BID) | INTRAVENOUS | Status: DC
  Administered 2019-09-18 (×2): 3 mL via INTRAVENOUS

## 2019-09-17 MED ORDER — ACETAMINOPHEN 325 MG PO TABS: 650.0000 mg | ORAL_TABLET | Freq: Four times a day (QID) | ORAL | Status: DC | PRN

## 2019-09-17 MED ORDER — SODIUM CHLORIDE 0.9 % IV SOLN
10.0000 mL/h | Freq: Once | INTRAVENOUS | Status: AC
  Administered 2019-09-18: 10 mL/h via INTRAVENOUS

## 2019-09-17 MED ORDER — ACETAMINOPHEN 650 MG RE SUPP: 650.0000 mg | Freq: Four times a day (QID) | RECTAL | Status: DC | PRN

## 2019-09-17 NOTE — H&P (Signed)
History and Physical    PLEASE NOTE THAT DRAGON DICTATION SOFTWARE WAS USED IN THE CONSTRUCTION OF THIS NOTE.   [name] PPI:[id number] DOB: July 06, 1933 DOA: 09/17/2019  PCP: Assunta Found, MD Patient coming from: Home  I have personally briefly reviewed patient's old medical records in [facility]  Chief Complaint: Shortness of breath  HPI: [name] is a 85 y.o. female with medical history significant for chronic diastolic heart failure, chronic iron deficiency anemia with baseline hemoglobin of approximately 10, recurrent lower gastrointestinal bleed, acquired hypothyroidism, Sjogren's disease, cirrhosis, who is admitted to [facility] on 09/17/2019 with acute on chronic anemia after presenting from home to [facility] emergency department complaining of shortness of breath.  The patient has been hospitalized multiple times over the last month for intermittent suspected lower gastrointestinal bleed, with recurrent blood transfusions over that time.  On 08/30/2019, the patient underwent EGD and colonoscopy, which reportedly showed evidence of diverticulosis without evidence of active bleeding.  On 09/06/2019, hemoglobin was noted to be 10.6, following which on 09/09/2019, the patient developed additional bright red blood per rectum, prompting presentation to [facility], at which time hemoglobin was noted to be 7.8.  At the time of presentation on 09/09/2019, the patient noted associated shortness of breath, which resolved following transfusion of 1 unit PRBC, with follow-up hemoglobin noted to be 8.6 upon discharge, which occurred on 09/10/2019.  During this most recent hospitalization, gastroenterology was consulted, and did not feel that SBP prophylaxis in the setting of underlying cirrhosis was warranted.  Given hemodynamic stability, appropriate improvement in hemoglobin following transfusion of 1 unit PRBC, and resolution of presenting shortness of breath  following interval transfusion, gastroenterology did not feel that repeat endoscopic evaluation was warranted, but rather felt that interval acute lower gastrointestinal bleed was likely on the basis of diverticular bleed given the result of diverticulosis observed on colonoscopy performed on 08/30/2019.  Subsequent to the aforementioned bright red blood per rectum on 09/09/2019, the patient denies any subsequent hematochezia, and also denies any recent melena.  Denies any recent abdominal pain, nausea, vomiting, or hematemesis. However, over the last 3 to 4 days, the patient reports mild, but progressive shortness of breath, similar to that which she was experiencing at the time of her presentation to [facility] emergency department on 09/09/2019.  She denies any associated subjective fever, chills, rigors, or generalized myalgias.  She also denies any associated headache, neck stiffness, sore throat, cough, or rash.  Denies any associated chest pain, diaphoresis, or palpitations.  Denies any recent traveling or known positive Covid exposures.  She denies any associated lower extremity edema, lower extremity erythema, or calf tenderness.  The patient confirms that she is on no blood thinning agents, including no aspirin as an outpatient.  She denies any history of alcohol consumption.  In the setting of progressive shortness of breath, the patient was brought to [facility] emergency department this evening via EMS for further evaluation.  Per EMS, initial oxygen saturation was noted to be in the low 70s, which improved to 100% on 6 L nasal cannula.   ED Course: Vital signs in the emergency department this evening were notable for the following: Temperature max 97.8, heart rate initially 104, which improved to 94 following initiation of PRBC transfusion, blood pressure ranged from 127/81-140 3/65; respiratory rate 20-24; initial oxygen saturation on 6 L nasal cannula was noted to be 100%.  Interval decrease  in supplemental oxygen from 6 L nasal  cannula to 3 L nasal cannula has been associated with oxygen saturations in the range of 98 to 100%.  Labs in the ED tonight were notable for the following: CMP notable for sodium 133, potassium 4.7, bicarbonate 16, anion gap 16, BUN 22, creatinine 0.99 relative to most recent prior creatinine data point of 0.68 on 09/12/2019, alkaline phosphatase 88, AST 50, ALT 29, and total bilirubin 1.1.  CBC notable for white blood cell count of 10,000, hemoglobin 6.6 relative to most recent prior hemoglobin value of 8.4 on 09/12/2019, platelets 426.  Initial lactic acid 6.7, with repeat value of 4.3.   Nasopharyngeal COVID-19 swab was performed in the ED, with results currently pending.  Blood cultures x2 were collected.  Chest x-ray was associated with suboptimal inspiratory effort, small left pleural effusion, and left basilar opacity favoring atelectasis in the setting of suboptimal inspiratory effort, but showed no evidence of pneumothorax or pulmonary edema.  Presenting EKG showed sinus tachycardia with heart rate 103, nonspecific intraventricular conduction delay with QRS 130 ms, septal/inferior Q waves, unchanged from most recent prior EKG, no evidence of interval T wave or ST changes.  Rectal exam revealed Hemoccult negative stool.  While still in the ED, the patient was typed and crossed for 2 units PRBC, with transfusion of such initiated.  Otherwise, the patient received no IV fluids or medications while in the ED.    Review of Systems: As per HPI otherwise 10 point review of systems negative.   Past Medical History:  Diagnosis Date   Cirrhosis (HCC)    GERD (gastroesophageal reflux disease)    GI bleed    Hypertension    Sjogren's disease (HCC)    Thyroid disease    hypothyroidism    Past Surgical History:  Procedure Laterality Date   COLONOSCOPY WITH ESOPHAGOGASTRODUODENOSCOPY (EGD) AND ESOPHAGEAL DILATION (ED)     years ago   COLONOSCOPY  WITH PROPOFOL N/A 08/30/2019   Procedure: COLONOSCOPY WITH PROPOFOL;  Surgeon: Malissa Hippoehman, Najeeb U, MD;  Location: [facility];  Service: Endoscopy;  Laterality: N/A;   complete hysterectomy     ESOPHAGOGASTRODUODENOSCOPY (EGD) WITH PROPOFOL N/A 08/30/2019   Procedure: ESOPHAGOGASTRODUODENOSCOPY (EGD) WITH PROPOFOL;  Surgeon: Malissa Hippoehman, Najeeb U, MD;  Location: [facility];  Service: Endoscopy;  Laterality: N/A;   left rotator cuff      Social History:  reports that she has never smoked. She has never used smokeless tobacco. She reports that she does not drink alcohol or use drugs.   Allergies  Allergen Reactions   Ciprofloxacin Other (See Comments)    NO REACTION LISTED   Ciprocin-Fluocin-Procin [Fluocinolone Acetonide]    Codeine     Unknown ( per daughter)   Demerol     Unknown ( per daughter)   Penicillins     Did it involve swelling of the face/tongue/throat, SOB, or low BP? Unknown Did it involve sudden or severe rash/hives, skin peeling, or any reaction on the inside of your mouth or nose? Unknown Did you need to seek medical attention at a hospital or doctor's office? Yes When did it last happen?Uknown If all above answers are "NO", may proceed with cephalosporin use. No issue with ceftriaxone   Sulfa Antibiotics     Unknown ( per daughter)    Family History  Problem Relation Age of Onset   Colon cancer Neg Hx    Colon polyps Neg Hx      Prior to Admission medications   Medication Sig Start Date End Date  Taking? Authorizing Provider  Calcium-Magnesium-Zinc [phone] MG TABS Take 1 tablet by mouth every morning.    Yes [provider]  Cholecalciferol (VITAMIN D3) 10 MCG (400 UNIT) CAPS Take 1,000 mg by mouth daily.    Yes [provider]  co-enzyme Q-10 30 MG capsule Take 30 mg by mouth daily.    Yes [provider]  esomeprazole (NEXIUM) 20 MG capsule Take 1 capsule (20 mg total) by mouth at bedtime. 09/01/19  Yes Erick Blinks, MD  Ferrous Sulfate (IRON) 325 (65 Fe) MG TABS Take 1 tablet (325 mg total) by mouth daily at 2 PM. 09/01/19  Yes Memon, Durward Mallard, MD  loperamide (IMODIUM) 2 MG capsule Take 2 mg by mouth daily as needed for diarrhea or loose stools.    Yes [provider]  Multiple Vitamin (MULTIVITAMIN WITH MINERALS) TABS tablet Take 1 tablet by mouth every morning.   Yes [provider]  Omega-3 Fatty Acids (FISH OIL PO) Take 1 capsule by mouth daily.   Yes [provider]  sertraline (ZOLOFT) 25 MG tablet Take 25 mg by mouth every morning.    Yes [provider]  thyroid (ARMOUR) 120 MG tablet Take 120 mg by mouth daily before breakfast.   Yes [provider]  Turmeric 400 MG CAPS Take 400 mg by mouth every morning.    Yes [provider]  vitamin B-12 (CYANOCOBALAMIN) 250 MCG tablet Take 250 mcg by mouth every morning.   Yes [provider]  Wheat Dextrin (BENEFIBER DRINK MIX PO) Take 1 packet by mouth daily. With breakfast   Yes [provider]  thyroid 146.25 MG TABS Take 146.25 mg by mouth daily before breakfast. Patient not taking: Reported on 09/17/2019 09/11/19   Shon Hale, MD     Objective    Physical Exam: Vitals:   09/17/19 1706 09/17/19 1730 09/17/19 1800 09/17/19 1830  BP: 127/81 132/81 130/60 (!) 137/57  Pulse: (!) 102 95 90 93  Resp: (!) 25 (!) 22 20 (!) 23  Temp:      TempSrc:      SpO2: 100% 100% 100% 100%  Weight:        General: appears to be stated age; alert, oriented Skin: warm, dry, no rash Head:  AT/[place] Eyes:  PEARL b/l, EOMI Mouth:  Oral mucosa membranes appear dry, normal dentition Neck: supple; trachea midline Heart:  RRR; did not appreciate any M/R/G Lungs: Diminished bibasilar breath sounds, otherwise, CTAB; did not appreciate any wheezes, rales, or rhonchi Abdomen: + BS; soft, ND, NT Vascular: 2+ pedal pulses b/l; 2+ radial pulses b/l Extremities: no peripheral edema, no  muscle wasting   Labs on Admission: I have personally reviewed following labs and imaging studies  CBC: Recent Labs  Lab 09/12/19 1735 09/17/19 1648  WBC 6.0 10.0  NEUTROABS 4.2 8.0*  HGB 8.4* 6.6*  HCT 28.2* 22.9*  MCV 98.3 99.1  PLT 191 426*   Basic Metabolic Panel: Recent Labs  Lab 09/12/19 1735 09/17/19 1648  NA 137 133*  K 4.1 4.7  CL 107 101  CO2 21* 16*  GLUCOSE 121* 132*  BUN 10 22  CREATININE 0.68 0.99  CALCIUM 8.3* 8.5*   GFR: Estimated Creatinine Clearance: 31.9 mL/min (by C-G formula based on SCr of 0.99 mg/dL). Liver Function Tests: Recent Labs  Lab 09/17/19 1648  AST 50*  ALT 29  ALKPHOS 88  BILITOT 1.1  PROT 6.2*  ALBUMIN 2.8*   No results for input(s): LIPASE, AMYLASE  in the last 168 hours. No results for input(s): AMMONIA in the last 168 hours. Coagulation Profile: No results for input(s): INR, PROTIME in the last 168 hours. Cardiac Enzymes: No results for input(s): CKTOTAL, CKMB, CKMBINDEX, TROPONINI in the last 168 hours. BNP (last 3 results) No results for input(s): PROBNP in the last 8760 hours. HbA1C: No results for input(s): HGBA1C in the last 72 hours. CBG: Recent Labs  Lab 09/12/19 1629 09/17/19 1622  GLUCAP 105* 76   Lipid Profile: No results for input(s): CHOL, HDL, LDLCALC, TRIG, CHOLHDL, LDLDIRECT in the last 72 hours. Thyroid Function Tests: No results for input(s): TSH, T4TOTAL, FREET4, T3FREE, THYROIDAB in the last 72 hours. Anemia Panel: No results for input(s): VITAMINB12, FOLATE, FERRITIN, TIBC, IRON, RETICCTPCT in the last 72 hours. Urine analysis: No results found for: COLORURINE, APPEARANCEUR, LABSPEC, PHURINE, GLUCOSEU, HGBUR, BILIRUBINUR, KETONESUR, PROTEINUR, UROBILINOGEN, NITRITE, LEUKOCYTESUR  Radiological Exams on Admission: Dg Chest Portable 1 View  Result Date: 09/17/2019 CLINICAL DATA:  85 year old female with shortness of breath. EXAM: PORTABLE CHEST 1 VIEW COMPARISON:  Chest radiograph dated  08/27/2019. FINDINGS: Shallow inspiration. There is a small left pleural effusion with left lung base atelectasis versus infiltrate. Mild diffuse interstitial prominence may be related to atelectatic changes. There is no pneumothorax. Stable cardiomegaly. Atherosclerotic calcification of the aortic arch. No acute osseous pathology. IMPRESSION: 1. Small left pleural effusion with left lung base atelectasis versus infiltrate. 2. Stable cardiomegaly. Electronically Signed   By: Elgie Collard M.D.   On: 09/17/2019 17:22    EKG: Independently reviewed, with result as described above.   Assessment/Plan   GENEAL HUEBERT is a 85 y.o. female with medical history significant for chronic diastolic heart failure, chronic iron deficiency anemia with baseline hemoglobin of approximately 10, recurrent lower gastrointestinal bleed, acquired hypothyroidism, Sjogren's disease, cirrhosis, who is admitted to [facility] on 09/17/2019 with acute on chronic anemia after presenting from home to [facility] emergency department complaining of shortness of breath.   Principal Problem:   Acute on chronic anemia Active Problems:   Cirrhosis of liver with ascites and portal hypertension   Hypothyroidism   Elevated lactic acid level   AKI (acute kidney injury) (HCC)   #) Acute on chronic anemia: In the setting of chronic anemia with baseline hemoglobin of approximately 10 that is likely multifactorial with contributions from chronic iron deficiency anemia due to suspected recurrent lower gastrointestinal bleed in addition to contribution from underlying cirrhosis, the patient presents this evening with hemoglobin of 6.6, which is down from most recent prior value of 8.4 on 09/12/2019.  Mildly symptomatic at presentation, with report of progressive shortness of breath over the last 3 to 4 days, is improved following initiation of blood transfusion and emergency department, as further described above.  Initial vital  signs were notable for mild tachycardia, which is also resolved following initiation of PRBC transfusion this evening.  No evidence of associated hypotension.  The patient denies any hematochezia or melena since 09/09/2019, while rectal exam performed in the ED this evening revealed Hemoccult stool negative finding.  Differential includes recurrence of previously suspected slow, intermittent lower gastrointestinal bleed, suspect to be on the basis of diverticular bleed given colonoscopy on 08/30/2019 revealing evidence of diverticulosis in the absence of active bleed at that time.  Plan: We will continue to transfuse the 2 units PRBC that were initiated in the ED this evening, monitoring closely for development of volume overload in the setting of a history of chronic diastolic  heart failure.  Following completion of transfusion of second unit PRBC, will repeat hemoglobin, with plan for further transfusion if hemoglobin remains less than 8.0 at that time.  Monitor on telemetry.  Add on INR to labs already collected in the ED.  I have ordered every 4 hours H&H's overnight, with the final such order to occur at 10 AM on 09/18/2019.  We will also recheck INR in the morning.  Monitor strict I's and O's.  Close monitoring of ensuing vital signs.  N.p.o. anticipate that patient's case will be further discussed with gastroenterology in the morning.  Per previously documented discussion with gastroenterology, SBP prophylaxis was felt felt to be indicated.    #) Acute hypoxic respiratory distress: While EMS reports that the patient's oxygen saturation was initially noted to be in the low 70s on room air, all of the patient's oxygen saturations since presenting to the emergency department have been in the range of high 90s to 100% with diminishing FiO2.  This may have potentially been on the basis of compensatory mild tachycardia as compensatory mechanism to increase oxygen delivery capacity in the setting of acute on  chronic anemia, creating hypoventilatory process of the distal aspects of the bilateral lungs, which is consistent with radiographic appearance of presenting chest x-ray which showed suboptimal respiratory effort is suspected left basilar atelectasis.  No evidence of overt infiltrate or edema on presenting chest x-ray, and, clinically, presentation is less suggestive of underlying pulmonary infection.  However, will check procalcitonin level to further evaluate for this possibility, and follow for result of nasopharyngeal COVID-19 test which was performed in the ED this evening.  While the patient does have a history of chronic diastolic heart failure, clinical presentation is less suggestive of acute decompensation of this pathology.  Additionally, presenting EKG shows no evidence of acute ischemic changes.  Plan: Work-up and management of presenting acute on chronic anemia, as further described above.  Monitor continuous pulse oximetry.  Check procalcitonin, as above.  We will also check BNP.  Monitor strict I's and O's and daily weights.  Will monitor for result of nasopharyngeal COVID-19 test performed in the ED this evening.     #) Lactic acidosis: Presenting lactic acid level found to be elevated at 6.7, which is subsequently decreased to 4.3 following interval initiation of PRBC transfusion, as above.  Elevated lactic acid level is likely multifactorial in etiology, with contributions from relative tissue hypoperfusion in the setting of diminished oxygen carrying capacity secondary to acute on chronic anemia as well as hypoxia, as above.  Additional noninfectious contributions to presenting elevated lactic acid level include known underlying cirrhosis as well as presenting acute kidney injury.  Overall, underlying infectious process is felt to be less likely at the present time.  In the absence of suspected underlying infectious process as well as in the absence of objective fever or leukocytosis, the  patient is not considered septic at this time, and, consequently, does not warrant a 30 mL/kg IVF bolus.  Plan: Work-up and management of presenting acute on chronic anemia, including PRBC transfusion, as above.  Will repeat lactic acid level again in the morning.  Work-up and management of presenting acute kidney injury, as further described below.  Add on INR to labs collected in the ED as component of monitoring of underlying cirrhosis.  Monitor for development of fever.  Repeat CBC with differential in the morning.     #) Acute kidney injury: Presenting serum creatinine noted to be 0.99, which is  increased from most recent prior creatinine data point of 0.68 when checked on 09/12/2019.  Suspected to be prerenal in nature on the basis of diminished renal perfusion secondary to intravascular depletion as consequence of acute on chronic anemia as well as associated diminished oxygen carrying capacity, as further described above.   Plan: Check urinalysis, with attention for the presence of urinary cast.  We will also check random urine sodium as well as random urine creatinine.  Monitor strict I's and O's and daily weights.  Attempt to avoid nephrotoxic agents.  Will repeat CMP in the morning.  Work-up and management of presenting acute on chronic anemia, as further described above.     #) Chronic diastolic heart failure: Most recent echocardiogram, which was performed on 08/28/2019 showed moderate LVH, LVEF 50 to 57%, grade 2 diastolic dysfunction, severely dilated left atrium, moderately dilated right atrium.  No evidence of acutely decompensated heart failure at this time, although we will need to monitor closely for ensuing development of evidence of volume overload given plan for blood product transfusion, as further described above.  Plan: Monitor strict I's and O's and daily weights.  Repeat CMP in the morning.  We will also check BNP, as above.     #) Acquired hypothyroidism: The patient  as well as her daughter are very clear and conveying to me that the patient is to be on Armour Thyroid supplementation at a dose of 120 mg every morning, administered at least 1 hour prior to breakfast.   Plan: Continue home Armour Thyroid supplementation, as described above.     #) Cirrhosis: The patient carries a diagnosis of cirrhosis, in the absence of any history of alcohol abuse.  Specific underlying etiology is not currently clear to me.  I am unable to calculate a meld score at this time given the absence of INR value.  The patient is not currently on Lasix or spironolactone.  Plan: Add on INR to labs already collected in the ED.  Will repeat CMP in the morning as well as INR at that time.  Monitor strict I's and O's and daily weights.    DVT prophylaxis: SCDs Code Status: For now the patient wishes to remain full code, although she would like to engage in further [place] discussions in the morning after she has had the opportunity to further discuss this matter with her daughter.  Family Communication: Patient's case was discussed with her daughter Henrietta Dine), who is present at bedside. Disposition Plan:  Per Rounding Team Consults called: None Admission status: Observation; med telemetry   PLEASE NOTE THAT DRAGON DICTATION SOFTWARE WAS USED IN THE CONSTRUCTION OF THIS NOTE.   [place] [facility] Pager [phone] From 3PM- 11PM.   Otherwise, please contact night-coverage  [email or web address] Password [facility]  09/17/2019, 6:55 PM

## 2019-09-17 NOTE — ED Notes (Signed)
CRITICAL VALUE ALERT  Critical Value:  Hg 6.6  Date & Time Notied:  09/17/2019 1722  Provider Notified: Caitlyn PA   Orders Received/Actions taken: Blood Ordered

## 2019-09-17 NOTE — ED Triage Notes (Signed)
Pt has been having a decline in respiratory status for the last few days. Per daughter, pt has been eating and drinking well, but has been very SOB. Pt 98% on [place], but 70% on RA.

## 2019-09-17 NOTE — ED Provider Notes (Signed)
[facility] Provider Note   CSN: [id number] Arrival date & time: 09/17/19  1555     History   Chief Complaint Chief Complaint  Patient presents with  . Respiratory Distress    HPI [name] is a 85 y.o. female.     HPI   85 year old female, with a PMH of GI bleeding, HTN, cirrhosis, Sjogren's disease, presents with worsening shortness of breath for the last 3 to 5 days.  Per patient she has just not been feeling well for the last several days.  She notes shortness of breath and fatigue.  Patient states that she has been eating and drinking but per family she has not been eating and drinking as well.  Patient was 70% on room air by EMS.  Patient resting comfortably on 6 L nasal cannula in the room.  She denies any fevers, chills, cough, sputum production, sore throat.  She denies any chest pain, nausea, vomiting, abdominal pain.  She does have a history of recent rectal bleeding on Thanksgiving but states she has not had any rectal bleeding since then.  She denies any melena or bright red blood per rectum.Marland Kitchen.    Past Medical History:  Diagnosis Date  . Cirrhosis (HCC)   . GERD (gastroesophageal reflux disease)   . GI bleed   . Hypertension   . Sjogren's disease (HCC)   . Thyroid disease    hypothyroidism    Patient Active Problem List   Diagnosis Date Noted  . GI hemorrhage 09/09/2019  . Acute blood loss anemia 09/01/2019  . Lower GI bleed 09/01/2019  . Syncope 09/01/2019  . Hypothyroidism 09/01/2019  . Hypokalemia 09/01/2019  . Chronic diarrhea 09/01/2019  . Symptomatic anemia 08/27/2019  . Portal hypertension --- liver cirrhosis 08/27/2019  . Cirrhosis of liver with ascites and portal hypertension 08/27/2019  . Diarrhea 04/11/2019    Past Surgical History:  Procedure Laterality Date  . COLONOSCOPY WITH ESOPHAGOGASTRODUODENOSCOPY (EGD) AND ESOPHAGEAL DILATION (ED)     years ago  . COLONOSCOPY WITH PROPOFOL N/A 08/30/2019   Procedure:  COLONOSCOPY WITH PROPOFOL;  Surgeon: Malissa Hippoehman, Najeeb U, MD;  [facility];  Service: Endoscopy;  Laterality: N/A;  . complete hysterectomy    . ESOPHAGOGASTRODUODENOSCOPY (EGD) WITH PROPOFOL N/A 08/30/2019   Procedure: ESOPHAGOGASTRODUODENOSCOPY (EGD) WITH PROPOFOL;  Surgeon: Malissa Hippoehman, Najeeb U, MD;  Location: [facility];  Service: Endoscopy;  Laterality: N/A;  . left rotator cuff       OB History   No obstetric history on file.      Home Medications    Prior to Admission medications   Medication Sig Start Date End Date Taking? Authorizing Provider  Calcium-Magnesium-Zinc [phone] MG TABS Take 1 tablet by mouth every morning.    Yes [provider]  Cholecalciferol (VITAMIN D3) 10 MCG (400 UNIT) CAPS Take 1,000 mg by mouth daily.    Yes [provider]  co-enzyme Q-10 30 MG capsule Take 30 mg by mouth daily.    Yes [provider]  esomeprazole (NEXIUM) 20 MG capsule Take 1 capsule (20 mg total) by mouth at bedtime. 09/01/19  Yes Erick BlinksMemon, Jehanzeb, MD  Ferrous Sulfate (IRON) 325 (65 Fe) MG TABS Take 1 tablet (325 mg total) by mouth daily at 2 PM. 09/01/19  Yes Memon, Durward MallardJehanzeb, MD  loperamide (IMODIUM) 2 MG capsule Take 2 mg by mouth daily as needed for diarrhea or loose stools.    Yes [provider]  Multiple Vitamin (MULTIVITAMIN WITH  MINERALS) TABS tablet Take 1 tablet by mouth every morning.   Yes [provider]  Omega-3 Fatty Acids (FISH OIL PO) Take 1 capsule by mouth daily.   Yes [provider]  sertraline (ZOLOFT) 25 MG tablet Take 25 mg by mouth every morning.    Yes [provider]  thyroid (ARMOUR) 120 MG tablet Take 120 mg by mouth daily before breakfast.   Yes [provider]  Turmeric 400 MG CAPS Take 400 mg by mouth every morning.    Yes [provider]  vitamin B-12 (CYANOCOBALAMIN) 250 MCG tablet Take 250 mcg by mouth every morning.   Yes [provider]  Wheat Dextrin  (BENEFIBER DRINK MIX PO) Take 1 packet by mouth daily. With breakfast   Yes [provider]  thyroid 146.25 MG TABS Take 146.25 mg by mouth daily before breakfast. Patient not taking: Reported on 09/17/2019 09/11/19   Roxan Hockey, MD    Family History Family History  Problem Relation Age of Onset  . Colon cancer Neg Hx   . Colon polyps Neg Hx     Social History Social History   Tobacco Use  . Smoking status: Never Smoker  . Smokeless tobacco: Never Used  Substance Use Topics  . Alcohol use: Never    Frequency: Never  . Drug use: Never     Allergies   Ciprofloxacin, Ciprocin-fluocin-procin [fluocinolone acetonide], Codeine, Demerol, Penicillins, and Sulfa antibiotics   Review of Systems Review of Systems  Constitutional: Negative for chills and fever.  HENT: Negative for rhinorrhea and sore throat.   Respiratory: Positive for shortness of breath. Negative for cough.   Cardiovascular: Negative for chest pain and leg swelling.  Gastrointestinal: Negative for abdominal pain, blood in stool, diarrhea, nausea and vomiting.  Genitourinary: Negative for dysuria, frequency and urgency.  Skin: Negative for rash and wound.  All other systems reviewed and are negative.    Physical Exam Updated Vital Signs BP (!) 143/65 (BP Location: Left Arm)   Pulse (!) 104   Temp 97.8 F (36.6 C) (Oral)   Wt 54.2 kg   SpO2 94%   BMI 22.58 kg/m   Physical Exam Vitals signs and nursing note reviewed.  HENT:     Head: Normocephalic and atraumatic.  Neck:     Musculoskeletal: Neck supple.  Cardiovascular:     Rate and Rhythm: Regular rhythm. Tachycardia present.     Heart sounds: Normal heart sounds. No murmur.  Pulmonary:     Effort: Tachypnea present. No respiratory distress.     Breath sounds: Examination of the right-lower field reveals rales. Examination of the left-lower field reveals rales. Rales present. No wheezing.  Abdominal:     General: Bowel sounds are  normal. There is no distension.     Palpations: Abdomen is soft.     Tenderness: There is no abdominal tenderness.  Musculoskeletal: Normal range of motion.        General: No tenderness or deformity.  Skin:    General: Skin is warm and dry.     Findings: No erythema or rash.  Neurological:     Mental Status: She is alert and oriented to person, place, and time.  Psychiatric:        Behavior: Behavior normal.      ED Treatments / Results  Labs (all labs ordered are listed, but only abnormal results are displayed) Labs Reviewed  URINE CULTURE  CULTURE, BLOOD (ROUTINE X 2)  BLOOD GAS, ARTERIAL  CBC WITH  DIFFERENTIAL/PLATELET  COMPREHENSIVE METABOLIC PANEL  URINALYSIS, ROUTINE W REFLEX MICROSCOPIC  LACTIC ACID, PLASMA  LACTIC ACID, PLASMA  CBG MONITORING, ED  TYPE AND SCREEN    EKG None  Radiology No results found.  Procedures .Critical Care Performed by: Clayborne Artist, PA-C Authorized by: Clayborne Artist, PA-C   Critical care provider statement:    Critical care time (minutes):  45   Critical care was necessary to treat or prevent imminent or life-threatening deterioration of the following conditions:  Respiratory failure, cardiac failure, circulatory failure, shock and dehydration   Critical care was time spent personally by me on the following activities:  Discussions with consultants, evaluation of patient's response to treatment, examination of patient, ordering and performing treatments and interventions, ordering and review of laboratory studies, ordering and review of radiographic studies, pulse oximetry, re-evaluation of patient's condition, obtaining history from patient or surrogate and review of old charts   (including critical care time)  Medications Ordered in ED Medications - No data to display   Initial Impression / Assessment and Plan / ED Course  I have reviewed the triage vital signs and the nursing notes.  Pertinent labs & imaging  results that were available during my care of the patient were reviewed by me and considered in my medical decision making (see chart for details).       The patient is noted to have a lactate>4. With the current information available to me, I don't think the patient is in septic shock. The lactate>4, is related to OTHER SHOCK Hemoglobin 6.6.  Patient presented with SOB.  On arrival by EMS she was satting in the 70s and placed on oxygen.  Patient has been on 6 L in the ED and resting comfortably.  Otherwise vitals show that patient is slightly tachycardic and slightly tachypneic.  Patient afebrile checked rectally.  She did meet SIRS criteria so blood cultures and lactic were drawn.  However I do not feel that her symptoms are infectious etiology.  Her physical exam showed rales in bilateral bases and patient is pale.  Abdomen is soft and nontender palpation on multiple evaluations.  Her blood work shows a hemoglobin of 6.6.  Her lactic also noted to be elevated at 6.7.  Otherwise her blood work shows a normal white count, no significant electrolyte abnormalities.  Her kidney function and LFTs are normal.  Her BUN is normal.  She is Hemoccult negative.  2 units of blood ordered in the ED.  I discussed findings with patient and daughter at bedside.  Patient will need admission for further work-up.  Per chart review patient has been seen by GI who recommended a possible bleeding study if patient rebleeds.  I discussed with the hospitalist, Dr. Arlean Hopping who agreed to accept the patient.  Final Clinical Impressions(s) / ED Diagnoses   Final diagnoses:  None    ED Discharge Orders    None       Rueben Bash 09/17/19 2326    Mancel Bale, MD 09/17/19 [phone]

## 2019-09-17 NOTE — ED Notes (Signed)
Have decreased patient's O2 to 4L

## 2019-09-17 NOTE — ED Provider Notes (Signed)
  Face-to-face evaluation   History: She presents for evaluation of difficulty breathing.  She states she has not had a rectal bleeding for several days.  She previously had rectal bleeding requiring transfusion, several times last month.  She denies fever, cough, focal weakness or paresthesia.  Physical exam: Examined at 4:30 PM.  Frail, elderly female who is alert and conversant.  No respiratory distress.  She is currently on oxygen and states that made her feel better.  She is lucid.  No focal asymmetry.  Medical screening examination/treatment/procedure(s) were conducted as a shared visit with non-physician practitioner(s) and myself.  I personally evaluated the patient during the encounter    Daleen Bo, MD 09/17/19 [phone]

## 2019-09-17 NOTE — ED Notes (Signed)
CRITICAL VALUE ALERT  Critical Value:  Lactic 6.7  Date & Time Notied:  09/17/2019 1818  Provider Notified: Caitlyn PA    Orders Received/Actions taken: None yet

## 2019-09-17 NOTE — ED Notes (Signed)
Ph 7.48  CO2 21.2 SO2 99.9

## 2019-09-18 DIAGNOSIS — N179 Acute kidney failure, unspecified: Secondary | ICD-10-CM | POA: Diagnosis not present

## 2019-09-18 DIAGNOSIS — R0602 Shortness of breath: Secondary | ICD-10-CM | POA: Diagnosis not present

## 2019-09-18 DIAGNOSIS — D649 Anemia, unspecified: Secondary | ICD-10-CM | POA: Diagnosis not present

## 2019-09-18 DIAGNOSIS — K766 Portal hypertension: Secondary | ICD-10-CM | POA: Diagnosis not present

## 2019-09-18 DIAGNOSIS — Z20828 Contact with and (suspected) exposure to other viral communicable diseases: Secondary | ICD-10-CM | POA: Diagnosis not present

## 2019-09-18 DIAGNOSIS — D509 Iron deficiency anemia, unspecified: Secondary | ICD-10-CM | POA: Diagnosis not present

## 2019-09-18 DIAGNOSIS — R188 Other ascites: Secondary | ICD-10-CM | POA: Diagnosis not present

## 2019-09-18 DIAGNOSIS — I5032 Chronic diastolic (congestive) heart failure: Secondary | ICD-10-CM | POA: Diagnosis not present

## 2019-09-18 LAB — COMPREHENSIVE METABOLIC PANEL
ALT: 25 U/L (ref 0–44)
AST: 37 U/L (ref 15–41)
Albumin: 2.5 g/dL — ABNORMAL LOW (ref 3.5–5.0)
Alkaline Phosphatase: 77 U/L (ref 38–126)
Anion gap: 11 (ref 5–15)
BUN: 25 mg/dL — ABNORMAL HIGH (ref 8–23)
CO2: 21 mmol/L — ABNORMAL LOW (ref 22–32)
Calcium: 8.5 mg/dL — ABNORMAL LOW (ref 8.9–10.3)
Chloride: 104 mmol/L (ref 98–111)
Creatinine, Ser: 0.85 mg/dL (ref 0.44–1.00)
GFR calc Af Amer: >60 mL/min (ref >60)
GFR calc non Af Amer: >60 mL/min (ref >60)
Glucose, Bld: 86 mg/dL (ref 70–99)
Potassium: 4.7 mmol/L (ref 3.5–5.1)
Sodium: 136 mmol/L (ref 135–145)
Total Bilirubin: 1.8 mg/dL — ABNORMAL HIGH (ref 0.3–1.2)
Total Protein: 5.4 g/dL — ABNORMAL LOW (ref 6.5–8.1)

## 2019-09-18 LAB — CBC
HCT: 30.7 % — ABNORMAL LOW (ref 36.0–46.0)
Hemoglobin: 9.7 g/dL — ABNORMAL LOW (ref 12.0–15.0)
MCH: 29 pg (ref 26.0–34.0)
MCHC: 31.6 g/dL (ref 30.0–36.0)
MCV: 91.6 fL (ref 80.0–100.0)
Platelets: 275 10*3/uL (ref 150–400)
RBC: 3.35 MIL/uL — ABNORMAL LOW (ref 3.87–5.11)
RDW: 15.4 % (ref 11.5–15.5)
WBC: 10.2 10*3/uL (ref 4.0–10.5)
nRBC: 0.7 % — ABNORMAL HIGH (ref 0.0–0.2)

## 2019-09-18 LAB — HEMOGLOBIN AND HEMATOCRIT, BLOOD
HCT: 25.7 % — ABNORMAL LOW (ref 36.0–46.0)
HCT: 30.7 % — ABNORMAL LOW (ref 36.0–46.0)
Hemoglobin: 7.6 g/dL — ABNORMAL LOW (ref 12.0–15.0)
Hemoglobin: 9.9 g/dL — ABNORMAL LOW (ref 12.0–15.0)

## 2019-09-18 LAB — LACTIC ACID, PLASMA: Lactic Acid, Venous: 1.5 mmol/L (ref 0.5–1.9)

## 2019-09-18 LAB — PROTIME-INR
INR: 1.4 — ABNORMAL HIGH (ref 0.8–1.2)
Prothrombin Time: 17.4 seconds — ABNORMAL HIGH (ref 11.4–15.2)

## 2019-09-18 LAB — MAGNESIUM: Magnesium: 2.1 mg/dL (ref 1.7–2.4)

## 2019-09-18 LAB — SARS CORONAVIRUS 2 (TAT 6-24 HRS): SARS Coronavirus 2: NEGATIVE

## 2019-09-18 MED ORDER — PANTOPRAZOLE SODIUM 40 MG PO TBEC: 40.0000 mg | DELAYED_RELEASE_TABLET | Freq: Two times a day (BID) | ORAL | Status: DC

## 2019-09-18 MED ORDER — IRON 325 (65 FE) MG PO TABS: 1.0000 | ORAL_TABLET | Freq: Two times a day (BID) | ORAL | 0 refills | Status: AC

## 2019-09-18 NOTE — Evaluation (Signed)
Physical Therapy Evaluation Patient Details Name: [name] MRN: [id number] DOB: 1933/06/10 Today's Date: 09/18/2019   History of Present Illness  [name] is a 85 y.o. female with medical history significant for chronic diastolic heart failure, chronic iron deficiency anemia with baseline hemoglobin of approximately 10, recurrent lower gastrointestinal bleed, acquired hypothyroidism, Sjogren's disease, cirrhosis, who is admitted to [facility] on 09/17/2019 with acute on chronic anemia after presenting from home to [facility] emergency department complaining of shortness of breath. The patient has been hospitalized multiple times over the last month for intermittent suspected lower gastrointestinal bleed, with recurrent blood transfusions over that time.  On 08/30/2019, the patient underwent EGD and colonoscopy, which reportedly showed evidence of diverticulosis without evidence of active bleeding.  On 09/06/2019, hemoglobin was noted to be 10.6, following which on 09/09/2019, the patient developed additional bright red blood per rectum, prompting presentation to [facility], at which time hemoglobin was noted to be 7.8.  At the time of presentation on 09/09/2019, the patient noted associated shortness of breath, which resolved following transfusion of 1 unit PRBC, with follow-up hemoglobin noted to be 8.6 upon discharge, which occurred on 09/10/2019.  During this most recent hospitalization, gastroenterology was consulted, and did not feel that SBP prophylaxis in the setting of underlying cirrhosis was warranted.  Given hemodynamic stability, appropriate improvement in hemoglobin following transfusion of 1 unit PRBC, and resolution of presenting shortness of breath following interval transfusion, gastroenterology did not feel that repeat endoscopic evaluation was warranted, but rather felt that interval acute lower gastrointestinal bleed was likely on the basis of diverticular  bleed given the result of diverticulosis observed on colonoscopy performed on 08/30/2019.    Clinical Impression  Physical therapy evaluation and gait training completed, patient is at slightly below baseline and no further PT services recommended at this time. Pt currently on 2LPM O2, O2 sat 90% supine in bed. Pt's O2 sat decreased to 74% with ambulation and marching in place, but pulse reading decreased to 34bpm and pt's pulse >60 bpm per manual check by therapist so unable to rely on O2 sat reading due to possible movement of pulse ox on pt's finger with mobility. Pt returned to sitting, O2 increased to 4LPM O2 and pulse increased to 101bpm and O2 sat 100% with good pulse reading. Therapist decreased supplemental O2 to 2LPM O2 and pt's O2 sat maintained 100% with pulse decreasing to 93bpm and continued good pulse reading. Nurse notified of pt's O2 reading with ambulation and questionable reading due to movement. Pt reports mild SOB, denies dizziness or nausea with mobility. Pt's daughter in room, answered all questions and recommending HHPT upon discharge for strengthening and increasing activity tolerance. Patient discharged to care of nursing for ambulation daily as tolerated for length of stay.     Follow Up Recommendations Home health PT;Supervision - Intermittent    Equipment Recommendations  None recommended by PT    Recommendations for Other Services       Precautions / Restrictions Precautions Precautions: None Restrictions Weight Bearing Restrictions: No      Mobility  Bed Mobility Overal bed mobility: Modified Independent        General bed mobility comments: supine<>sit requires increased time and occasional bedrail use  Transfers Overall transfer level: Modified independent Equipment used: None      General transfer comment: initial bracing back of legs on bed, no unsteadiness noted  Ambulation/Gait Ambulation/Gait assistance: Modified independent (Device/Increase  time);Supervision Gait Distance (Feet): 20  Feet Assistive device: None Gait Pattern/deviations: Step-through pattern     General Gait Details: mod independent with straight line gait, SUPV with turns due to increased unsteadiness and managing O2 line  Stairs     Wheelchair Mobility    Modified Rankin (Stroke Patients Only)       Balance      Pertinent Vitals/Pain Pain Assessment: No/denies pain    Home Living Family/patient expects to be discharged to:: Private residence Living Arrangements: Alone Available Help at Discharge: Family;Available 24 hours/day(3 children are currently rotating to stay with pt 24/7) Type of Home: House Home Access: Stairs to enter Entrance Stairs-Rails: None Entrance Stairs-Number of Steps: 1 at backdoor, 2 at frontdoor Home Layout: One level Home Equipment: None      Prior Function Level of Independence: Independent         Comments: Pt reports independent household and community ambulator without AD, independently driving. Pt reports children have been taking turns staying with her to assist since hospitalization last month.     Hand Dominance        Extremity/Trunk Assessment   Upper Extremity Assessment Upper Extremity Assessment: Overall WFL for tasks assessed    Lower Extremity Assessment Lower Extremity Assessment: Overall WFL for tasks assessed    Cervical / Trunk Assessment Cervical / Trunk Assessment: Kyphotic  Communication   Communication: No difficulties  Cognition Arousal/Alertness: Awake/alert Behavior During Therapy: WFL for tasks assessed/performed Overall Cognitive Status: Within Functional Limits for tasks assessed             General Comments      Exercises     Assessment/Plan    PT Assessment All further PT needs can be met in the next venue of care  PT Problem List Decreased activity tolerance;Cardiopulmonary status limiting activity       PT Treatment Interventions      PT Goals  (Current goals can be found in the Care Plan section)  Acute Rehab PT Goals Patient Stated Goal: return home with family support PT Goal Formulation: With patient/family Time For Goal Achievement: 09/18/19 Potential to Achieve Goals: Good    Frequency     Barriers to discharge        Co-evaluation        AM-PAC PT "6 Clicks" Mobility  Outcome Measure Help needed turning from your back to your side while in a flat bed without using bedrails?: None Help needed moving from lying on your back to sitting on the side of a flat bed without using bedrails?: None Help needed moving to and from a bed to a chair (including a wheelchair)?: None Help needed standing up from a chair using your arms (e.g., wheelchair or bedside chair)?: None Help needed to walk in hospital room?: None Help needed climbing 3-5 steps with a railing? : A Little 6 Click Score: 23    End of Session Equipment Utilized During Treatment: Oxygen(2LPM O2) Activity Tolerance: Patient tolerated treatment well;Patient limited by fatigue Patient left: in bed;with call bell/phone within reach;with family/visitor present Nurse Communication: Mobility status PT Visit Diagnosis: Other abnormalities of gait and mobility (R26.89)    Time: 1445-1520 PT Time Calculation (min) (ACUTE ONLY): 35 min   Charges:   PT Evaluation $PT Eval Low Complexity: 1 Low PT Treatments $Gait Training: 8-22 mins        Talbot Grumbling PT, DPT 09/18/19, 3:38 PM [phone]

## 2019-09-18 NOTE — Progress Notes (Signed)
Home O2 evaluation completed by RN and MD at bedside this afternoon. O2 sats remained 95-100% on r/a with standing and marching in place. No home O2 needed per MD. Discharge instructions reviewed with patient and daughter at bedside. AVS given. Follow-up scheduled with PCP. Verbalized understanding of instructions. Daughter states she will discuss home health agency choices with her siblings and they will notify Dr. Hilma Favors of which agency they choose. This was cleared with Dr. Denton Brick prior to discharge. IV site removed, site within normal limits. Patient in stable condition awaiting nursing staff escort off unit for discharge home. Donavan Foil, RN

## 2019-09-18 NOTE — Discharge Instructions (Signed)
° ° °  1)Avoid ibuprofen/Advil/Aleve/Motrin/Goody Powders/Naproxen/BC powders/Meloxicam/Diclofenac/Indomethacin and other Nonsteroidal anti-inflammatory medications as these will make you more likely to bleed and can cause stomach ulcers, can also cause Kidney problems.   2)Follow-up with the primary care physician for repeat complete blood count/CBC test on Monday , 09/23/2019  3) call or return if any further concerns about bleeding

## 2019-09-18 NOTE — TOC Initial Note (Signed)
Transition of Care [facility]) - Initial/Assessment Note    Patient Details  Name: [name] MRN: [id number] Date of Birth: 09/19/1933  [facility] CM/SW Contact:    [place], LCSW Phone Number: 09/18/2019, 3:55 PM  Clinical Narrative:     CSW at bedside to conduct TOC assessment. Patient was accompanied by her daughter earlier, who stepped out to get food. Pt reports that she lives at home alone but receives much assistance from her adult aged children. Pt does not currently have any DME or home health services in the home. CSW provided pt with choice list for Home health agency. CSW is awaiting decisions regarding home health provider from pts daughter [name], [place]: [phone].   TOC team will continue to follow patient for any discharge related needs.   [place] Transitions of Care  Clinical Social Worker  Ph: [phone]    Expected Discharge Plan: [place] Services Barriers to Discharge: Continued Medical Work up   Patient Goals and CMS Choice Patient states their goals for this hospitalization and ongoing recovery are:: to return home under the supervision of pts adult children CMS Medicare.gov Compare Post Acute Care list provided to:: Patient Choice offered to / list presented to : Patient  Expected Discharge Plan and Services Expected Discharge Plan: [place] In-house Referral: Clinical Social Work   Post Acute Care Choice: [place] arrangements for the past 2 months: [place] Expected Discharge Date: 09/18/19               DME Arranged: Oxygen DME Agency: [employer] Date DME Agency Contacted: 09/18/19 Time DME Agency Contacted: [phone] Representative spoke with at DME Agency: [place]: PT, OT, [place], Social Work [employer] Agency: [place] ([place]) Date [place]: 09/18/19 Time [place]: [place] Representative spoke with at [place]:  [place] Arrangements/Services Living arrangements for the past 2 months: [place] Lives with:: Self Patient language and need for interpreter reviewed:: Yes Do you feel safe going back to the place where you live?: Yes      Need for Family Participation in Patient Care: Yes (Comment) Care giver support system in place?: Yes (comment)   Criminal Activity/Legal Involvement Pertinent to Current Situation/Hospitalization: No - Comment as needed  Activities of Daily Living Home Assistive Devices/Equipment: Eyeglasses ADL Screening (condition at time of admission) Patient's cognitive ability adequate to safely complete daily activities?: Yes Is the patient deaf or have difficulty hearing?: No Does the patient have difficulty seeing, even when wearing glasses/contacts?: No Does the patient have difficulty concentrating, remembering, or making decisions?: No Patient able to express need for assistance with ADLs?: Yes Does the patient have difficulty dressing or bathing?: No Independently performs ADLs?: Yes (appropriate for developmental age) Does the patient have difficulty walking or climbing stairs?: No Weakness of Legs: None Weakness of Arms/Hands: None  Permission Sought/Granted Permission sought to share information with : Case Manager Permission granted to share information with : Yes, Verbal Permission Granted  Share Information with NAME: [name]  Permission granted to share info w AGENCY: [employer]  Permission granted to share info w Relationship: Daughter  Permission granted to share info w Contact Information: [name] [phone]  Emotional Assessment Appearance:: Appears stated age Attitude/Demeanor/Rapport: Engaged Affect (typically observed): Accepting, Pleasant Orientation: : Oriented to Self, Oriented to Place, Oriented to  Time, Oriented to Situation Alcohol / Substance Use: Never Used  Psych Involvement: No (comment)  Admission  diagnosis:  Elevated lactic acid level [R79.89] Symptomatic anemia [D64.9] Acute on chronic anemia [D64.9] Patient Active Problem List   Diagnosis Date Noted  . Acute on chronic anemia 09/17/2019  . Elevated lactic acid level 09/17/2019  . AKI (acute kidney injury) (HCC) 09/17/2019  . GI hemorrhage 09/09/2019  . Acute blood loss anemia 09/01/2019  . Lower GI bleed 09/01/2019  . Syncope 09/01/2019  . Hypothyroidism 09/01/2019  . Hypokalemia 09/01/2019  . Chronic diarrhea 09/01/2019  . Symptomatic anemia 08/27/2019  . Portal hypertension --- liver cirrhosis 08/27/2019  . Cirrhosis of liver with ascites and portal hypertension 08/27/2019  . Diarrhea 04/11/2019   PCP:  Assunta Found, MD Pharmacy:   [facility] - [place], [place] - [address] [place] [address] Phone: [phone] Fax: [phone]     Social Determinants of Health (SDOH) Interventions    Readmission Risk Interventions No flowsheet data found.

## 2019-09-18 NOTE — Plan of Care (Signed)

## 2019-09-18 NOTE — Discharge Summary (Signed)
[name], is a 85 y.o. female  DOB [birth year]  MRN [id number].  Admission date:  09/17/2019  Admitting Physician  Angie Fava, DO  Discharge Date:  09/18/2019   Primary MD  Assunta Found, MD  Recommendations for primary care physician for things to follow:   1)Avoid ibuprofen/Advil/Aleve/Motrin/Goody Powders/Naproxen/BC powders/Meloxicam/Diclofenac/Indomethacin and other Nonsteroidal anti-inflammatory medications as these will make you more likely to bleed and can cause stomach ulcers, can also cause Kidney problems.   2)Follow-up with the primary care physician for repeat complete blood count/CBC test on Monday , 09/23/2019  3) call or return if any further concerns about bleeding Admission Diagnosis  Elevated lactic acid level [R79.89] Symptomatic anemia [D64.9] Acute on chronic anemia [D64.9]   Discharge Diagnosis  Elevated lactic acid level [R79.89] Symptomatic anemia [D64.9] Acute on chronic anemia [D64.9]   Principal Problem:   Acute on chronic anemia Active Problems:   Cirrhosis of liver with ascites and portal hypertension   Hypothyroidism   Elevated lactic acid level   AKI (acute kidney injury) (HCC)      Past Medical History:  Diagnosis Date   Cirrhosis (HCC)    GERD (gastroesophageal reflux disease)    GI bleed    Hypertension    Sjogren's disease (HCC)    Thyroid disease    hypothyroidism    Past Surgical History:  Procedure Laterality Date   COLONOSCOPY WITH ESOPHAGOGASTRODUODENOSCOPY (EGD) AND ESOPHAGEAL DILATION (ED)     years ago   COLONOSCOPY WITH PROPOFOL N/A 08/30/2019   Procedure: COLONOSCOPY WITH PROPOFOL;  Surgeon: Malissa Hippo, MD;  [facility]: [facility];  Service: Endoscopy;  Laterality: N/A;   complete hysterectomy     ESOPHAGOGASTRODUODENOSCOPY (EGD) WITH PROPOFOL N/A 08/30/2019   Procedure: ESOPHAGOGASTRODUODENOSCOPY (EGD)  WITH PROPOFOL;  Surgeon: Malissa Hippo, MD;  Location: [facility];  Service: Endoscopy;  Laterality: N/A;   left rotator cuff         HPI  from the history and physical done on the day of admission:    - Chief Complaint: Shortness of breath  HPI: [name] is a 85 y.o. female with medical history significant for chronic diastolic heart failure, chronic iron deficiency anemia with baseline hemoglobin of approximately 10, recurrent lower gastrointestinal bleed, acquired hypothyroidism, Sjogren's disease, cirrhosis, who is admitted to [facility] on 09/17/2019 with acute on chronic anemia after presenting from home to [facility] emergency department complaining of shortness of breath.  The patient has been hospitalized multiple times over the last month for intermittent suspected lower gastrointestinal bleed, with recurrent blood transfusions over that time.  On 08/30/2019, the patient underwent EGD and colonoscopy, which reportedly showed evidence of diverticulosis without evidence of active bleeding.  On 09/06/2019, hemoglobin was noted to be 10.6, following which on 09/09/2019, the patient developed additional bright red blood per rectum, prompting presentation to [facility], at which time hemoglobin was noted to be 7.8.  At the time of presentation on 09/09/2019, the patient noted associated shortness  of breath, which resolved following transfusion of 1 unit PRBC, with follow-up hemoglobin noted to be 8.6 upon discharge, which occurred on 09/10/2019.  During this most recent hospitalization, gastroenterology was consulted, and did not feel that SBP prophylaxis in the setting of underlying cirrhosis was warranted.  Given hemodynamic stability, appropriate improvement in hemoglobin following transfusion of 1 unit PRBC, and resolution of presenting shortness of breath following interval transfusion, gastroenterology did not feel that repeat endoscopic evaluation was  warranted, but rather felt that interval acute lower gastrointestinal bleed was likely on the basis of diverticular bleed given the result of diverticulosis observed on colonoscopy performed on 08/30/2019.  Subsequent to the aforementioned bright red blood per rectum on 09/09/2019, the patient denies any subsequent hematochezia, and also denies any recent melena.  Denies any recent abdominal pain, nausea, vomiting, or hematemesis. However, over the last 3 to 4 days, the patient reports mild, but progressive shortness of breath, similar to that which she was experiencing at the time of her presentation to [facility] emergency department on 09/09/2019.  She denies any associated subjective fever, chills, rigors, or generalized myalgias.  She also denies any associated headache, neck stiffness, sore throat, cough, or rash.  Denies any associated chest pain, diaphoresis, or palpitations.  Denies any recent traveling or known positive Covid exposures.  She denies any associated lower extremity edema, lower extremity erythema, or calf tenderness.  The patient confirms that she is on no blood thinning agents, including no aspirin as an outpatient.  She denies any history of alcohol consumption.  In the setting of progressive shortness of breath, the patient was brought to [facility] emergency department this evening via EMS for further evaluation.  Per EMS, initial oxygen saturation was noted to be in the low 70s, which improved to 100% on 6 L nasal cannula.   ED Course: Vital signs in the emergency department this evening were notable for the following: Temperature max 97.8, heart rate initially 104, which improved to 94 following initiation of PRBC transfusion, blood pressure ranged from 127/81-140 3/65; respiratory rate 20-24; initial oxygen saturation on 6 L nasal cannula was noted to be 100%.  Interval decrease in supplemental oxygen from 6 L nasal cannula to 3 L nasal cannula has been associated with  oxygen saturations in the range of 98 to 100%.  Labs in the ED tonight were notable for the following: CMP notable for sodium 133, potassium 4.7, bicarbonate 16, anion gap 16, BUN 22, creatinine 0.99 relative to most recent prior creatinine data point of 0.68 on 09/12/2019, alkaline phosphatase 88, AST 50, ALT 29, and total bilirubin 1.1.  CBC notable for white blood cell count of 10,000, hemoglobin 6.6 relative to most recent prior hemoglobin value of 8.4 on 09/12/2019, platelets 426.  Initial lactic acid 6.7, with repeat value of 4.3.   Nasopharyngeal COVID-19 swab was performed in the ED, with results currently pending.  Blood cultures x2 were collected.  Chest x-ray was associated with suboptimal inspiratory effort, small left pleural effusion, and left basilar opacity favoring atelectasis in the setting of suboptimal inspiratory effort, but showed no evidence of pneumothorax or pulmonary edema.  Presenting EKG showed sinus tachycardia with heart rate 103, nonspecific intraventricular conduction delay with QRS 130 ms, septal/inferior Q waves, unchanged from most recent prior EKG, no evidence of interval T wave or ST changes.  Rectal exam revealed Hemoccult negative stool.  While still in the ED, the patient was typed and crossed for 2 units PRBC, with transfusion  of such initiated.  Otherwise, the patient received no IV fluids or medications while in the ED.  Review of Systems: As per HPI otherwise 10 point review of systems negative.     Hospital Course:    Brief Summary 85 y.o.femalewith medical history significant forhypothyroidism, Sjogren's syndrome, liver cirrhosis with portal hypertension, depression, chronic diarrhea,with recurrent hospitalization of recurrent transfusion due to recurrent diverticular bleed readmitted again on 09/09/2019 with another episode of diverticular bleed  A/p 1)Acute blood loss anemia secondary to presumed diverticular bleed---on admission hemoglobin  was down to 6.6 from a recent level of 8.6 on 09/10/19 --Patient received2 unit of PRBC on 09/17/19 - Hgb is up to 9.9 from 6.6 posttransfusion -No further bloody BMs, -Patient had brown stools prior to discharge -Discussed with Dr. Karilyn Cota who advised no further work-up at this time-as H&H is stable and no further bleeding -May needRBC Tagscan if further bleeding  2)Livercirrhosis--recently diagnosed,follow-up with GI as outpatient---may need paracentesis as needed, can be arranged as outpatient by GI service  3)hypothyroidism--TSH elevated at 18, Armour Thyroid increased, recheck TSH with Dr. Phillips Odor 2 to 3 weeks  4) concerns about possible acute hypoxic respiratory failure----patient evaluated with RN Morrie Sheldon at bedside--- O2 sats on room air 96 to 98% at rest, post ambulation O2 sats remained in the 96 to 99% range even after 20 minutes   Discharge Condition: stable  Follow UP--PCP for repeat CBC, and GI service Dr. Karilyn Cota for management of liver cirrhosis   Diet and Activity recommendation:  As advised Discharge Condition: Stable  Diet and Activity recommendation:  As advised  Discharge Instructions    Discharge Instructions    Call MD for:  difficulty breathing, headache or visual disturbances   Complete by: As directed    Call MD for:  extreme fatigue   Complete by: As directed    Call MD for:  persistant dizziness or light-headedness   Complete by: As directed    Call MD for:  persistant nausea and vomiting   Complete by: As directed    Call MD for:  severe uncontrolled pain   Complete by: As directed    Call MD for:  temperature >100.4   Complete by: As directed    Diet - low sodium heart healthy   Complete by: As directed    Discharge instructions   Complete by: As directed    1)Avoid ibuprofen/Advil/Aleve/Motrin/Goody Powders/Naproxen/BC powders/Meloxicam/Diclofenac/Indomethacin and other Nonsteroidal anti-inflammatory medications as these will make you  more likely to bleed and can cause stomach ulcers, can also cause Kidney problems.   2)Follow-up with the primary care physician for repeat complete blood count/CBC test on Monday , 09/23/2019  3) call or return if any further concerns about bleeding   Increase activity slowly   Complete by: As directed       Discharge Medications     Allergies as of 09/18/2019      Reactions   Ciprofloxacin Other (See Comments)   NO REACTION LISTED   Ciprocin-fluocin-procin [fluocinolone Acetonide]    Codeine    Unknown ( per daughter)   Demerol    Unknown ( per daughter)   Penicillins    Did it involve swelling of the face/tongue/throat, SOB, or low BP? Unknown Did it involve sudden or severe rash/hives, skin peeling, or any reaction on the inside of your mouth or nose? Unknown Did you need to seek medical attention at a hospital or doctor's office? Yes When did it last happen?Uknown If all above answers are "  NO", may proceed with cephalosporin use. No issue with ceftriaxone   Sulfa Antibiotics    Unknown ( per daughter)   Trazodone And Nefazodone Other (See Comments)   Confusion and hallucinations      Medication List    TAKE these medications   BENEFIBER DRINK MIX PO Take 1 packet by mouth daily. With breakfast   Calcium-Magnesium-Zinc 333-133-5 MG Tabs Take 1 tablet by mouth every morning.   co-enzyme Q-10 30 MG capsule Take 30 mg by mouth daily.   esomeprazole 20 MG capsule Commonly known as: NEXIUM Take 1 capsule (20 mg total) by mouth at bedtime.   FISH OIL PO Take 1 capsule by mouth daily.   Iron 325 (65 Fe) MG Tabs Take 1 tablet (325 mg total) by mouth 2 (two) times daily before lunch and supper. What changed: when to take this   loperamide 2 MG capsule Commonly known as: IMODIUM Take 2 mg by mouth daily as needed for diarrhea or loose stools.   multivitamin with minerals Tabs tablet Take 1 tablet by mouth every morning.   sertraline 25 MG  tablet Commonly known as: ZOLOFT Take 25 mg by mouth every morning.   thyroid 120 MG tablet Commonly known as: ARMOUR Take 120 mg by mouth daily before breakfast. What changed: Another medication with the same name was removed. Continue taking this medication, and follow the directions you see here.   Turmeric 400 MG Caps Take 400 mg by mouth every morning.   vitamin B-12 250 MCG tablet Commonly known as: CYANOCOBALAMIN Take 250 mcg by mouth every morning.   Vitamin D3 10 MCG (400 UNIT) Caps Take 1,000 mg by mouth daily.      Major procedures and Radiology Reports - PLEASE review detailed and final reports for all details, in brief -   Cxr  Result Date: 08/27/2019 CLINICAL DATA:  Rectal blood EXAM: CHEST - 2 VIEW COMPARISON:  November 04, 2014 FINDINGS: There is mild cardiomegaly. Aortic knob calcifications. No large airspace consolidation or pleural effusion. There is streaky atelectasis or scarring seen at the left lung base. No acute osseous abnormality. IMPRESSION: Mild cardiomegaly. Streaky atelectasis or scarring at the left lung base. Electronically Signed   By: Jonna ClarkBindu  Avutu M.D.   On: 08/27/2019 21:38   Ct Head Wo Contrast  Result Date: 08/27/2019 CLINICAL DATA:  cHead trauma, minor, GCS>=13, high clinical risk, initial exam, syncope. C-spine trauma, ligamentous injury suspected. EXAM: CT HEAD WITHOUT CONTRAST CT CERVICAL SPINE WITHOUT CONTRAST TECHNIQUE: Multidetector CT imaging of the head and cervical spine was performed following the standard protocol without intravenous contrast. Multiplanar CT image reconstructions of the cervical spine were also generated. COMPARISON:  Head CT 06/13/2008 FINDINGS: CT HEAD FINDINGS Brain: No evidence of acute intracranial hemorrhage. No demarcated cortical infarction. No evidence of intracranial mass. No midline shift or extra-axial fluid collection. Minimal ill-defined hypoattenuation within the cerebral white matter is nonspecific, but  consistent with chronic small vessel ischemic disease. Mild generalized parenchymal atrophy. Vascular: No hyperdense vessel. Skull: Normal. Negative for fracture or focal lesion. Sinuses/Orbits: Visualized orbits demonstrate no acute abnormality. No significant paranasal sinus disease or mastoid effusion. CT CERVICAL SPINE FINDINGS Mildly motion degraded examination. Alignment: No significant spondylolisthesis Skull base and vertebrae: The basion-dental and atlanto-dental intervals are maintained.No evidence of acute fracture to the cervical spine. Soft tissues and spinal canal: No prevertebral fluid or swelling. No visible canal hematoma. Disc levels: Cervical spondylosis with multilevel posterior disc osteophytes, uncovertebral and facet hypertrophy. Upper chest:  Emphysema within the imaged lung apices. No visible pneumothorax. Other: Carotid artery atherosclerotic calcification. IMPRESSION: CT head: 1. No evidence of acute intracranial abnormality. 2. Mild generalized parenchymal atrophy and chronic small vessel ischemic disease CT cervical spine: 1. No evidence of acute fracture to the cervical spine. 2. Cervical spondylosis as described. Electronically Signed   By: Jackey Loge DO   On: 08/27/2019 14:03   Ct Cervical Spine Wo Contrast  Result Date: 08/27/2019 CLINICAL DATA:  Cervical spine trauma secondary to a fall. The patient was found down in the bathroom. EXAM: CT CERVICAL SPINE WITHOUT CONTRAST TECHNIQUE: Multidetector CT imaging of the cervical spine was performed without intravenous contrast. Multiplanar CT image reconstructions were also generated. COMPARISON:  None. FINDINGS: Alignment: Normal. Skull base and vertebrae: No acute fracture. No primary bone lesion or focal pathologic process. Soft tissues and spinal canal: The prevertebral soft tissues are normal. There is a 15 mm rim calcified nodule in the right lobe of the thyroid gland. No visible canal hematoma. Disc levels: C2-3: Tiny central  disc bulge with no neural impingement. Moderate right facet arthritis. No foraminal stenosis. C3-4: Marked disc space narrowing. Small broad-based disc osteophyte complex most prominent centrally. No foraminal stenosis. C4-5: Chronic disc space narrowing. Small disc bulge with accompanying osteophytes to the left of midline. No foraminal stenosis. C5-6: Chronic disc space narrowing. Slight narrowing of the right neural foramen due to uncinate spurs. No disc bulging or protrusion. C6-7: Disc space narrowing. Minimal endplate osteophytes to the left of midline without neural impingement. Widely patent neural foramina. C7-T1: Normal disc. Slight left facet arthritis. No foraminal stenosis. Upper chest: Negative. Other: None IMPRESSION: 1. No acute abnormality of the cervical spine. Multilevel degenerative disc and joint disease. 2. 15 mm rim calcified nodule in the right lobe of the thyroid gland. Electronically Signed   By: Francene Boyers M.D.   On: 08/27/2019 13:57   US Abdomen Complete  Result Date: 08/28/2019 CLINICAL DATA:  Abdominal pain and rectal bleeding. EXAM: ABDOMEN ULTRASOUND COMPLETE COMPARISON:  CT [facility] 08/27/2019 FINDINGS: Gallbladder: Gallbladder wall is thickened measuring 7 mm. Negative sonographic Murphy's sign. No stone or sludge identified. Common bile duct: Diameter: 5 mm Liver: Micro nodular contour of the liver is identified with heterogeneous echotexture. Portal vein is patent on color Doppler imaging with normal direction of blood flow towards the liver. IVC: No abnormality visualized. Pancreas: Visualized portion unremarkable. Spleen: Size and appearance within normal limits. Right Kidney: Length: 10.1 cm. Echogenicity within normal limits. No mass or hydronephrosis visualized. Left Kidney: Length: 10.8 cm. Cyst within inferior pole of the left kidney measures 5.4 x 4.3 x 4.7 cm. Echogenicity within normal limits. No mass or hydronephrosis visualized. Abdominal aorta: No aneurysm  visualized.Aortic atherosclerosis. Other findings: Ascites and bilateral pleural effusions. IMPRESSION: 1. Cirrhosis of the liver. Ascites is identified compatible with portal venous hypertension. 2. Gallbladder wall thickening. In the setting of portal venous hypertension and ascites this may be a nonspecific finding. No gallstones or sonographic Murphy's sign noted. 3. Ascites, bilateral pleural effusions. 4.  Aortic Atherosclerosis (ICD10-I70.0). Electronically Signed   By: Signa Kell M.D.   On: 08/28/2019 11:28   Ct Abdomen Pelvis W Contrast  Result Date: 08/27/2019 CLINICAL DATA:  85 year old female with abdominal pain and rectal bleeding. EXAM: CT ABDOMEN AND PELVIS WITH CONTRAST TECHNIQUE: Multidetector CT imaging of the abdomen and pelvis was performed using the standard protocol following bolus administration of intravenous contrast. CONTRAST:  OMNIPAQUE IOHEXOL 300 MG/ML  SOLN  COMPARISON:  None. FINDINGS: Lower chest: Partially visualized small bilateral pleural effusions with associated partial compressive atelectasis of the lung bases. Pneumonia is not excluded. Clinical correlation is recommended. There is diffuse interstitial and interlobular septal thickening of the visualized lung bases most consistent with edema. There is mild cardiomegaly. A pericardial effusion is partially visualized measuring approximately 1 cm in thickness. No intra-abdominal free air. There is a moderate size ascites. Hepatobiliary: Nodular liver contour in keeping with cirrhosis. No intrahepatic biliary ductal dilatation. The gallbladder is unremarkable. Pancreas: Unremarkable. No pancreatic ductal dilatation or surrounding inflammatory changes. Spleen: Normal in size without focal abnormality. Adrenals/Urinary Tract: The right adrenal gland is unremarkable. The left adrenal gland is not well visualized. There is no hydronephrosis on either side. There is symmetric enhancement and excretion of contrast by both  kidneys. There is a 4.5 cm left renal inferior pole cyst. The urinary bladder is grossly unremarkable. Stomach/Bowel: There is sigmoid diverticulosis without active inflammatory changes. There is diffuse thickened appearance of the colon which may be related to ascites and hepatic colopathy. Colitis is not excluded. Clinical correlation is recommended. Similarly there is diffuse thickened appearance of the stomach and small bowel likely related to ascites and liver disease. There is a small hiatal hernia with evidence of gastroesophageal reflux. The appendix is not visualized with certainty. Vascular/Lymphatic: There is advanced aortoiliac atherosclerotic disease. The IVC is unremarkable. No portal venous gas. There is no adenopathy. The SMV, splenic vein, and main portal vein are patent. Reproductive: Hysterectomy. No adnexal masses. Other: Diffuse subcutaneous edema and anasarca. Musculoskeletal: Degenerative changes of the spine. No acute osseous pathology. IMPRESSION: 1. Cirrhosis with evidence of portal hypertension, moderate ascites, and anasarca. 2. Thickened appearance of the colon and small bowel likely related to ascites and liver disease. Colitis is not excluded. Clinical correlation is recommended. 3. Sigmoid diverticulosis. 4. Cardiomegaly with findings of CHF. Partially visualized small bilateral pleural effusions with associated partial compressive atelectasis of the lung bases. Pneumonia is not excluded. Clinical correlation is recommended. Aortic Atherosclerosis (ICD10-I70.0). Electronically Signed   By: Elgie Collard M.D.   On: 08/27/2019 14:01   Dg Chest Portable 1 View  Result Date: 09/17/2019 CLINICAL DATA:  85 year old female with shortness of breath. EXAM: PORTABLE CHEST 1 VIEW COMPARISON:  Chest radiograph dated 08/27/2019. FINDINGS: Shallow inspiration. There is a small left pleural effusion with left lung base atelectasis versus infiltrate. Mild diffuse interstitial prominence may be  related to atelectatic changes. There is no pneumothorax. Stable cardiomegaly. Atherosclerotic calcification of the aortic arch. No acute osseous pathology. IMPRESSION: 1. Small left pleural effusion with left lung base atelectasis versus infiltrate. 2. Stable cardiomegaly. Electronically Signed   By: Elgie Collard M.D.   On: 09/17/2019 17:22    Micro Results   Recent Results (from the past 240 hour(s))  SARS CORONAVIRUS 2 (TAT 6-24 HRS) Nasopharyngeal Nasopharyngeal Swab     Status: None   Collection Time: 09/09/19  3:43 AM   Specimen: Nasopharyngeal Swab  Result Value Ref Range Status   SARS Coronavirus 2 NEGATIVE NEGATIVE Final    Comment: (NOTE) SARS-CoV-2 target nucleic acids are NOT DETECTED. The SARS-CoV-2 RNA is generally detectable in upper and lower respiratory specimens during the acute phase of infection. Negative results do not preclude SARS-CoV-2 infection, do not rule out co-infections with other pathogens, and should not be used as the sole basis for treatment or other patient management decisions. Negative results must be combined with clinical observations, patient history, and epidemiological information. The  expected result is Negative. Fact Sheet for Patients: HairSlick.no Fact Sheet for Healthcare Providers: quierodirigir.com This test is not yet approved or cleared by the [place] FDA and  has been authorized for detection and/or diagnosis of SARS-CoV-2 by FDA under an Emergency Use Authorization (EUA). This EUA will remain  in effect (meaning this test can be used) for the duration of the COVID-19 declaration under Section 56 4(b)(1) of the Act, 21 U.S.C. section 360bbb-3(b)(1), unless the authorization is terminated or revoked sooner. Performed at [facility], [address], [place], [place] [address]   Blood culture (routine x 2)     Status: None (Preliminary result)   Collection Time:  09/17/19  4:48 PM   Specimen: BLOOD RIGHT FOREARM  Result Value Ref Range Status   Specimen Description BLOOD RIGHT FOREARM  Final   Special Requests   Final    BOTTLES DRAWN AEROBIC AND ANAEROBIC Blood Culture adequate volume   Culture   Final    NO GROWTH < 24 HOURS Performed at [facility], [address] [place], [place] [address]    Report Status PENDING  Incomplete  SARS CORONAVIRUS 2 (TAT 6-24 HRS) Nasopharyngeal Nasopharyngeal Swab     Status: None   Collection Time: 09/17/19  5:24 PM   Specimen: Nasopharyngeal Swab  Result Value Ref Range Status   SARS Coronavirus 2 NEGATIVE NEGATIVE Final    Comment: (NOTE) SARS-CoV-2 target nucleic acids are NOT DETECTED. The SARS-CoV-2 RNA is generally detectable in upper and lower respiratory specimens during the acute phase of infection. Negative results do not preclude SARS-CoV-2 infection, do not rule out co-infections with other pathogens, and should not be used as the sole basis for treatment or other patient management decisions. Negative results must be combined with clinical observations, patient history, and epidemiological information. The expected result is Negative. Fact Sheet for Patients: HairSlick.no Fact Sheet for Healthcare Providers: quierodirigir.com This test is not yet approved or cleared by the [place] FDA and  has been authorized for detection and/or diagnosis of SARS-CoV-2 by FDA under an Emergency Use Authorization (EUA). This EUA will remain  in effect (meaning this test can be used) for the duration of the COVID-19 declaration under Section 56 4(b)(1) of the Act, 21 U.S.C. section 360bbb-3(b)(1), unless the authorization is terminated or revoked sooner. Performed at [facility], [address], [place], [place] [address]   SARS Coronavirus 2 by RT PCR (hospital order, performed in [facility] hospital lab) Nasopharyngeal Nasopharyngeal  Swab     Status: None   Collection Time: 09/17/19  8:10 PM   Specimen: Nasopharyngeal Swab  Result Value Ref Range Status   SARS Coronavirus 2 NEGATIVE NEGATIVE Final    Comment: (NOTE) SARS-CoV-2 target nucleic acids are NOT DETECTED. The SARS-CoV-2 RNA is generally detectable in upper and lower respiratory specimens during the acute phase of infection. The lowest concentration of SARS-CoV-2 viral copies this assay can detect is 250 copies / mL. A negative result does not preclude SARS-CoV-2 infection and should not be used as the sole basis for treatment or other patient management decisions.  A negative result may occur with improper specimen collection / handling, submission of specimen other than nasopharyngeal swab, presence of viral mutation(s) within the areas targeted by this assay, and inadequate number of viral copies (<250 copies / mL). A negative result must be combined with clinical observations, patient history, and epidemiological information. Fact Sheet for Patients:   BoilerBrush.com.cy Fact Sheet for  Healthcare Providers: BankingDealers.co.za This test is not yet approved or cleared  by the [place] and has been authorized for detection and/or diagnosis of SARS-CoV-2 by FDA under an Emergency Use Authorization (EUA).  This EUA will remain in effect (meaning this test can be used) for the duration of the COVID-19 declaration under Section 564(b)(1) of the Act, 21 U.S.C. section 360bbb-3(b)(1), unless the authorization is terminated or revoked sooner. Performed at [facility], [address] [place], [place] [address]        Today   Subjective    [name] today has no new complaints -Daughter at bedside, questions answered =-Ambulated in the room without hypoxia          Patient has been seen and examined prior to discharge   Objective   Blood pressure 135/79, pulse 92, temperature 97.7 F  (36.5 C), temperature source Oral, resp. rate 20, height 5\' 1"  (1.549 m), weight 46.5 kg, SpO2 90 %.   Intake/Output Summary (Last 24 hours) at 09/18/2019 1630 Last data filed at 09/18/2019 1245 Gross per 24 hour  Intake 1144.1 ml  Output 300 ml  Net 844.1 ml    Exam Gen:- Awake Alert, thin appearing, no acute distress  HEENT:- [place].AT, No sclera icterus Neck-Supple Neck,No JVD,.  Lungs-  CTAB , good air movement bilaterally  CV- S1, S2 normal, regular Abd-  +ve B.Sounds, Abd Soft, No tenderness,    Extremity/Skin:- No  edema,   good pulses Psych-affect is appropriate, oriented x3 Neuro-generalized weakness no new focal deficits, no tremors    Data Review   CBC w Diff:  Lab Results  Component Value Date   WBC 10.2 09/18/2019   HGB 9.9 (L) 09/18/2019   HCT 30.7 (L) 09/18/2019   PLT 275 09/18/2019   LYMPHOPCT 10 09/17/2019   MONOPCT 10 09/17/2019   EOSPCT 0 09/17/2019   BASOPCT 0 09/17/2019    CMP:  Lab Results  Component Value Date   NA 136 09/18/2019   K 4.7 09/18/2019   CL 104 09/18/2019   CO2 21 (L) 09/18/2019   BUN 25 (H) 09/18/2019   CREATININE 0.85 09/18/2019   PROT 5.4 (L) 09/18/2019   ALBUMIN 2.5 (L) 09/18/2019   BILITOT 1.8 (H) 09/18/2019   ALKPHOS 77 09/18/2019   AST 37 09/18/2019   ALT 25 09/18/2019  .   Total Discharge time is about 33 minutes  Roxan Hockey M.D on 09/18/2019 at 4:30 PM  Go to [email or web address] -  for contact info  [facility] - Office  [phone]

## 2019-09-19 LAB — TYPE AND SCREEN
ABO/RH(D): A POS
Antibody Screen: NEGATIVE
Unit division: 0
Unit division: 0

## 2019-09-19 LAB — BPAM RBC
Blood Product Expiration Date: 202012202359
Blood Product Expiration Date: 202012222359
ISSUE DATE / TIME: 202012011908
ISSUE DATE / TIME: 202012020102
Unit Type and Rh: 6200
Unit Type and Rh: 6200

## 2019-09-22 LAB — CULTURE, BLOOD (ROUTINE X 2)
Culture: NO GROWTH
Special Requests: ADEQUATE

## 2019-09-25 DIAGNOSIS — D649 Anemia, unspecified: Secondary | ICD-10-CM | POA: Diagnosis not present

## 2019-09-25 DIAGNOSIS — D62 Acute posthemorrhagic anemia: Secondary | ICD-10-CM | POA: Diagnosis not present

## 2019-09-25 DIAGNOSIS — Z6821 Body mass index (BMI) 21.0-21.9, adult: Secondary | ICD-10-CM | POA: Diagnosis not present

## 2019-10-17 DIAGNOSIS — E039 Hypothyroidism, unspecified: Secondary | ICD-10-CM | POA: Diagnosis not present

## 2019-10-17 DIAGNOSIS — I10 Essential (primary) hypertension: Secondary | ICD-10-CM | POA: Diagnosis not present

## 2019-10-17 DIAGNOSIS — E7849 Other hyperlipidemia: Secondary | ICD-10-CM | POA: Diagnosis not present

## 2019-10-21 ENCOUNTER — Ambulatory Visit (INDEPENDENT_AMBULATORY_CARE_PROVIDER_SITE_OTHER): Payer: Medicare Other | Admitting: Gastroenterology

## 2019-10-21 ENCOUNTER — Other Ambulatory Visit: Payer: Self-pay

## 2019-10-21 ENCOUNTER — Encounter (INDEPENDENT_AMBULATORY_CARE_PROVIDER_SITE_OTHER): Payer: Self-pay | Admitting: Gastroenterology

## 2019-10-21 VITALS — BP 154/92 | HR 111 | Temp 97.5°F | Ht 60.0 in | Wt 129.0 lb

## 2019-10-21 DIAGNOSIS — E039 Hypothyroidism, unspecified: Secondary | ICD-10-CM | POA: Diagnosis not present

## 2019-10-21 DIAGNOSIS — R188 Other ascites: Secondary | ICD-10-CM

## 2019-10-21 DIAGNOSIS — K7460 Unspecified cirrhosis of liver: Secondary | ICD-10-CM

## 2019-10-21 NOTE — Progress Notes (Signed)
Patient profile: [name] is a 86 y.o. female seen for evaluation of hospital f/up. Last seen by Dr Laural Golden in hospital Nov 2020.    History of Present Illness: [name] is seen today for hospital follow-up.  Brief history-admitted November 2020 for rectal bleeding, during that admission hemoglobin down to 5.3.  She had a CT scan identifying new diagnosis of cirrhosis with portal hypertension & ascites. Cirrhosis felt to be cryptogenic or related to Sjogren's. Colonoscopy revealed 2 angiectasia's nonbleeding, bleeding felt diverticular in origin.  She was ultimately admitted again in December 2020 for SOB after additional episode of large volume painless hematochezia and had additional transfusion, bleeding resolved without intervention.    She is accompanied by her daughter [name] today.  She is living with one of her 2 daughters since discharge.  Biggest concern today seems to be feeling that her abdomen feels swollen/bloated, causes her to not to want to eat a lot given bloating.  She endorses some early satiety but denies post prandial abd pain.  She is eating 3 small meals a day. She denies any nausea vomiting or GERD.  Using Gas-X for the bloating without relief.  She denies any dysphagia.  Her weight is up as below. Sometimes feels hard to get a deep breath due to tightness in upper abd.   Continues to have frequent bowel movements (chronic issue) although this is improved compared to the summer. She eliminated milk which helped some. She is typically having soft formed stools 6-8 times a day with Imodium twice daily-does have nocturnal stooling despite imodium at bedtime, also wakes up to urinate.  She tried increasing her dose of Benefiber but this made the stools too hard. No obvious rectal bleeding since discharge early Dec 2020.  Denies any issues with encephalopathy type symptoms.  Wt Readings from Last 3 Encounters:  10/21/19 129 lb (58.5 kg)  09/18/19 102 lb 8.2 oz (46.5  kg)  09/10/19 112 lb 14 oz (51.2 kg)     Last Colonoscopy: Nov 2020 decreased sphincter tone DRE, 2 nonbleeding occult colonic angina dysplastic lesions, diverticulosis sigmoid, congested mucosa rectum Last Endoscopy: November 2020-esophageal mucosal changes suspicious for short segment Barrett's, 4 cm hiatal hernia, portal hypertensive gastropathy, congested duodenal mucosa   Past Medical History:  Past Medical History:  Diagnosis Date  . Cirrhosis ([place])   . GERD (gastroesophageal reflux disease)   . GI bleed   . Hypertension   . Sjogren's disease ([place])   . Thyroid disease    hypothyroidism    Problem List: Patient Active Problem List   Diagnosis Date Noted  . Acute on chronic anemia 09/17/2019  . Elevated lactic acid level 09/17/2019  . AKI (acute kidney injury) ([place]) 09/17/2019  . GI hemorrhage 09/09/2019  . Acute blood loss anemia 09/01/2019  . Lower GI bleed 09/01/2019  . Syncope 09/01/2019  . Hypothyroidism 09/01/2019  . Hypokalemia 09/01/2019  . Chronic diarrhea 09/01/2019  . Symptomatic anemia 08/27/2019  . Portal hypertension --- liver cirrhosis 08/27/2019  . Cirrhosis of liver with ascites and portal hypertension 08/27/2019  . Diarrhea 04/11/2019    Past Surgical History: Past Surgical History:  Procedure Laterality Date  . COLONOSCOPY WITH ESOPHAGOGASTRODUODENOSCOPY (EGD) AND ESOPHAGEAL DILATION (ED)     years ago  . COLONOSCOPY WITH PROPOFOL N/A 08/30/2019   Procedure: COLONOSCOPY WITH PROPOFOL;  Surgeon: Rogene Houston, MD;  Location: [facility];  Service: Endoscopy;  Laterality: N/A;  . complete hysterectomy    .  ESOPHAGOGASTRODUODENOSCOPY (EGD) WITH PROPOFOL N/A 08/30/2019   Procedure: ESOPHAGOGASTRODUODENOSCOPY (EGD) WITH PROPOFOL;  Surgeon: Rogene Houston, MD;  Location: [facility];  Service: Endoscopy;  Laterality: N/A;  . left rotator cuff      Allergies: Allergies  Allergen Reactions  . Ciprofloxacin Other (See Comments)    NO  REACTION LISTED  . Ciprocin-Fluocin-Procin [Fluocinolone Acetonide]   . Codeine     Unknown ( per daughter)  . Demerol     Unknown ( per daughter)  . Penicillins     Did it involve swelling of the face/tongue/throat, SOB, or low BP? Unknown Did it involve sudden or severe rash/hives, skin peeling, or any reaction on the inside of your mouth or nose? Unknown Did you need to seek medical attention at a hospital or doctor's office? Yes When did it last happen?Uknown If all above answers are "NO", may proceed with cephalosporin use. No issue with ceftriaxone  . Sulfa Antibiotics     Unknown ( per daughter)  . Trazodone And Nefazodone Other (See Comments)    Confusion and hallucinations      Home Medications:  Current Outpatient Medications:  .  Ascorbic Acid (VITAMIN C) 500 MG CAPS, Take by mouth daily., Disp: , Rfl:  .  Calcium-Magnesium-Zinc 333-133-5 MG TABS, Take 1 tablet by mouth every morning. , Disp: , Rfl:  .  Cholecalciferol (VITAMIN D3) 10 MCG (400 UNIT) CAPS, Take 1,000 mg by mouth daily. , Disp: , Rfl:  .  co-enzyme Q-10 30 MG capsule, Take 30 mg by mouth daily. , Disp: , Rfl:  .  esomeprazole (NEXIUM) 20 MG capsule, Take 1 capsule (20 mg total) by mouth at bedtime., Disp: , Rfl:  .  Ferrous Sulfate (IRON) 325 (65 Fe) MG TABS, Take 1 tablet (325 mg total) by mouth 2 (two) times daily before lunch and supper., Disp: 30 tablet, Rfl: 0 .  loperamide (IMODIUM) 2 MG capsule, Take 2 mg by mouth daily as needed for diarrhea or loose stools. , Disp: , Rfl:  .  Multiple Vitamin (MULTIVITAMIN WITH MINERALS) TABS tablet, Take 1 tablet by mouth every morning., Disp: , Rfl:  .  Omega-3 Fatty Acids (FISH OIL PO), Take 1 capsule by mouth daily., Disp: , Rfl:  .  sertraline (ZOLOFT) 25 MG tablet, Take 25 mg by mouth every morning. , Disp: , Rfl:  .  Simethicone (GAS-X PO), Take by mouth. Per daughter she is taking 2 twice a day for bloating, Disp: , Rfl:  .  thyroid (ARMOUR) 120 MG  tablet, Take 180 mg by mouth daily before breakfast. , Disp: , Rfl:  .  Turmeric 400 MG CAPS, Take 400 mg by mouth every morning. , Disp: , Rfl:  .  vitamin B-12 (CYANOCOBALAMIN) 250 MCG tablet, Take 250 mcg by mouth every morning., Disp: , Rfl:  .  Wheat Dextrin (BENEFIBER DRINK MIX PO), Take 1 packet by mouth daily. With breakfast, Disp: , Rfl:    Family History: family history is not on file.    Social History:   reports that she has never smoked. She has never used smokeless tobacco. She reports that she does not drink alcohol or use drugs.   Review of Systems: Constitutional: + weight gain  Eyes: No changes in vision. ENT: No oral lesions, sore throat.  GI: see HPI.  Heme/Lymph: No easy bruising.  CV: No chest pain.  GU: No hematuria.  Integumentary: No rashes.  Neuro: No headaches.  Psych: No depression/anxiety.  Endocrine:  No heat/cold intolerance.  Allergic/Immunologic: No urticaria.  Resp: No cough, SOB.  Musculoskeletal: + weakness    Physical Examination: BP (!) 154/92 (BP Location: Right Arm, Patient Position: Sitting, Cuff Size: Small)   Pulse (!) 111   Temp (!) 97.5 F (36.4 C) (Temporal)   Ht 5' (1.524 m)   Wt 129 lb (58.5 kg)   BMI 25.19 kg/m  Gen: NAD, alert and oriented x 4, in wheelchair.  HEENT: [place], EOMI, Neck: supple, no JVD Chest: CTA bilaterally, no wheezes, crackles, or other adventitious sounds CV: RRR, no m/g/c/r Abd: soft, NT, ND, +BS in all four quadrants; no HSM, guarding, ridigity, or rebound tenderness, + mild/moderate ascites (not tense) Ext: no edema, well perfused with 2+ pulses, Skin: no rash or lesions noted on observed skin Lymph: no noted LAD  Data Reviewed:  IMPRESSION: Ultrasound November 2020 1. Cirrhosis of the liver. Ascites is identified compatible with portal venous hypertension. 2. Gallbladder wall thickening. In the setting of portal venous hypertension and ascites this may be a nonspecific finding. No gallstones  or sonographic Murphy's sign noted. 3. Ascites, bilateral pleural effusions. 4.  Aortic Atherosclerosis  09/17/19-IMPRESSION: CXR 1. Small left pleural effusion with left lung base atelectasis versus infiltrate. 2. Stable cardiomegaly.   Labs at d/c - CBC w/ Hgb 9.7, platelet 275, WBC 10.2, CMP w/ albumin 2.5, AST 37, ALT 25, alk phos 77, Tbili 1.8, BNP 1760, INR 1.4   Cirrhosis serologic w/up (in patient 08/2019) with negative ANA, negative celiac panel, normal immunoglobulin panel and AMA. APF normal. Immune to hep A&B  July 2020--negative GI PCR    Assessment/Plan: Ms. Bircher is a 86 y.o. female    Khai was seen today for follow-up.  Diagnoses and all orders for this visit:  Cirrhosis of liver with ascites, unspecified hepatic cirrhosis type ([place]) -     INR/PT -     CBC with Differential -     CMP -     Fe+TIBC+Fer  Hypothyroidism, unspecified type -     TSH + free T4    1.  Cirrhosis-diagnosed on admission for GI bleed when liver appeared cirrhotic on CT imaging, she had an endoscopy without varices while admitted. AFP was normal.  Imaging showed small amount of ascites, she has not had a paracentesis. Etiology felt cryptogenic vs. Sjogren's related   Weight is up approximately 17 pounds since admission and has some ascites on exam today (not tense), will repeat a CMP to ensure kidney function normal and start Lasix 20m & Aldactone 570mpending discussion with Dr. ReLaural GoldenRepeat BMP 1-2 weeks after start diuretic. Per daughter has lower extremity edema in evenings (minimal on exam in AM in office), also has CHF which may be contributing (last BNP 1700). Wearing compression stockings. Will check basic labs as above today.     2.  Hypothyroidism-requests thyroid studies be added on to labs today, has had dose adjusted recently by PCP-we will fax results to PCP  3.  Anemia-iron deficiency and admitted for GI bleed/symptomatic anemia in November and December 2020,  required transfusion, last hemoglobin at discharge was 9, will repeat CBC and iron today. No obvious bleeding.  She is on iron twice daily.  Previously was using heavy dose aspirin powders but not currently using-reviewed importance of this. To notify me with any return of bleeding.   Will f/up with patient via phone when labs return. Case discussed w/ Dr ReLaural Golden I personally performed the service, non-incident to. (  WP)  Laurine Blazer, PA-C [facility]

## 2019-10-21 NOTE — Patient Instructions (Signed)
We are checking labs today - I will discuss w/ Dr Karilyn Cota and call with recomendations

## 2019-10-22 ENCOUNTER — Telehealth (INDEPENDENT_AMBULATORY_CARE_PROVIDER_SITE_OTHER): Payer: Self-pay | Admitting: Gastroenterology

## 2019-10-22 ENCOUNTER — Other Ambulatory Visit (INDEPENDENT_AMBULATORY_CARE_PROVIDER_SITE_OTHER): Payer: Self-pay | Admitting: *Deleted

## 2019-10-22 DIAGNOSIS — R188 Other ascites: Secondary | ICD-10-CM

## 2019-10-22 DIAGNOSIS — K7460 Unspecified cirrhosis of liver: Secondary | ICD-10-CM

## 2019-10-22 LAB — CBC WITH DIFFERENTIAL/PLATELET
Absolute Monocytes: 571 cells/uL (ref 200–950)
Basophils Absolute: 34 cells/uL (ref 0–200)
Basophils Relative: 0.4 %
Eosinophils Absolute: 34 cells/uL (ref 15–500)
Eosinophils Relative: 0.4 %
HCT: 41.2 % (ref 35.0–45.0)
Hemoglobin: 12.7 g/dL (ref 11.7–15.5)
Lymphs Abs: 722 cells/uL — ABNORMAL LOW (ref 850–3900)
MCH: 27.1 pg (ref 27.0–33.0)
MCHC: 30.8 g/dL — ABNORMAL LOW (ref 32.0–36.0)
MCV: 88 fL (ref 80.0–100.0)
MPV: 10.9 fL (ref 7.5–12.5)
Monocytes Relative: 6.8 %
Neutro Abs: 7039 cells/uL (ref 1500–7800)
Neutrophils Relative %: 83.8 %
Platelets: 207 10*3/uL (ref 140–400)
RBC: 4.68 10*6/uL (ref 3.80–5.10)
RDW: 15.9 % — ABNORMAL HIGH (ref 11.0–15.0)
Total Lymphocyte: 8.6 %
WBC: 8.4 10*3/uL (ref 3.8–10.8)

## 2019-10-22 LAB — COMPLETE METABOLIC PANEL WITH GFR
AG Ratio: 1 (calc) (ref 1.0–2.5)
ALT: 30 U/L — ABNORMAL HIGH (ref 6–29)
AST: 43 U/L — ABNORMAL HIGH (ref 10–35)
Albumin: 3 g/dL — ABNORMAL LOW (ref 3.6–5.1)
Alkaline phosphatase (APISO): 129 U/L (ref 37–153)
BUN: 13 mg/dL (ref 7–25)
CO2: 24 mmol/L (ref 20–32)
Calcium: 9.3 mg/dL (ref 8.6–10.4)
Chloride: 107 mmol/L (ref 98–110)
Creat: 0.62 mg/dL (ref 0.60–0.88)
GFR, Est African American: 96 mL/min/{1.73_m2} (ref >=60)
GFR, Est Non African American: 83 mL/min/{1.73_m2} (ref >=60)
Globulin: 3.1 g/dL (calc) (ref 1.9–3.7)
Glucose, Bld: 93 mg/dL (ref 65–139)
Potassium: 4.8 mmol/L (ref 3.5–5.3)
Sodium: 143 mmol/L (ref 135–146)
Total Bilirubin: 1 mg/dL (ref 0.2–1.2)
Total Protein: 6.1 g/dL (ref 6.1–8.1)

## 2019-10-22 LAB — IRON,TIBC AND FERRITIN PANEL
%SAT: 8 % (calc) — ABNORMAL LOW (ref 16–45)
Ferritin: 74 ng/mL (ref 16–288)
Iron: 31 ug/dL — ABNORMAL LOW (ref 45–160)
TIBC: 401 mcg/dL (calc) (ref 250–450)

## 2019-10-22 LAB — TSH+FREE T4: TSH W/REFLEX TO FT4: 6.06 mIU/L — ABNORMAL HIGH (ref 0.40–4.50)

## 2019-10-22 LAB — PROTIME-INR
INR: 1.2 — ABNORMAL HIGH
Prothrombin Time: 12.1 s — ABNORMAL HIGH (ref 9.0–11.5)

## 2019-10-22 LAB — T4, FREE: Free T4: 0.9 ng/dL (ref 0.8–1.8)

## 2019-10-22 MED ORDER — SPIRONOLACTONE 50 MG PO TABS: 50.0000 mg | ORAL_TABLET | Freq: Every day | ORAL | 1 refills | Status: DC

## 2019-10-22 MED ORDER — FUROSEMIDE 20 MG PO TABS: 20.0000 mg | ORAL_TABLET | Freq: Every day | ORAL | 1 refills | Status: DC

## 2019-10-22 NOTE — Telephone Encounter (Signed)
Repeat lab order entered for BMP after starting diuretics - to be drawn in 1-2 weeks, daughter aware. Thanks.

## 2019-10-22 NOTE — Telephone Encounter (Signed)
Lab order has been released. Patient should be able to go to the lab on 10/29/2019 and have drawn.

## 2019-10-22 NOTE — Addendum Note (Signed)
Addended by: Tawni Pummel A on: 10/22/2019 02:27 PM   Modules accepted: Orders

## 2019-10-28 ENCOUNTER — Other Ambulatory Visit: Payer: Self-pay | Admitting: *Deleted

## 2019-10-28 DIAGNOSIS — I10 Essential (primary) hypertension: Secondary | ICD-10-CM

## 2019-10-28 NOTE — Patient Outreach (Signed)
Per Patient [name] member has had multiple ED visits/hospitalizations. Per Patient [name] member has had 5 ED/hospitalizations within the past 6 months.  Member screened for potential [facility] Care Management needs as a benefit of [employer].   Telephone call made to [name] at [phone]. No answer. HIPAA compliant voicemail message left.   Will make referral to [facility] Care Management RNCM due to ED/hospitalization frequency per Patient [name].   [name], MSN-Ed, RN,BSN [facility] Post Acute Care Coordinator [phone] [facility]) [phone]  (Toll free office)

## 2019-10-30 ENCOUNTER — Telehealth (INDEPENDENT_AMBULATORY_CARE_PROVIDER_SITE_OTHER): Payer: Self-pay | Admitting: Gastroenterology

## 2019-10-30 ENCOUNTER — Other Ambulatory Visit: Payer: Self-pay | Admitting: *Deleted

## 2019-10-30 NOTE — Patient Outreach (Signed)
Referral received post acute care coordinator for [employer], pt with 5 ED/ hospitalizations within 6 months, diagnoses cirrhosis of the liver with ascites, depression, HTN, acute on chronic anemia, history GI bleed, hypothyroidism, chronic diarrhea,  Outreach call to pt for screening, female answered and states she is patient's daughter and pt "is not available right now". RN CM ask if pt is able to give permission for RN CM to speak with daughter and per daughter "no, she cannot" .  RN CM mailed unsuccessful outreach letter to pt home.  PLAN Outreach pt in 3-4 business days  [employer], BSN [facility] Care Coordinator [phone]

## 2019-10-30 NOTE — Telephone Encounter (Signed)
Patients daughter Lupita Leash) called stated patient is not doing well with her breathing - would like a call back at [phone]

## 2019-10-31 ENCOUNTER — Emergency Department (HOSPITAL_COMMUNITY): Payer: Medicare Other

## 2019-10-31 ENCOUNTER — Inpatient Hospital Stay (HOSPITAL_COMMUNITY)
Admission: EM | Admit: 2019-10-31 | Discharge: 2019-11-05 | DRG: 291 | Disposition: A | Discharge status: Home Health | Payer: Medicare Other | Attending: Internal Medicine | Admitting: Internal Medicine

## 2019-10-31 ENCOUNTER — Encounter (INDEPENDENT_AMBULATORY_CARE_PROVIDER_SITE_OTHER): Payer: Self-pay | Admitting: Gastroenterology

## 2019-10-31 ENCOUNTER — Encounter (HOSPITAL_COMMUNITY): Payer: Self-pay | Admitting: *Deleted

## 2019-10-31 ENCOUNTER — Ambulatory Visit (INDEPENDENT_AMBULATORY_CARE_PROVIDER_SITE_OTHER): Payer: Medicare Other | Admitting: Gastroenterology

## 2019-10-31 ENCOUNTER — Other Ambulatory Visit: Payer: Self-pay

## 2019-10-31 VITALS — BP 143/88 | HR 97 | Temp 97.5°F | Ht 60.0 in | Wt 132.6 lb

## 2019-10-31 DIAGNOSIS — M3500 Sicca syndrome, unspecified: Secondary | ICD-10-CM | POA: Diagnosis present

## 2019-10-31 DIAGNOSIS — J9601 Acute respiratory failure with hypoxia: Secondary | ICD-10-CM | POA: Diagnosis present

## 2019-10-31 DIAGNOSIS — K7460 Unspecified cirrhosis of liver: Secondary | ICD-10-CM | POA: Diagnosis present

## 2019-10-31 DIAGNOSIS — E8809 Other disorders of plasma-protein metabolism, not elsewhere classified: Secondary | ICD-10-CM | POA: Diagnosis present

## 2019-10-31 DIAGNOSIS — Z20822 Contact with and (suspected) exposure to covid-19: Secondary | ICD-10-CM | POA: Diagnosis present

## 2019-10-31 DIAGNOSIS — I272 Pulmonary hypertension, unspecified: Secondary | ICD-10-CM | POA: Diagnosis present

## 2019-10-31 DIAGNOSIS — R0602 Shortness of breath: Secondary | ICD-10-CM | POA: Diagnosis not present

## 2019-10-31 DIAGNOSIS — J810 Acute pulmonary edema: Secondary | ICD-10-CM | POA: Diagnosis not present

## 2019-10-31 DIAGNOSIS — I509 Heart failure, unspecified: Secondary | ICD-10-CM

## 2019-10-31 DIAGNOSIS — K219 Gastro-esophageal reflux disease without esophagitis: Secondary | ICD-10-CM | POA: Diagnosis present

## 2019-10-31 DIAGNOSIS — R601 Generalized edema: Secondary | ICD-10-CM

## 2019-10-31 DIAGNOSIS — I110 Hypertensive heart disease with heart failure: Secondary | ICD-10-CM | POA: Diagnosis present

## 2019-10-31 DIAGNOSIS — I5033 Acute on chronic diastolic (congestive) heart failure: Secondary | ICD-10-CM | POA: Diagnosis present

## 2019-10-31 DIAGNOSIS — R4182 Altered mental status, unspecified: Secondary | ICD-10-CM | POA: Diagnosis not present

## 2019-10-31 DIAGNOSIS — E039 Hypothyroidism, unspecified: Secondary | ICD-10-CM | POA: Diagnosis present

## 2019-10-31 DIAGNOSIS — K766 Portal hypertension: Secondary | ICD-10-CM | POA: Diagnosis present

## 2019-10-31 DIAGNOSIS — Z79899 Other long term (current) drug therapy: Secondary | ICD-10-CM

## 2019-10-31 DIAGNOSIS — R188 Other ascites: Secondary | ICD-10-CM

## 2019-10-31 DIAGNOSIS — R0600 Dyspnea, unspecified: Secondary | ICD-10-CM

## 2019-10-31 DIAGNOSIS — R000 Tachycardia, unspecified: Secondary | ICD-10-CM | POA: Diagnosis not present

## 2019-10-31 HISTORY — DX: Heart failure, unspecified: I50.9

## 2019-10-31 LAB — COMPREHENSIVE METABOLIC PANEL
ALT: 39 U/L (ref 0–44)
AST: 53 U/L — ABNORMAL HIGH (ref 15–41)
Albumin: 2.9 g/dL — ABNORMAL LOW (ref 3.5–5.0)
Alkaline Phosphatase: 128 U/L — ABNORMAL HIGH (ref 38–126)
Anion gap: 13 (ref 5–15)
BUN: 13 mg/dL (ref 8–23)
CO2: 21 mmol/L — ABNORMAL LOW (ref 22–32)
Calcium: 8.8 mg/dL — ABNORMAL LOW (ref 8.9–10.3)
Chloride: 103 mmol/L (ref 98–111)
Creatinine, Ser: 0.74 mg/dL (ref 0.44–1.00)
GFR calc Af Amer: >60 mL/min (ref >60)
GFR calc non Af Amer: >60 mL/min (ref >60)
Glucose, Bld: 84 mg/dL (ref 70–99)
Potassium: 3.2 mmol/L — ABNORMAL LOW (ref 3.5–5.1)
Sodium: 137 mmol/L (ref 135–145)
Total Bilirubin: 1.2 mg/dL (ref 0.3–1.2)
Total Protein: 6.9 g/dL (ref 6.5–8.1)

## 2019-10-31 LAB — TROPONIN I (HIGH SENSITIVITY)
Troponin I (High Sensitivity): 31 ng/L — ABNORMAL HIGH (ref <18)
Troponin I (High Sensitivity): 27 ng/L — ABNORMAL HIGH (ref <18)

## 2019-10-31 LAB — CBC
HCT: 48.3 % — ABNORMAL HIGH (ref 36.0–46.0)
Hemoglobin: 14.7 g/dL (ref 12.0–15.0)
MCH: 28 pg (ref 26.0–34.0)
MCHC: 30.4 g/dL (ref 30.0–36.0)
MCV: 92 fL (ref 80.0–100.0)
Platelets: 225 10*3/uL (ref 150–400)
RBC: 5.25 MIL/uL — ABNORMAL HIGH (ref 3.87–5.11)
RDW: 21.6 % — ABNORMAL HIGH (ref 11.5–15.5)
WBC: 7.6 10*3/uL (ref 4.0–10.5)
nRBC: 0 % (ref 0.0–0.2)

## 2019-10-31 LAB — MAGNESIUM: Magnesium: 1.9 mg/dL (ref 1.7–2.4)

## 2019-10-31 LAB — BRAIN NATRIURETIC PEPTIDE: B Natriuretic Peptide: 1645 pg/mL — ABNORMAL HIGH (ref 0.0–100.0)

## 2019-10-31 LAB — PROCALCITONIN: Procalcitonin: <0.1 ng/mL

## 2019-10-31 MED ORDER — ACETAMINOPHEN 325 MG PO TABS
650.0000 mg | ORAL_TABLET | Freq: Four times a day (QID) | ORAL | Status: DC | PRN
  Administered 2019-11-02 – 2019-11-03 (×2): 650 mg via ORAL
  Filled 2019-10-31 (×2): qty 2

## 2019-10-31 MED ORDER — POTASSIUM CHLORIDE CRYS ER 20 MEQ PO TBCR
40.0000 meq | EXTENDED_RELEASE_TABLET | Freq: Once | ORAL | Status: AC
  Administered 2019-10-31: 22:00:00 40 meq via ORAL
  Filled 2019-10-31: qty 2

## 2019-10-31 MED ORDER — POLYETHYLENE GLYCOL 3350 17 G PO PACK: 17.0000 g | PACK | Freq: Every day | ORAL | Status: DC | PRN

## 2019-10-31 MED ORDER — SERTRALINE HCL 50 MG PO TABS
25.0000 mg | ORAL_TABLET | Freq: Every morning | ORAL | Status: DC
  Administered 2019-11-01 – 2019-11-05 (×5): 25 mg via ORAL
  Filled 2019-10-31 (×5): qty 1

## 2019-10-31 MED ORDER — FUROSEMIDE 10 MG/ML IJ SOLN
40.0000 mg | Freq: Two times a day (BID) | INTRAMUSCULAR | Status: DC
  Administered 2019-11-01 – 2019-11-05 (×9): 40 mg via INTRAVENOUS
  Filled 2019-10-31 (×9): qty 4

## 2019-10-31 MED ORDER — FUROSEMIDE 10 MG/ML IJ SOLN
60.0000 mg | Freq: Once | INTRAMUSCULAR | Status: AC
  Administered 2019-10-31: 19:00:00 60 mg via INTRAVENOUS
  Filled 2019-10-31: qty 6

## 2019-10-31 MED ORDER — POTASSIUM CHLORIDE CRYS ER 20 MEQ PO TBCR
20.0000 meq | EXTENDED_RELEASE_TABLET | Freq: Once | ORAL | Status: AC
  Administered 2019-10-31: 19:00:00 20 meq via ORAL
  Filled 2019-10-31: qty 1

## 2019-10-31 MED ORDER — ENOXAPARIN SODIUM 40 MG/0.4ML ~~LOC~~ SOLN
40.0000 mg | SUBCUTANEOUS | Status: DC
  Administered 2019-10-31 – 2019-11-04 (×5): 40 mg via SUBCUTANEOUS
  Filled 2019-10-31 (×5): qty 0.4

## 2019-10-31 MED ORDER — SPIRONOLACTONE 25 MG PO TABS
50.0000 mg | ORAL_TABLET | Freq: Every day | ORAL | Status: DC
  Administered 2019-11-01 – 2019-11-05 (×5): 50 mg via ORAL
  Filled 2019-10-31 (×5): qty 2
  Filled 2019-10-31: qty 1

## 2019-10-31 MED ORDER — SODIUM CHLORIDE 0.9% FLUSH
3.0000 mL | INTRAVENOUS | Status: DC | PRN
  Administered 2019-11-03: 09:00:00 3 mL via INTRAVENOUS

## 2019-10-31 MED ORDER — ONDANSETRON HCL 4 MG/2ML IJ SOLN: 4.0000 mg | Freq: Four times a day (QID) | INTRAMUSCULAR | Status: DC | PRN

## 2019-10-31 MED ORDER — PANTOPRAZOLE SODIUM 40 MG PO TBEC
40.0000 mg | DELAYED_RELEASE_TABLET | Freq: Every day | ORAL | Status: DC
  Administered 2019-10-31 – 2019-11-05 (×6): 40 mg via ORAL
  Filled 2019-10-31 (×6): qty 1

## 2019-10-31 MED ORDER — THYROID 60 MG PO TABS
180.0000 mg | ORAL_TABLET | Freq: Every day | ORAL | Status: DC
  Filled 2019-10-31 (×4): qty 1

## 2019-10-31 MED ORDER — POTASSIUM CHLORIDE CRYS ER 20 MEQ PO TBCR: 20.0000 meq | EXTENDED_RELEASE_TABLET | Freq: Once | ORAL | Status: DC

## 2019-10-31 MED ORDER — ACETAMINOPHEN 650 MG RE SUPP: 650.0000 mg | Freq: Four times a day (QID) | RECTAL | Status: DC | PRN

## 2019-10-31 MED ORDER — ONDANSETRON HCL 4 MG PO TABS: 4.0000 mg | ORAL_TABLET | Freq: Four times a day (QID) | ORAL | Status: DC | PRN

## 2019-10-31 NOTE — H&P (Signed)
History and Physical    [name] OMV:[id number] DOB: 1933/05/19 DOA: 10/31/2019  PCP: Assunta Found, MD   Patient coming from: Home  I have personally briefly reviewed patient's old medical records in [facility]  Chief Complaint: SOB, swelling  HPI: [name] is a 86 y.o. female with medical history significant for liver cirrhosis, portal hypertension, GI bleed, CHF, hypertension, Sjogren's. Patient was sent to the ED from gastroenterologist office, reports of difficulty breathing of 2 weeks duration, generalized swelling involving her abdomen and legs.  She denies chest pain.  Difficulty breathing is only with exertion, not present at rest.  No cough.  No fevers or chills.  Patient reports compliance with her medications which includes Lasix 20 mg and spironolactone.  She also reports compliance with a low-salt diet.  She denies black stools or blood in her stools.  4 recent hospitalizations for symptomatic anemia due to diverticular GI bleed requiring transfusions Nov 2020 - Dec/ 2020.  ED Course: O2 sats 87% on room air, improved with nasal cannula to 94%.  Tachypneic.  BNP 1645.  Potassium 3.2.  At bedtime troponin 31.  Chest x-ray shows vascular congestion, bibasilar atelectasis or infiltrates.  Small bilateral pleural effusions.  IV Lasix 60 mg x 1 given in ED.  Potassium supplements given.  Hospitalist admit for further evaluation and management.  Review of Systems: As per HPI all other systems reviewed and negative.  Past Medical History:  Diagnosis Date  . CHF (congestive heart failure) (HCC)   . Cirrhosis (HCC)   . GERD (gastroesophageal reflux disease)   . GI bleed   . Hypertension   . Sjogren's disease (HCC)   . Thyroid disease    hypothyroidism    Past Surgical History:  Procedure Laterality Date  . COLONOSCOPY WITH ESOPHAGOGASTRODUODENOSCOPY (EGD) AND ESOPHAGEAL DILATION (ED)     years ago  . COLONOSCOPY WITH PROPOFOL N/A 08/30/2019   Procedure: COLONOSCOPY WITH PROPOFOL;  Surgeon: Malissa Hippo, MD;  [facility];  Service: Endoscopy;  Laterality: N/A;  . complete hysterectomy    . ESOPHAGOGASTRODUODENOSCOPY (EGD) WITH PROPOFOL N/A 08/30/2019   Procedure: ESOPHAGOGASTRODUODENOSCOPY (EGD) WITH PROPOFOL;  Surgeon: Malissa Hippo, MD;  [facility];  Service: Endoscopy;  Laterality: N/A;  . left rotator cuff       reports that she has never smoked. She has never used smokeless tobacco. She reports that she does not drink alcohol or use drugs.  Allergies  Allergen Reactions  . Ciprofloxacin Other (See Comments)    NO REACTION LISTED  . Codeine     Unknown ( per daughter)  . Demerol     Unknown ( per daughter)  . Penicillins     Did it involve swelling of the face/tongue/throat, SOB, or low BP? Unknown Did it involve sudden or severe rash/hives, skin peeling, or any reaction on the inside of your mouth or nose? Unknown Did you need to seek medical attention at a hospital or doctor's office? Yes When did it last happen?Uknown If all above answers are "NO", may proceed with cephalosporin use. No issue with ceftriaxone  . Sulfa Antibiotics     Unknown ( per daughter)  . Trazodone And Nefazodone Other (See Comments)    Confusion and hallucinations    Family History  Problem Relation Age of Onset  . Colon cancer Neg Hx   . Colon polyps Neg Hx     Prior to Admission medications   Medication Sig Start  Date End Date Taking? Authorizing Provider  Ascorbic Acid (VITAMIN C) 500 MG CAPS Take by mouth daily.   Yes [provider]  Calcium-Magnesium-Zinc [phone] MG TABS Take 1 tablet by mouth every morning.    Yes [provider]  Cholecalciferol (VITAMIN D3) 10 MCG (400 UNIT) CAPS Take 1,000 mg by mouth daily.    Yes [provider]  co-enzyme Q-10 30 MG capsule Take 30 mg by mouth daily.    Yes [provider]  esomeprazole (NEXIUM) 20 MG capsule Take  1 capsule (20 mg total) by mouth at bedtime. 09/01/19  Yes Erick Blinks, MD  Ferrous Sulfate (IRON) 325 (65 Fe) MG TABS Take 1 tablet (325 mg total) by mouth 2 (two) times daily before lunch and supper. 09/18/19  Yes Narda Fundora, Courage, MD  furosemide (LASIX) 20 MG tablet Take 1 tablet (20 mg total) by mouth daily. 10/22/19  Yes Tawni Pummel A, PA-C  loperamide (IMODIUM) 2 MG capsule Take 2 mg by mouth See admin instructions. 1 tablet in the morning and 2 tablets at bedtime   Yes [provider]  Multiple Vitamin (MULTIVITAMIN WITH MINERALS) TABS tablet Take 1 tablet by mouth every morning.   Yes [provider]  Omega-3 Fatty Acids (FISH OIL PO) Take 1 capsule by mouth daily.   Yes [provider]  sertraline (ZOLOFT) 25 MG tablet Take 25 mg by mouth every morning.    Yes [provider]  Simethicone (GAS-X PO) Take 2 tablets by mouth every evening.    Yes [provider]  spironolactone (ALDACTONE) 50 MG tablet Take 1 tablet (50 mg total) by mouth daily. 10/22/19  Yes Levonne Hubert, PA-C  thyroid (ARMOUR) 180 MG tablet Take 180 mg by mouth daily before breakfast.    Yes [provider]  Turmeric 400 MG CAPS Take 400 mg by mouth every morning.    Yes [provider]  vitamin B-12 (CYANOCOBALAMIN) 250 MCG tablet Take 250 mcg by mouth every morning.   Yes [provider]  Wheat Dextrin (BENEFIBER DRINK MIX PO) Take 1 packet by mouth daily. With breakfast   Yes [provider]    Physical Exam: Vitals:   10/31/19 1730 10/31/19 1800 10/31/19 1830 10/31/19 1917  BP: (!) 150/90 (!) 140/92 (!) 144/89   Pulse: 90 86 90 94  Resp: (!) 22 (!) 21 (!) 21 17  Temp:      TempSrc:      SpO2: (!) 87% (!) 88% (!) 88% 94%  Weight:      Height:        Constitutional: Voice sounds faint, patient states this occurs whenever she is admitted to the hospital, frail-appearing otherwise , calm, comfortable Vitals:   10/31/19 1730  10/31/19 1800 10/31/19 1830 10/31/19 1917  BP: (!) 150/90 (!) 140/92 (!) 144/89   Pulse: 90 86 90 94  Resp: (!) 22 (!) 21 (!) 21 17  Temp:      TempSrc:      SpO2: (!) 87% (!) 88% (!) 88% 94%  Weight:      Height:       Eyes: PERRL, lids and conjunctivae normal ENMT: Mucous membranes are moist. Posterior pharynx clear of any exudate or lesions.  Neck: normal, supple, no masses, no thyromegaly Respiratory:  Normal respiratory effort. No accessory muscle use.  Cardiovascular: Regular rate and rhythm, 2+ pitting pedal edema extending upwards towards thighs. No extremity edema. 2+ pedal pulses.  Abdomen: Abdomen full but not  tense though obviously distended, no tenderness, no masses palpated. No hepatosplenomegaly. Bowel sounds positive.  Musculoskeletal: no clubbing / cyanosis. No joint deformity upper and lower extremities. Good ROM, no contractures. Normal muscle tone.  Skin: no rashes, lesions, ulcers. No induration Neurologic: CN 2-12 grossly intact. Strength 5/5 in all 4.  Psychiatric: Normal judgment and insight. Alert and oriented x 3. Normal mood.   Labs on Admission: I have personally reviewed following labs and imaging studies  CBC: Recent Labs  Lab 10/31/19 1808  WBC 7.6  HGB 14.7  HCT 48.3*  MCV 92.0  PLT 341   Basic Metabolic Panel: Recent Labs  Lab 10/31/19 1808  NA 137  K 3.2*  CL 103  CO2 21*  GLUCOSE 84  BUN 13  CREATININE 0.74  CALCIUM 8.8*  MG 1.9   Liver Function Tests: Recent Labs  Lab 10/31/19 1808  AST 53*  ALT 39  ALKPHOS 128*  BILITOT 1.2  PROT 6.9  ALBUMIN 2.9*    Radiological Exams on Admission: DG Chest 2 View  Result Date: 10/31/2019 CLINICAL DATA:  Shortness of breath EXAM: CHEST - 2 VIEW COMPARISON:  09/17/2019 FINDINGS: Cardiomegaly with vascular congestion. Atelectasis in the right mid lung. Bibasilar opacities could reflect atelectasis or infiltrates. Suspect small effusions. No acute bony abnormality. IMPRESSION:  Cardiomegaly, vascular congestion. Small bilateral effusions. Right mid lung atelectasis.  Bibasilar atelectasis or infiltrates. Electronically Signed   By: Rolm Baptise M.D.   On: 10/31/2019 18:39    EKG: Independently reviewed.  Sinus rhythm, with premature ventricular contractions.  QTc 480.  No significant change from prior EKG.  Assessment/Plan Active Problems:   Decompensated heart failure (HCC)   Decompensated diastolic heart failure- per chart, since December, patient has had a 30 pound weight gain.  Dyspnea on exertion, anasarca.  BNP elevated at 1645, similar elevations a month prior.  Chest x-ray with vascular congestion, small pleural effusions.  Also bibasilar atelectasis or infiltrates.  Afebrile, without leukocytosis.  Doubt pneumonia, presentation most suggestive of volume overload.  Recent echo 09/06/2019 EF 50 to 55%, G2DD, moderate mitral valve antroscopy valve regurgitation, mild to moderate AV regurgitation.  Moderate pulmonary hypertension PASP 53.. -Trend Hs Troponin -Check procalcitonin -IV Lasix 60 mg given in ED, continue 40 mg every 12 hourly -Strict input output, daily weights -Fluid restriction -Daily BMP -Resume home spironolactone 50 mg daily  Liver cirrhosis- felt to be cryptogenic of Sjogren's with history of portal hypertension.  Hypoalbuminemia of 2.9 likely contributing to anasarca. -Continue home spironolactone -Diuresis with IV Lasix  History of GI bleed-multiple recent hospitalization for acute anemia.  Remarkably her hemoglobin today is 14.7.  Denies melena or blood in stools.  Hypothyroidism-  -Resume home Synthroid  Hypertension - IV Lasix and resume home spironolactone   DVT prophylaxis: Lovenox Code Status: Full code, confirmed with patient at bedside.  Family Communication: None at bedside.  Plan of care explained, patient verbalized understanding.  Patient's daughter [name] is healthcare power of attorney. Disposition Plan: > 2  days Consults called: None Admission status: Inpatient, tele I certify that at the point of admission it is my clinical judgment that the patient will require inpatient hospital care spanning beyond 2 midnights from the point of admission due to high intensity of service, high risk for further deterioration and high frequency of surveillance required. The following factors support the patient status of inpatient: Volume overload requiring IV diuresis and close monitoring.   Bethena Roys MD [facility]  10/31/2019, 9:43  PM    

## 2019-10-31 NOTE — ED Triage Notes (Signed)
Pt with sob for past 2 weeks, fluid retention all over as well per pt.

## 2019-10-31 NOTE — Progress Notes (Signed)
Patient profile: [name] is a 86 y.o. female seen today acutely.  She was coming to our office to get a repeat BMP after starting Lasix 20 mg and Aldactone 50 mg about 10 days ago.  Daughter noted significantly worsening symptoms and she was seen for office visit.   History of Present Illness: [name] has past medical history of newly diagnosed cirrhosis felt cryptogenic or Sjogren's, ultimately was diagnosed 08/2019 when she was admitted for hemoglobin of 5.3 on a CT scan showing portal hypertension and ascites. Also noted to have BNP 1760 that admission, has not seen cardiology as outpatient.   She was seen in our clinic on 10/21/2019 for hospital f/up, at that visit was noted to have LE edema, some mild ascites felt of short of breath due to her swelling.  She was started on diuretics as above.  She is seen today and daughter reports she is significantly worse.  She noted increased urination for 1 to 2 days after starting lasix/aldactone but over past 1 week has not had increased urine output.  She is eating just a few bites of food each meal.  She has continued to gain weight-she is up a total of approximately 30 pounds.  She has dyspnea and unable to ambulate more than a few steps without sitting down for several minutes. She feels bloating and swelling but has no cramping/stabbing abdominal pain.  She is having 3 bowel movements per night that are soft, no episodes of dark stools or rectal bleeding. Tends to have more BM at night than during the day which is her chronic baseline.   Wt Readings from Last 3 Encounters:  10/31/19 132 lb (59.9 kg)  10/31/19 132 lb 9.6 oz (60.1 kg)  10/21/19 129 lb (58.5 kg)     Last Colonoscopy: Nov 2020 decreased sphincter tone DRE, 2 nonbleeding occult colonic angina dysplastic lesions, diverticulosis sigmoid, congested mucosa rectum Last Endoscopy: November 2020-esophageal mucosal changes suspicious for short segment Barrett's, 4 cm hiatal  hernia, portal hypertensive gastropathy, congested duodenal mucosa   Past Medical History:  Past Medical History:  Diagnosis Date  . CHF (congestive heart failure) ([place])   . Cirrhosis ([place])   . GERD (gastroesophageal reflux disease)   . GI bleed   . Hypertension   . Sjogren's disease ([place])   . Thyroid disease    hypothyroidism    Problem List: Patient Active Problem List   Diagnosis Date Noted  . Acute on chronic anemia 09/17/2019  . Elevated lactic acid level 09/17/2019  . AKI (acute kidney injury) ([place]) 09/17/2019  . GI hemorrhage 09/09/2019  . Acute blood loss anemia 09/01/2019  . Lower GI bleed 09/01/2019  . Syncope 09/01/2019  . Hypothyroidism 09/01/2019  . Hypokalemia 09/01/2019  . Chronic diarrhea 09/01/2019  . Symptomatic anemia 08/27/2019  . Portal hypertension --- liver cirrhosis 08/27/2019  . Cirrhosis of liver with ascites and portal hypertension 08/27/2019  . Diarrhea 04/11/2019    Past Surgical History: Past Surgical History:  Procedure Laterality Date  . COLONOSCOPY WITH ESOPHAGOGASTRODUODENOSCOPY (EGD) AND ESOPHAGEAL DILATION (ED)     years ago  . COLONOSCOPY WITH PROPOFOL N/A 08/30/2019   Procedure: COLONOSCOPY WITH PROPOFOL;  Surgeon: Rogene Houston, MD;  [facility];  Service: Endoscopy;  Laterality: N/A;  . complete hysterectomy    . ESOPHAGOGASTRODUODENOSCOPY (EGD) WITH PROPOFOL N/A 08/30/2019   Procedure: ESOPHAGOGASTRODUODENOSCOPY (EGD) WITH PROPOFOL;  Surgeon: Rogene Houston, MD;  [facility];  Service:  Endoscopy;  Laterality: N/A;  . left rotator cuff      Allergies: Allergies  Allergen Reactions  . Ciprofloxacin Other (See Comments)    NO REACTION LISTED  . Ciprocin-Fluocin-Procin [Fluocinolone Acetonide]   . Codeine     Unknown ( per daughter)  . Demerol     Unknown ( per daughter)  . Penicillins     Did it involve swelling of the face/tongue/throat, SOB, or low BP? Unknown Did it involve sudden or severe  rash/hives, skin peeling, or any reaction on the inside of your mouth or nose? Unknown Did you need to seek medical attention at a hospital or doctor's office? Yes When did it last happen?Uknown If all above answers are "NO", may proceed with cephalosporin use. No issue with ceftriaxone  . Sulfa Antibiotics     Unknown ( per daughter)  . Trazodone And Nefazodone Other (See Comments)    Confusion and hallucinations      Home Medications:  Current Outpatient Medications:  .  Ascorbic Acid (VITAMIN C) 500 MG CAPS, Take by mouth daily., Disp: , Rfl:  .  Calcium-Magnesium-Zinc 333-133-5 MG TABS, Take 1 tablet by mouth every morning. , Disp: , Rfl:  .  esomeprazole (NEXIUM) 20 MG capsule, Take 1 capsule (20 mg total) by mouth at bedtime., Disp: , Rfl:  .  Ferrous Sulfate (IRON) 325 (65 Fe) MG TABS, Take 1 tablet (325 mg total) by mouth 2 (two) times daily before lunch and supper., Disp: 30 tablet, Rfl: 0 .  furosemide (LASIX) 20 MG tablet, Take 1 tablet (20 mg total) by mouth daily., Disp: 30 tablet, Rfl: 1 .  loperamide (IMODIUM) 2 MG capsule, Take 2 mg by mouth daily as needed for diarrhea or loose stools. , Disp: , Rfl:  .  Multiple Vitamin (MULTIVITAMIN WITH MINERALS) TABS tablet, Take 1 tablet by mouth every morning., Disp: , Rfl:  .  sertraline (ZOLOFT) 25 MG tablet, Take 25 mg by mouth every morning. , Disp: , Rfl:  .  Simethicone (GAS-X PO), Take by mouth. Per daughter she is taking 2 twice a day for bloating, Disp: , Rfl:  .  spironolactone (ALDACTONE) 50 MG tablet, Take 1 tablet (50 mg total) by mouth daily., Disp: 30 tablet, Rfl: 1 .  thyroid (ARMOUR) 120 MG tablet, Take 180 mg by mouth daily before breakfast. , Disp: , Rfl:  .  vitamin B-12 (CYANOCOBALAMIN) 250 MCG tablet, Take 250 mcg by mouth every morning., Disp: , Rfl:  .  Wheat Dextrin (BENEFIBER DRINK MIX PO), Take 1 packet by mouth daily. With breakfast, Disp: , Rfl:  .  Cholecalciferol (VITAMIN D3) 10 MCG (400 UNIT)  CAPS, Take 1,000 mg by mouth daily. , Disp: , Rfl:  .  co-enzyme Q-10 30 MG capsule, Take 30 mg by mouth daily. , Disp: , Rfl:  .  Omega-3 Fatty Acids (FISH OIL PO), Take 1 capsule by mouth daily., Disp: , Rfl:  .  Turmeric 400 MG CAPS, Take 400 mg by mouth every morning. , Disp: , Rfl:    Family History: No fam hx colon polyps or colon cancer   Social History:   reports that she has never smoked. She has never used smokeless tobacco. She reports that she does not drink alcohol or use drugs.   Review of Systems: Constitutional:+Weight gain  Eyes: No changes in vision. ENT: No oral lesions, sore throat.  GI: see HPI.  Heme/Lymph: No easy bruising.  CV: No chest pain.  GU: No  hematuria.  Integumentary: No rashes.  Neuro: No headaches.  Psych: No depression/anxiety.  Endocrine: No heat/cold intolerance.  Allergic/Immunologic: No urticaria.  Resp: + SOB.  Musculoskeletal: + edema.    Physical Examination: BP (!) 143/88 (BP Location: Right Arm, Patient Position: Sitting, Cuff Size: Small)   Pulse 97   Temp (!) 97.5 F (36.4 C) (Temporal)   Ht 5' (1.524 m)   Wt 132 lb 9.6 oz (60.1 kg)   BMI 25.90 kg/m  Gen: NAD, alert and oriented x 4. In wheelchair. Becomes SOB with standing for weight.  HEENT: PEERLA, EOMI, Neck: supple, no JVD Chest: +crackles bilateral lung bases  CV: irregular rhythm, no m/g/c/r Abd: soft, NT,+ascites but not tense abdominal ascites  Ext: pitting edema bilaterally to thighs Skin: no rash or lesions noted on observed skin Lymph: no noted LAD  Data Reviewed:  IMPRESSION: Ultrasound November 2020 1. Cirrhosis of the liver. Ascites is identified compatible with portal venous hypertension. 2. Gallbladder wall thickening. In the setting of portal venous hypertension and ascites this may be a nonspecific finding. No gallstones or sonographic Murphy's sign noted. 3. Ascites, bilateral pleural effusions. 4. Aortic Atherosclerosis   Cirrhosis  serologic w/up (in patient 08/2019) with negative ANA, negative celiac panel, normal immunoglobulin panel and AMA. APF normal. Immune to hep A&B  ECHO 08/2019--EF 50-55%-elevated left atrial pressure, grade 2 diastolic dysfunction  Labs at last OV - INR 1.2, CBC w/ Hgb 12.7, albumin 3.0, iron 31   Assessment/Plan: [name] is a 86 y.o. female seen today acutely for worsening shortness of breath, ascites, unable to ambulate more than 2-3 steps.  Patient likely has combination of cirrhosis and congestive heart failure-she is seen today with pitting edema up to her thighs, respiratory crackles, etc suspect fluid overloaded in CHF exacerbation. #30lb weight gain in 6 weeks.  She is not responding to low-dose outpatient diuretics.  Recommend she be admitted for labs, IV diuretic therapy, chest x-ray, likely repeat echo, etc. per hospitalist recommendations.  Can repeat ultrasound abdomen and consider paracentesis although abd ascites may not be significant enough to tap, no abd pain to suggest SBP.  Discussion w/ daughter and patient regarding treatment plan and need for close inpatient monitoring beyond outpatient capabilities. Patient's daughter will take her to the emergency room.  Patient also seen during visit by Dr. Karilyn Cota who discussed with ER physician.  >40 min total pt care time. >50% face to face   I personally performed the service, non-incident to. (WP)  Tawni Pummel, [facility]

## 2019-10-31 NOTE — ED Triage Notes (Signed)
Unable obtain O2 sats while in triage.

## 2019-10-31 NOTE — ED Provider Notes (Signed)
[facility] EMERGENCY DEPARTMENT Provider Note   CSN: [id number] Arrival date & time: 10/31/19  1637     History Chief Complaint  Patient presents with  . Shortness of Breath    SAANVIKA [name] is a 86 y.o. female.  HPI   I took the phone call from the patient's primary gastroenterologist Dr. Dionicia Peterson with a report that the patient had come to his office today increasingly short of breath.  The patient had recently been seen in the hospital, evaluated and diagnosed initially with cirrhosis and at that time had been started on spironolactone as well as Lasix.  During an admission to the hospital in November 2020 the patient had an echocardiogram showing grade 2 diastolic dysfunction with an ejection fraction of 50 to 55%.  There was some aortic regurgitation which was mild to moderate.  The patient had an admission to the hospital for gastrointestinal bleeding at the end of November and then again was admitted at the beginning of December, symptomatic anemia.  She went to the doctor today for follow-up, she states that she has been severely short of breath which has been progressive over the last month and notes approximately 30 pounds of weight gain over the last several weeks.  This patient states that this is been progressively worsening and is now become severe.  She denies any coughing but has had worsening orthopnea and severe exertional dyspnea.  She reports that her legs and abdomen are entirely swollen and distended despite the use of the medications as prescribed.  Past Medical History:  Diagnosis Date  . CHF (congestive heart failure) (HCC)   . Cirrhosis (HCC)   . GERD (gastroesophageal reflux disease)   . GI bleed   . Hypertension   . Sjogren's disease (HCC)   . Thyroid disease    hypothyroidism    Patient Active Problem List   Diagnosis Date Noted  . Decompensated heart failure (HCC) 10/31/2019  . Acute on chronic anemia 09/17/2019  . Elevated lactic acid level  09/17/2019  . AKI (acute kidney injury) (HCC) 09/17/2019  . GI hemorrhage 09/09/2019  . Acute blood loss anemia 09/01/2019  . Lower GI bleed 09/01/2019  . Syncope 09/01/2019  . Hypothyroidism 09/01/2019  . Hypokalemia 09/01/2019  . Chronic diarrhea 09/01/2019  . Symptomatic anemia 08/27/2019  . Portal hypertension --- liver cirrhosis 08/27/2019  . Cirrhosis of liver with ascites and portal hypertension 08/27/2019  . Diarrhea 04/11/2019    Past Surgical History:  Procedure Laterality Date  . COLONOSCOPY WITH ESOPHAGOGASTRODUODENOSCOPY (EGD) AND ESOPHAGEAL DILATION (ED)     years ago  . COLONOSCOPY WITH PROPOFOL N/A 08/30/2019   Procedure: COLONOSCOPY WITH PROPOFOL;  Surgeon: Julie Hippo, MD;  Location: [facility];  Service: Endoscopy;  Laterality: N/A;  . complete hysterectomy    . ESOPHAGOGASTRODUODENOSCOPY (EGD) WITH PROPOFOL N/A 08/30/2019   Procedure: ESOPHAGOGASTRODUODENOSCOPY (EGD) WITH PROPOFOL;  Surgeon: Julie Hippo, MD;  Location: [facility];  Service: Endoscopy;  Laterality: N/A;  . left rotator cuff       OB History   No obstetric history on file.     Family History  Problem Relation Age of Onset  . Colon cancer Neg Hx   . Colon polyps Neg Hx     Social History   Tobacco Use  . Smoking status: Never Smoker  . Smokeless tobacco: Never Used  Substance Use Topics  . Alcohol use: Never  . Drug use: Never    Home Medications Prior to  Admission medications   Medication Sig Start Date End Date Taking? Authorizing Provider  Ascorbic Acid (VITAMIN C) 500 MG CAPS Take by mouth daily.   Yes [provider]  Calcium-Magnesium-Zinc [phone] MG TABS Take 1 tablet by mouth every morning.    Yes [provider]  Cholecalciferol (VITAMIN D3) 10 MCG (400 UNIT) CAPS Take 1,000 mg by mouth daily.    Yes [provider]  co-enzyme Q-10 30 MG capsule Take 30 mg by mouth daily.    Yes [provider]  esomeprazole (NEXIUM)  20 MG capsule Take 1 capsule (20 mg total) by mouth at bedtime. 09/01/19  Yes Erick Blinks, MD  Ferrous Sulfate (IRON) 325 (65 Fe) MG TABS Take 1 tablet (325 mg total) by mouth 2 (two) times daily before lunch and supper. 09/18/19  Yes Emokpae, Courage, MD  furosemide (LASIX) 20 MG tablet Take 1 tablet (20 mg total) by mouth daily. 10/22/19  Yes Tawni Pummel A, PA-C  loperamide (IMODIUM) 2 MG capsule Take 2 mg by mouth See admin instructions. 1 tablet in the morning and 2 tablets at bedtime   Yes [provider]  Multiple Vitamin (MULTIVITAMIN WITH MINERALS) TABS tablet Take 1 tablet by mouth every morning.   Yes [provider]  Omega-3 Fatty Acids (FISH OIL PO) Take 1 capsule by mouth daily.   Yes [provider]  sertraline (ZOLOFT) 25 MG tablet Take 25 mg by mouth every morning.    Yes [provider]  Simethicone (GAS-X PO) Take 2 tablets by mouth every evening.    Yes [provider]  spironolactone (ALDACTONE) 50 MG tablet Take 1 tablet (50 mg total) by mouth daily. 10/22/19  Yes Levonne Hubert, PA-C  thyroid (ARMOUR) 180 MG tablet Take 180 mg by mouth daily before breakfast.    Yes [provider]  Turmeric 400 MG CAPS Take 400 mg by mouth every morning.    Yes [provider]  vitamin B-12 (CYANOCOBALAMIN) 250 MCG tablet Take 250 mcg by mouth every morning.   Yes [provider]  Wheat Dextrin (BENEFIBER DRINK MIX PO) Take 1 packet by mouth daily. With breakfast   Yes [provider]    Allergies    Ciprofloxacin, Codeine, Demerol, Penicillins, Sulfa antibiotics, and Trazodone and nefazodone  Review of Systems   Review of Systems  All other systems reviewed and are negative.   Physical Exam Updated Vital Signs BP (!) 144/89   Pulse 94   Temp 97.7 F (36.5 C) (Oral)   Resp 17   Ht 1.549 m (5\' 1" )   Wt 59.9 kg   SpO2 94%   BMI 24.94 kg/m   Physical Exam Constitutional:      General: She  is in acute distress.     Appearance: She is ill-appearing.  HENT:     Head: Normocephalic and atraumatic.     Mouth/Throat:     Comments: Dry mucous membranes Eyes:     Extraocular Movements: Extraocular movements intact.     Pupils: Pupils are equal, round, and reactive to light.  Neck:     Vascular: JVD present.  Cardiovascular:     Rate and Rhythm: Regular rhythm. Tachycardia present.  Pulmonary:     Effort: Accessory muscle usage present.     Comments: Rales are present in all lung fields, the patient has increased respiratory rate with mild accessory muscle use Abdominal:     Palpations: Abdomen is soft.     Comments:  The abdomen is distended and there is anasarca extending to the umbilicus  Musculoskeletal:     Cervical back: Normal range of motion.     Right lower leg: Edema present.     Left lower leg: Edema present.     Comments: 3-4+ pitting edema bilaterally symmetrical  Skin:    General: Skin is warm and dry.     Findings: No erythema.  Neurological:     General: No focal deficit present.     Mental Status: She is alert.     Comments: Other than being dyspneic and generally weak there is no focal weakness, speech is clear     ED Results / Procedures / Treatments   Labs (all labs ordered are listed, but only abnormal results are displayed) Labs Reviewed  BRAIN NATRIURETIC PEPTIDE - Abnormal; Notable for the following components:      Result Value   B Natriuretic Peptide 1,645.0 (*)    All other components within normal limits  CBC - Abnormal; Notable for the following components:   RBC 5.25 (*)    HCT 48.3 (*)    RDW 21.6 (*)    All other components within normal limits  COMPREHENSIVE METABOLIC PANEL - Abnormal; Notable for the following components:   Potassium 3.2 (*)    CO2 21 (*)    Calcium 8.8 (*)    Albumin 2.9 (*)    AST 53 (*)    Alkaline Phosphatase 128 (*)    All other components within normal limits  TROPONIN I (HIGH SENSITIVITY) -  Abnormal; Notable for the following components:   Troponin I (High Sensitivity) 31 (*)    All other components within normal limits  SARS CORONAVIRUS 2 (TAT 6-24 HRS)  MAGNESIUM    EKG EKG Interpretation  Date/Time:  Thursday October 31 2019 16:53:52 EST Ventricular Rate:  96 PR Interval:  174 QRS Duration: 86 QT Interval:  380 QTC Calculation: 480 R Axis:   4 Text Interpretation: Sinus rhythm with Premature supraventricular complexes and with occasional Premature ventricular complexes and Fusion complexes Low voltage QRS Possible Anterolateral infarct , age undetermined Abnormal ECG since last tracing no significant change Confirmed by Noemi Chapel [phone]) on 10/31/2019 5:02:13 PM   Radiology DG Chest 2 View  Result Date: 10/31/2019 CLINICAL DATA:  Shortness of breath EXAM: CHEST - 2 VIEW COMPARISON:  09/17/2019 FINDINGS: Cardiomegaly with vascular congestion. Atelectasis in the right mid lung. Bibasilar opacities could reflect atelectasis or infiltrates. Suspect small effusions. No acute bony abnormality. IMPRESSION: Cardiomegaly, vascular congestion. Small bilateral effusions. Right mid lung atelectasis.  Bibasilar atelectasis or infiltrates. Electronically Signed   By: Rolm Baptise M.D.   On: 10/31/2019 18:39    Procedures .Critical Care Performed by: Noemi Chapel, MD Authorized by: Noemi Chapel, MD   Critical care provider statement:    Critical care time (minutes):  35   Critical care time was exclusive of:  Separately billable procedures and treating other patients and teaching time   Critical care was necessary to treat or prevent imminent or life-threatening deterioration of the following conditions:  Respiratory failure and cardiac failure   Critical care was time spent personally by me on the following activities:  Blood draw for specimens, development of treatment plan with patient or surrogate, discussions with consultants, evaluation of patient's response to  treatment, examination of patient, obtaining history from patient or surrogate, ordering and performing treatments and interventions, ordering and review of laboratory studies, ordering and review of radiographic studies, pulse  oximetry, re-evaluation of patient's condition and review of old charts   (including critical care time)  Medications Ordered in ED Medications  furosemide (LASIX) injection 60 mg (60 mg Intravenous Given 10/31/19 1848)  potassium chloride SA (KLOR-CON) CR tablet 20 mEq (20 mEq Oral Given 10/31/19 1848)    ED Course  I have reviewed the triage vital signs and the nursing notes.  Pertinent labs & imaging results that were available during my care of the patient were reviewed by me and considered in my medical decision making (see chart for details).    MDM Rules/Calculators/A&P                       This patient's exam is consistent with acute decompensated congestive heart failure.  She has anasarca is extremely weak and has pulmonary edema clinically.  She will need to be admitted to the hospital, diuresed, she is ill-appearing and critically ill.  The patient's x-ray shows bilateral infiltrates at the bases, there is no leukocytosis but there is an elevated BNP and the patient's diffuse peripheral edema suggest that this is more cardiogenic.  I suspect she has acute pulmonary edema associated with anasarca and fluid overload.  Labs otherwise reveal that she has a normal magnesium, no anemia or leukocytosis, mild hypokalemia which has been replaced and a low albumin at 2.9 which is likely contributing as well.  This is likely multifactorial combined between cirrhosis and congestive heart failure.  Not surprisingly the troponin is elevated at 31 which could be consistent with multiple different etiologies in this case unlikely to be acute ischemia.  I discussed the case with the hospitalist who will admit the patient to the hospital.  She is requiring Lasix  intravenously as well as supplemental oxygen for her persistent hypoxia  RONIYA TETRO was evaluated in Emergency Department on 10/31/2019 for the symptoms described in the history of present illness. She was evaluated in the context of the global COVID-19 pandemic, which necessitated consideration that the patient might be at risk for infection with the SARS-CoV-2 virus that causes COVID-19. Institutional protocols and algorithms that pertain to the evaluation of patients at risk for COVID-19 are in a state of rapid change based on information released by regulatory bodies including the CDC and federal and state organizations. These policies and algorithms were followed during the patient's care in the ED.  Final Clinical Impression(s) / ED Diagnoses Final diagnoses:  Acute pulmonary edema (HCC)  Acute on chronic diastolic congestive heart failure (HCC)  Anasarca    Rx / DC Orders ED Discharge Orders    None       Eber Hong, MD 10/31/19 2017

## 2019-10-31 NOTE — Telephone Encounter (Signed)
I called patient's daughter back, she reports overall her mom is doing some better with less abdominal fluid.  She is planning to do repeat labs today after starting her diuretics last week. Did recommend she monitor weights daily.   She has noticed some increased work of breathing.  Recommended she contact her PCP Dr. Phillips Odor today for further recs (hx of CHF, etc).   If her electrolytes are stable can increase her diuretics to see if will help.   Further recs pending lab results.

## 2019-10-31 NOTE — Patient Instructions (Signed)
Recommend further evaluation in ER - Dr Karilyn Cota to notify ER of patient's arrival.

## 2019-11-01 DIAGNOSIS — I509 Heart failure, unspecified: Secondary | ICD-10-CM

## 2019-11-01 LAB — BASIC METABOLIC PANEL
Anion gap: 10 (ref 5–15)
BUN: 13 mg/dL (ref 8–23)
CO2: 23 mmol/L (ref 22–32)
Calcium: 8.4 mg/dL — ABNORMAL LOW (ref 8.9–10.3)
Chloride: 105 mmol/L (ref 98–111)
Creatinine, Ser: 0.67 mg/dL (ref 0.44–1.00)
GFR calc Af Amer: >60 mL/min (ref >60)
GFR calc non Af Amer: >60 mL/min (ref >60)
Glucose, Bld: 85 mg/dL (ref 70–99)
Potassium: 3.4 mmol/L — ABNORMAL LOW (ref 3.5–5.1)
Sodium: 138 mmol/L (ref 135–145)

## 2019-11-01 LAB — SARS CORONAVIRUS 2 (TAT 6-24 HRS): SARS Coronavirus 2: NEGATIVE

## 2019-11-01 MED ORDER — POTASSIUM CHLORIDE CRYS ER 20 MEQ PO TBCR
40.0000 meq | EXTENDED_RELEASE_TABLET | Freq: Two times a day (BID) | ORAL | Status: DC
  Administered 2019-11-01 – 2019-11-04 (×8): 40 meq via ORAL
  Filled 2019-11-01: qty 2
  Filled 2019-11-01: qty 4
  Filled 2019-11-01 (×6): qty 2

## 2019-11-01 MED ORDER — THYROID 30 MG PO TABS
180.0000 mg | ORAL_TABLET | Freq: Every day | ORAL | Status: DC
  Administered 2019-11-01 – 2019-11-05 (×5): 180 mg via ORAL
  Filled 2019-11-01 (×6): qty 6

## 2019-11-01 MED ORDER — THYROID 30 MG PO TABS
120.0000 mg | ORAL_TABLET | Freq: Every day | ORAL | Status: DC
  Filled 2019-11-01 (×4): qty 4

## 2019-11-01 NOTE — Progress Notes (Signed)
PROGRESS NOTE    [name]  RXV:[id number] DOB: 1933/05/23 DOA: 10/31/2019 PCP: Assunta Found, MD   Brief Narrative:  Per HPI: [name] is a 86 y.o. female with medical history significant for liver cirrhosis, portal hypertension, GI bleed, CHF, hypertension, Sjogren's. Patient was sent to the ED from gastroenterologist office, reports of difficulty breathing of 2 weeks duration, generalized swelling involving her abdomen and legs.  She denies chest pain.  Difficulty breathing is only with exertion, not present at rest.  No cough.  No fevers or chills.  Patient reports compliance with her medications which includes Lasix 20 mg and spironolactone.  She also reports compliance with a low-salt diet.  She denies black stools or blood in her stools.  4 recent hospitalizations for symptomatic anemia due to diverticular GI bleed requiring transfusions Nov 2020 - Dec/ 2020.  1/15: Patient was admitted with decompensated diastolic heart failure and appears to have had a 30 pound weight gain.  Procalcitonin has remained negative and troponin trend has remained low and stable.  She has diuresed 1.3 L of fluid and appears to be doing well with Lasix 40 mg twice daily.  Will work on weaning oxygen as tolerated to room air.  Assessment & Plan:   Active Problems:   Decompensated heart failure (HCC)   Acute hypoxemic respiratory failure secondary to decompensated diastolic heart failure -Continue aggressive diuresis with Lasix 40 mg twice daily IV -Monitor strict I's and O's and daily weights -Recent 2D echocardiogram on 08/2019 with LVEF 50-55% and grade 2 diastolic dysfunction noted, no need to repeat -Maintain on fluid restriction -Daily electrolytes -Continue home spironolactone 50 mg daily  Liver cirrhosis -Likely cryptogenic with background history of Sjogren's -Continue ongoing diuresis -Monitor albumin levels and administer as needed  History of GI bleed -Currently with  stable hemoglobin and hematocrit levels -Monitor repeat CBC in a.m. -No overt bleeding identified  Hypothyroidism -Continue home Armour Thyroid  Hypertension-controlled -Monitor closely with ongoing diuresis   DVT prophylaxis: Lovenox Code Status: Full Family Communication:  Disposition Plan: Continue diuresis and monitor ongoing weights and fluid balance.   Consultants:   None  Procedures:   None  Antimicrobials:   None   Subjective: Patient seen and evaluated today with no new acute complaints or concerns. No acute concerns or events noted overnight.  She appears to be doing well and wants to have her thyroid medication prior to breakfast.  Objective: Vitals:   11/01/19 0130 11/01/19 0300 11/01/19 0500 11/01/19 0653  BP: 137/73 128/72 113/61 112/86  Pulse: 85 81 75 91  Resp: 18 15 12 16   Temp:    97.7 F (36.5 C)  TempSrc:    Oral  SpO2: 97% 100% 100% 97%  Weight:    57.9 kg  Height:    5\' 1"  (1.549 m)    Intake/Output Summary (Last 24 hours) at 11/01/2019 1152 Last data filed at 10/31/2019 2339 Gross per 24 hour  Intake --  Output 1300 ml  Net -1300 ml   Filed Weights   10/31/19 1645 11/01/19 0653  Weight: 59.9 kg 57.9 kg    Examination:  General exam: Appears calm and comfortable  Respiratory system: Clear to auscultation. Respiratory effort normal.  Currently on 2 L nasal cannula oxygen. Cardiovascular system: S1 & S2 heard, RRR. No JVD, murmurs, rubs, gallops or clicks.  1-2+ pedal edema noted. Gastrointestinal system: Abdomen is nondistended, soft and nontender. No organomegaly or masses felt. Normal bowel sounds heard. Central nervous system:  Alert and oriented. No focal neurological deficits. Extremities: Symmetric 5 x 5 power. Skin: No rashes, lesions or ulcers Psychiatry: Judgement and insight appear normal. Mood & affect appropriate.     Data Reviewed: I have personally reviewed following labs and imaging studies  CBC: Recent Labs   Lab 10/31/19 1808  WBC 7.6  HGB 14.7  HCT 48.3*  MCV 92.0  PLT 627   Basic Metabolic Panel: Recent Labs  Lab 10/31/19 1808 11/01/19 0404  NA 137 138  K 3.2* 3.4*  CL 103 105  CO2 21* 23  GLUCOSE 84 85  BUN 13 13  CREATININE 0.74 0.67  CALCIUM 8.8* 8.4*  MG 1.9  --    GFR: Estimated Creatinine Clearance: 42.8 mL/min (by C-G formula based on SCr of 0.67 mg/dL). Liver Function Tests: Recent Labs  Lab 10/31/19 1808  AST 53*  ALT 39  ALKPHOS 128*  BILITOT 1.2  PROT 6.9  ALBUMIN 2.9*   No results for input(s): LIPASE, AMYLASE in the last 168 hours. No results for input(s): AMMONIA in the last 168 hours. Coagulation Profile: No results for input(s): INR, PROTIME in the last 168 hours. Cardiac Enzymes: No results for input(s): CKTOTAL, CKMB, CKMBINDEX, TROPONINI in the last 168 hours. BNP (last 3 results) No results for input(s): PROBNP in the last 8760 hours. HbA1C: No results for input(s): HGBA1C in the last 72 hours. CBG: No results for input(s): GLUCAP in the last 168 hours. Lipid Profile: No results for input(s): CHOL, HDL, LDLCALC, TRIG, CHOLHDL, LDLDIRECT in the last 72 hours. Thyroid Function Tests: No results for input(s): TSH, T4TOTAL, FREET4, T3FREE, THYROIDAB in the last 72 hours. Anemia Panel: No results for input(s): VITAMINB12, FOLATE, FERRITIN, TIBC, IRON, RETICCTPCT in the last 72 hours. Sepsis Labs: Recent Labs  Lab 10/31/19 2143  PROCALCITON <0.10    No results found for this or any previous visit (from the past 240 hour(s)).       Radiology Studies: DG Chest 2 View  Result Date: 10/31/2019 CLINICAL DATA:  Shortness of breath EXAM: CHEST - 2 VIEW COMPARISON:  09/17/2019 FINDINGS: Cardiomegaly with vascular congestion. Atelectasis in the right mid lung. Bibasilar opacities could reflect atelectasis or infiltrates. Suspect small effusions. No acute bony abnormality. IMPRESSION: Cardiomegaly, vascular congestion. Small bilateral  effusions. Right mid lung atelectasis.  Bibasilar atelectasis or infiltrates. Electronically Signed   By: Rolm Baptise M.D.   On: 10/31/2019 18:39        Scheduled Meds: . enoxaparin (LOVENOX) injection  40 mg Subcutaneous Q24H  . furosemide  40 mg Intravenous Q12H  . pantoprazole  40 mg Oral Daily  . potassium chloride  40 mEq Oral BID  . sertraline  25 mg Oral q morning - 10a  . spironolactone  50 mg Oral Daily  . thyroid  180 mg Oral QAC breakfast   Continuous Infusions:   LOS: 1 day    Time spent: 30 minutes    Montrail Mehrer Darleen Crocker, DO [facility] Pager [phone]  If 7PM-7AM, please contact night-coverage [email or web address] Password [facility] 11/01/2019, 11:52 AM

## 2019-11-01 NOTE — Care Management Important Message (Signed)
Important Message  Patient Details  Name: [name] MRN: [id number] Date of Birth: May 30, 1933   Medicare Important Message Given:  Yes     Corey Harold 11/01/2019, 12:27 PM

## 2019-11-01 NOTE — Plan of Care (Signed)
  Problem: Education: Goal: Knowledge of General Education information will improve Description: Including pain rating scale, medication(s)/side effects and non-pharmacologic comfort measures Outcome: Adequate for Discharge   Problem: Health Behavior/Discharge Planning: Goal: Ability to manage health-related needs will improve Outcome: Adequate for Discharge   Problem: Clinical Measurements: Goal: Ability to maintain clinical measurements within normal limits will improve Outcome: Adequate for Discharge Goal: Will remain free from infection Outcome: Progressing Goal: Diagnostic test results will improve Outcome: Progressing

## 2019-11-02 ENCOUNTER — Encounter (HOSPITAL_COMMUNITY): Payer: Self-pay | Admitting: Internal Medicine

## 2019-11-02 LAB — COMPREHENSIVE METABOLIC PANEL
ALT: 30 U/L (ref 0–44)
AST: 38 U/L (ref 15–41)
Albumin: 2.4 g/dL — ABNORMAL LOW (ref 3.5–5.0)
Alkaline Phosphatase: 95 U/L (ref 38–126)
Anion gap: 12 (ref 5–15)
BUN: 13 mg/dL (ref 8–23)
CO2: 27 mmol/L (ref 22–32)
Calcium: 8.8 mg/dL — ABNORMAL LOW (ref 8.9–10.3)
Chloride: 98 mmol/L (ref 98–111)
Creatinine, Ser: 0.79 mg/dL (ref 0.44–1.00)
GFR calc Af Amer: >60 mL/min (ref >60)
GFR calc non Af Amer: >60 mL/min (ref >60)
Glucose, Bld: 81 mg/dL (ref 70–99)
Potassium: 4.2 mmol/L (ref 3.5–5.1)
Sodium: 137 mmol/L (ref 135–145)
Total Bilirubin: 1.1 mg/dL (ref 0.3–1.2)
Total Protein: 5.7 g/dL — ABNORMAL LOW (ref 6.5–8.1)

## 2019-11-02 LAB — MAGNESIUM: Magnesium: 1.5 mg/dL — ABNORMAL LOW (ref 1.7–2.4)

## 2019-11-02 MED ORDER — NYSTATIN 100000 UNIT/GM EX POWD
Freq: Three times a day (TID) | CUTANEOUS | Status: DC
  Filled 2019-11-02: qty 15

## 2019-11-02 MED ORDER — MAGNESIUM SULFATE 2 GM/50ML IV SOLN
2.0000 g | Freq: Once | INTRAVENOUS | Status: AC
  Administered 2019-11-02: 13:00:00 2 g via INTRAVENOUS
  Filled 2019-11-02: qty 50

## 2019-11-02 NOTE — Plan of Care (Signed)
  Problem: Clinical Measurements: Goal: Will remain free from infection Outcome: Progressing Goal: Diagnostic test results will improve Outcome: Progressing Goal: Respiratory complications will improve Outcome: Progressing Goal: Cardiovascular complication will be avoided Outcome: Progressing   Problem: Activity: Goal: Risk for activity intolerance will decrease Outcome: Progressing   Problem: Nutrition: Goal: Adequate nutrition will be maintained Outcome: Progressing   Problem: Coping: Goal: Level of anxiety will decrease Outcome: Progressing   Problem: Elimination: Goal: Will not experience complications related to bowel motility Outcome: Progressing Goal: Will not experience complications related to urinary retention Outcome: Progressing   Problem: Pain Managment: Goal: General experience of comfort will improve Outcome: Progressing   Problem: Safety: Goal: Ability to remain free from injury will improve Outcome: Progressing   Problem: Skin Integrity: Goal: Risk for impaired skin integrity will decrease Outcome: Progressing   

## 2019-11-02 NOTE — Progress Notes (Signed)
PROGRESS NOTE    [name]  XBD:[id number] DOB: 1933/03/17 DOA: 10/31/2019 PCP: Assunta Found, MD   Brief Narrative:  Per HPI: [name] a 86 y.o.femalewith medical history significant forliver cirrhosis, portal hypertension, GI bleed, CHF, hypertension, Sjogren's. Patient was sent to the ED from gastroenterologist office, reports of difficulty breathing of 2 weeks duration, generalized swelling involving her abdomenandlegs.She denies chest pain. Difficulty breathing is only with exertion,not present at rest. No cough. No fevers or chills. Patient reports compliance with her medications which includes Lasix 20 mg and spironolactone. She also reports compliance with a low-salt diet. She denies black stools or blood in her stools.  4recent hospitalizationsfor symptomatic anemia due to diverticular GI bleed requiring transfusions Nov 2020 - Dec/ 2020.  1/15: Patient was admitted with decompensated diastolic heart failure and appears to have had a 30 pound weight gain.  Procalcitonin has remained negative and troponin trend has remained low and stable.  She has diuresed 1.3 L of fluid and appears to be doing well with Lasix 40 mg twice daily.  Will work on weaning oxygen as tolerated to room air.  1/16: Patient continues to diurese quite well and is currently on 1 L nasal cannula oxygen.  3.2 L negative fluid balance noted overnight.  Continue ongoing Lasix.  Supplement magnesium.  Assessment & Plan:   Active Problems:   Decompensated heart failure (HCC)   Acute hypoxemic respiratory failure secondary to decompensated diastolic heart failure -Continue aggressive diuresis with Lasix 40 mg twice daily IV -Monitor strict I's and O's and daily weights -Recent 2D echocardiogram on 08/2019 with LVEF 50-55% and grade 2 diastolic dysfunction noted, no need to repeat -Maintain on fluid restriction -Daily electrolytes -Continue home spironolactone 50 mg  daily  Hypomagnesemia -Supplement today and repeat labs in a.m.  Liver cirrhosis -Likely cryptogenic with background history of Sjogren's -Continue ongoing diuresis -Monitor albumin levels and administer as needed  History of GI bleed -Currently with stable hemoglobin and hematocrit levels -Monitor repeat CBC in a.m. -No overt bleeding identified  Hypothyroidism -Continue home Armour Thyroid  Hypertension-controlled -Monitor closely with ongoing diuresis   DVT prophylaxis: Lovenox Code Status: Full Family Communication:  Called daughter 1/15 Disposition Plan: Continue diuresis and monitor ongoing weights and fluid balance.  Supplement magnesium today and continue weaning oxygen.   Consultants:   None  Procedures:   None  Antimicrobials:   None  Subjective: Patient seen and evaluated today with no new acute complaints or concerns. No acute concerns or events noted overnight.  Objective: Vitals:   11/01/19 2021 11/01/19 2106 11/02/19 0541 11/02/19 1100  BP:  118/67 122/76 120/65  Pulse: 83 89 88 96  Resp: 16 18 17 18   Temp:  98 F (36.7 C) 97.7 F (36.5 C)   TempSrc:  Oral Oral   SpO2: 93% 97% 99% 96%  Weight:   53.2 kg   Height:        Intake/Output Summary (Last 24 hours) at 11/02/2019 1156 Last data filed at 11/02/2019 0815 Gross per 24 hour  Intake 600 ml  Output 3800 ml  Net -3200 ml   Filed Weights   10/31/19 1645 11/01/19 0653 11/02/19 0541  Weight: 59.9 kg 57.9 kg 53.2 kg    Examination:  General exam: Appears calm and comfortable  Respiratory system: Clear to auscultation. Respiratory effort normal.  Currently on 1 L nasal cannula oxygen. Cardiovascular system: S1 & S2 heard, RRR. No JVD, murmurs, rubs, gallops or clicks.  Pedal edema  resolved. Gastrointestinal system: Abdomen is nondistended, soft and nontender. No organomegaly or masses felt. Normal bowel sounds heard. Central nervous system: Alert and oriented. No focal  neurological deficits. Extremities: Symmetric 5 x 5 power. Skin: No rashes, lesions or ulcers Psychiatry: Judgement and insight appear normal. Mood & affect appropriate.     Data Reviewed: I have personally reviewed following labs and imaging studies  CBC: Recent Labs  Lab 10/31/19 1808  WBC 7.6  HGB 14.7  HCT 48.3*  MCV 92.0  PLT 225   Basic Metabolic Panel: Recent Labs  Lab 10/31/19 1808 11/01/19 0404 11/02/19 0847  NA 137 138 137  K 3.2* 3.4* 4.2  CL 103 105 98  CO2 21* 23 27  GLUCOSE 84 85 81  BUN 13 13 13   CREATININE 0.74 0.67 0.79  CALCIUM 8.8* 8.4* 8.8*  MG 1.9  --  1.5*   GFR: Estimated Creatinine Clearance: 39.5 mL/min (by C-G formula based on SCr of 0.79 mg/dL). Liver Function Tests: Recent Labs  Lab 10/31/19 1808 11/02/19 0847  AST 53* 38  ALT 39 30  ALKPHOS 128* 95  BILITOT 1.2 1.1  PROT 6.9 5.7*  ALBUMIN 2.9* 2.4*   No results for input(s): LIPASE, AMYLASE in the last 168 hours. No results for input(s): AMMONIA in the last 168 hours. Coagulation Profile: No results for input(s): INR, PROTIME in the last 168 hours. Cardiac Enzymes: No results for input(s): CKTOTAL, CKMB, CKMBINDEX, TROPONINI in the last 168 hours. BNP (last 3 results) No results for input(s): PROBNP in the last 8760 hours. HbA1C: No results for input(s): HGBA1C in the last 72 hours. CBG: No results for input(s): GLUCAP in the last 168 hours. Lipid Profile: No results for input(s): CHOL, HDL, LDLCALC, TRIG, CHOLHDL, LDLDIRECT in the last 72 hours. Thyroid Function Tests: No results for input(s): TSH, T4TOTAL, FREET4, T3FREE, THYROIDAB in the last 72 hours. Anemia Panel: No results for input(s): VITAMINB12, FOLATE, FERRITIN, TIBC, IRON, RETICCTPCT in the last 72 hours. Sepsis Labs: Recent Labs  Lab 10/31/19 2143  PROCALCITON <0.10    Recent Results (from the past 240 hour(s))  SARS CORONAVIRUS 2 (TAT 6-24 HRS) Nasopharyngeal Nasopharyngeal Swab     Status: None    Collection Time: 10/31/19  7:48 PM   Specimen: Nasopharyngeal Swab  Result Value Ref Range Status   SARS Coronavirus 2 NEGATIVE NEGATIVE Final    Comment: (NOTE) SARS-CoV-2 target nucleic acids are NOT DETECTED. The SARS-CoV-2 RNA is generally detectable in upper and lower respiratory specimens during the acute phase of infection. Negative results do not preclude SARS-CoV-2 infection, do not rule out co-infections with other pathogens, and should not be used as the sole basis for treatment or other patient management decisions. Negative results must be combined with clinical observations, patient history, and epidemiological information. The expected result is Negative. Fact Sheet for Patients: 11/02/19 Fact Sheet for Healthcare Providers: [employer]Slick[employer]no This test is not yet approved or cleared by the quierodirigir.com FDA and  has been authorized for detection and/or diagnosis of SARS-CoV-2 by FDA under an Emergency Use Authorization (EUA). This EUA will remain  in effect (meaning this test can be used) for the duration of the COVID-19 declaration under Section 56 4(b)(1) of the Act, 21 U.S.C. section 360bbb-3(b)(1), unless the authorization is terminated or revoked sooner. Performed at [facility], [address], [place], [place]          Radiology Studies: DG Chest 2 View  Result Date: 10/31/2019 CLINICAL  DATA:  Shortness of breath EXAM: CHEST - 2 VIEW COMPARISON:  09/17/2019 FINDINGS: Cardiomegaly with vascular congestion. Atelectasis in the right mid lung. Bibasilar opacities could reflect atelectasis or infiltrates. Suspect small effusions. No acute bony abnormality. IMPRESSION: Cardiomegaly, vascular congestion. Small bilateral effusions. Right mid lung atelectasis.  Bibasilar atelectasis or infiltrates. Electronically Signed   By: Rolm Baptise M.D.   On: 10/31/2019 18:39        Scheduled  Meds: . enoxaparin (LOVENOX) injection  40 mg Subcutaneous Q24H  . furosemide  40 mg Intravenous Q12H  . pantoprazole  40 mg Oral Daily  . potassium chloride  40 mEq Oral BID  . sertraline  25 mg Oral q Peterson - 10a  . spironolactone  50 mg Oral Daily  . thyroid  180 mg Oral QAC breakfast   Continuous Infusions: . magnesium sulfate bolus IVPB       LOS: 2 days    Time spent: 30 minutes    Adarrius Graeff Darleen Crocker, DO [facility] Pager [phone]  If 7PM-7AM, please contact night-coverage [email or web address] Password [facility] 11/02/2019, 11:56 AM

## 2019-11-03 ENCOUNTER — Inpatient Hospital Stay (HOSPITAL_COMMUNITY): Payer: Medicare Other

## 2019-11-03 LAB — BASIC METABOLIC PANEL
Anion gap: 8 (ref 5–15)
BUN: 13 mg/dL (ref 8–23)
CO2: 31 mmol/L (ref 22–32)
Calcium: 8.4 mg/dL — ABNORMAL LOW (ref 8.9–10.3)
Chloride: 96 mmol/L — ABNORMAL LOW (ref 98–111)
Creatinine, Ser: 0.7 mg/dL (ref 0.44–1.00)
GFR calc Af Amer: >60 mL/min (ref >60)
GFR calc non Af Amer: >60 mL/min (ref >60)
Glucose, Bld: 96 mg/dL (ref 70–99)
Potassium: 4.5 mmol/L (ref 3.5–5.1)
Sodium: 135 mmol/L (ref 135–145)

## 2019-11-03 LAB — MAGNESIUM: Magnesium: 1.8 mg/dL (ref 1.7–2.4)

## 2019-11-03 NOTE — Progress Notes (Signed)
PROGRESS NOTE    [name]  XIH:[id number] DOB: 03-Jun-1933 DOA: 10/31/2019 PCP: Assunta Found, MD   Brief Narrative:  Per HPI: [name] a 86 y.o.femalewith medical history significant forliver cirrhosis, portal hypertension, GI bleed, CHF, hypertension, Sjogren's. Patient was sent to the ED from gastroenterologist office, reports of difficulty breathing of 2 weeks duration, generalized swelling involving her abdomenandlegs.She denies chest pain. Difficulty breathing is only with exertion,not present at rest. No cough. No fevers or chills. Patient reports compliance with her medications which includes Lasix 20 mg and spironolactone. She also reports compliance with a low-salt diet. She denies black stools or blood in her stools.  4recent hospitalizationsfor symptomatic anemia due to diverticular GI bleed requiring transfusions Nov 2020 - Dec/ 2020.  1/15:Patient was admitted with decompensated diastolic heart failure and appears to have had a 30 pound weight gain. Procalcitonin has remained negative and troponin trend has remained low and stable. She has diuresed 1.3 L of fluid and appears to be doing well with Lasix 40 mg twice daily. Will work on weaning oxygen as tolerated to room air.  1/16: Patient continues to diurese quite well and is currently on 1 L nasal cannula oxygen.  3.2 L negative fluid balance noted overnight.  Continue ongoing Lasix.  Supplement magnesium.  1/17: Patient continues to diurese quite well today with stable creatinine levels.  Continue to wean oxygen and have PT evaluation and maintain on fall precautions as she did have a fall last night, but with no significant injuries noted.  CT of the head with no acute findings.  She is alert and oriented this morning.  Continue ongoing Lasix for diuresis.  Assessment & Plan:   Active Problems:   Decompensated heart failure (HCC)   Acute hypoxemic respiratory failure secondary to  decompensated diastolic heart failure -Continue aggressive diuresis with Lasix 40 mg twice daily IV -Monitor strict I's and O's and daily weights -Recent 2D echocardiogram on 08/2019 with LVEF 50-55% and grade 2 diastolic dysfunction noted, no need to repeat -Maintain on fluid restriction -Daily electrolytes -Continue home spironolactone 50 mg daily  Fall -Maintain on fall precautions -PT evaluation  Hypomagnesemia-resolved -Repeat in a.m.  Liver cirrhosis -Likely cryptogenic with background history of Sjogren's -Continue ongoing diuresis -Monitor albumin levels and administer as needed  History of GI bleed -Currently with stable hemoglobin and hematocrit levels -Monitor repeat CBC in a.m. -No overt bleeding identified  Hypothyroidism -Continue home Armour Thyroid  Hypertension-controlled -Monitor closely with ongoing diuresis   DVT prophylaxis:Lovenox Code Status:Full Family Communication: Called daughter 1/17 Disposition Plan:Continue diuresis and monitor ongoing weights and fluid balance.  Continue to monitor closely on fall precautions with PT evaluation ordered.  Wean oxygen as tolerated.   Consultants:  None  Procedures:  None  Antimicrobials:   None  Subjective: Patient seen and evaluated today and appears to be alert and oriented to person place and time.  She denies any pain anywhere.  She did have a fall last night.  Objective: Vitals:   11/02/19 1605 11/02/19 2203 11/03/19 0500 11/03/19 0514  BP:  118/60  117/67  Pulse:  85  80  Resp:  19  18  Temp:  97.6 F (36.4 C)  98.3 F (36.8 C)  TempSrc:  Oral    SpO2: 98% 94%  100%  Weight:   53.1 kg   Height:        Intake/Output Summary (Last 24 hours) at 11/03/2019 1100 Last data filed at 11/03/2019 0942 Gross per  24 hour  Intake 641.95 ml  Output 3000 ml  Net -2358.05 ml   Filed Weights   11/01/19 0653 11/02/19 0541 11/03/19 0500  Weight: 57.9 kg 53.2 kg 53.1 kg     Examination:  General exam: Appears calm and comfortable  Respiratory system: Clear to auscultation. Respiratory effort normal.  Currently on 2 L nasal cannula oxygen. Cardiovascular system: S1 & S2 heard, RRR. No JVD, murmurs, rubs, gallops or clicks. No pedal edema. Gastrointestinal system: Abdomen is nondistended, soft and nontender. No organomegaly or masses felt. Normal bowel sounds heard. Central nervous system: Alert and oriented.  Extremities: No significant edema Skin: No rashes, lesions or ulcers Psychiatry: Flat affect.    Data Reviewed: I have personally reviewed following labs and imaging studies  CBC: Recent Labs  Lab 10/31/19 1808  WBC 7.6  HGB 14.7  HCT 48.3*  MCV 92.0  PLT 225   Basic Metabolic Panel: Recent Labs  Lab 10/31/19 1808 11/01/19 0404 11/02/19 0847 11/03/19 0704  NA 137 138 137 135  K 3.2* 3.4* 4.2 4.5  CL 103 105 98 96*  CO2 21* 23 27 31   GLUCOSE 84 85 81 96  BUN 13 13 13 13   CREATININE 0.74 0.67 0.79 0.70  CALCIUM 8.8* 8.4* 8.8* 8.4*  MG 1.9  --  1.5* 1.8   GFR: Estimated Creatinine Clearance: 39.5 mL/min (by C-G formula based on SCr of 0.7 mg/dL). Liver Function Tests: Recent Labs  Lab 10/31/19 1808 11/02/19 0847  AST 53* 38  ALT 39 30  ALKPHOS 128* 95  BILITOT 1.2 1.1  PROT 6.9 5.7*  ALBUMIN 2.9* 2.4*   No results for input(s): LIPASE, AMYLASE in the last 168 hours. No results for input(s): AMMONIA in the last 168 hours. Coagulation Profile: No results for input(s): INR, PROTIME in the last 168 hours. Cardiac Enzymes: No results for input(s): CKTOTAL, CKMB, CKMBINDEX, TROPONINI in the last 168 hours. BNP (last 3 results) No results for input(s): PROBNP in the last 8760 hours. HbA1C: No results for input(s): HGBA1C in the last 72 hours. CBG: No results for input(s): GLUCAP in the last 168 hours. Lipid Profile: No results for input(s): CHOL, HDL, LDLCALC, TRIG, CHOLHDL, LDLDIRECT in the last 72 hours. Thyroid  Function Tests: No results for input(s): TSH, T4TOTAL, FREET4, T3FREE, THYROIDAB in the last 72 hours. Anemia Panel: No results for input(s): VITAMINB12, FOLATE, FERRITIN, TIBC, IRON, RETICCTPCT in the last 72 hours. Sepsis Labs: Recent Labs  Lab 10/31/19 2143  PROCALCITON <0.10    Recent Results (from the past 240 hour(s))  SARS CORONAVIRUS 2 (TAT 6-24 HRS) Nasopharyngeal Nasopharyngeal Swab     Status: None   Collection Time: 10/31/19  7:48 PM   Specimen: Nasopharyngeal Swab  Result Value Ref Range Status   SARS Coronavirus 2 NEGATIVE NEGATIVE Final    Comment: (NOTE) SARS-CoV-2 target nucleic acids are NOT DETECTED. The SARS-CoV-2 RNA is generally detectable in upper and lower respiratory specimens during the acute phase of infection. Negative results do not preclude SARS-CoV-2 infection, do not rule out co-infections with other pathogens, and should not be used as the sole basis for treatment or other patient management decisions. Negative results must be combined with clinical observations, patient history, and epidemiological information. The expected result is Negative. Fact Sheet for Patients: 2144 Fact Sheet for Healthcare Providers: 11/02/19 This test is not yet approved or cleared by the [employer] FDA and  has been authorized for detection and/or diagnosis of SARS-CoV-2 by FDA under an  Emergency Use Authorization (EUA). This EUA will remain  in effect (meaning this test can be used) for the duration of the COVID-19 declaration under Section 56 4(b)(1) of the Act, 21 U.S.C. section 360bbb-3(b)(1), unless the authorization is terminated or revoked sooner. Performed at [facility], [place] [address], [place], [place] [address]          Radiology Studies: CT HEAD WO CONTRAST  Result Date: 11/03/2019 CLINICAL DATA:  Altered mental status. Unwitnessed fall. EXAM: CT HEAD WITHOUT  CONTRAST TECHNIQUE: Contiguous axial images were obtained from the base of the skull through the vertex without intravenous contrast. COMPARISON:  Head CT 08/27/2019 FINDINGS: Brain: No intracranial hemorrhage, mass effect, or midline shift. Mild atrophy, normal for age. No hydrocephalus. The basilar cisterns are patent. Mild chronic small vessel ischemia. No evidence of territorial infarct or acute ischemia. No extra-axial or intracranial fluid collection. Vascular: No hyperdense vessel. Skull: No fracture or focal lesion. Sinuses/Orbits: Paranasal sinuses and mastoid air cells are clear. The visualized orbits are unremarkable. Other: None. IMPRESSION: No acute intracranial abnormality.  No skull fracture. Electronically Signed   By: Keith Rake M.D.   On: 11/03/2019 02:45        Scheduled Meds: . enoxaparin (LOVENOX) injection  40 mg Subcutaneous Q24H  . furosemide  40 mg Intravenous Q12H  . nystatin   Topical TID  . pantoprazole  40 mg Oral Daily  . potassium chloride  40 mEq Oral BID  . sertraline  25 mg Oral q morning - 10a  . spironolactone  50 mg Oral Daily  . thyroid  180 mg Oral QAC breakfast   Continuous Infusions:   LOS: 3 days    Time spent: 30 minutes    Briley Sulton Darleen Crocker, DO [facility] Pager [phone]  If 7PM-7AM, please contact night-coverage [email or web address] Password [email or web address] 11/03/2019, 11:00 AM

## 2019-11-03 NOTE — Progress Notes (Signed)
TR[facility] night shift.  The patient had an unwitnessed fall and was found on the floor by the staff.  There were no apparent injuries, the patient has altered mentation.  CT scan of the brain was done and there were no acute injuries.  Please see images and full detailed report for further detail.  Sanda Klein, MD

## 2019-11-03 NOTE — Progress Notes (Signed)
Pt had an unwitnessed fall. Pt's vitals are stable. MD notified and CT was ordered STAT. Pt is not stating any visual or physical injuries at this time. Bed alarm is on and I will continue to monitor pt.

## 2019-11-03 NOTE — Progress Notes (Signed)
Pt is A&Ox4. Pt is refusing to call family about previous fall at this time.

## 2019-11-04 ENCOUNTER — Other Ambulatory Visit: Payer: Self-pay | Admitting: *Deleted

## 2019-11-04 LAB — BASIC METABOLIC PANEL
Anion gap: 11 (ref 5–15)
BUN: 16 mg/dL (ref 8–23)
CO2: 33 mmol/L — ABNORMAL HIGH (ref 22–32)
Calcium: 8.7 mg/dL — ABNORMAL LOW (ref 8.9–10.3)
Chloride: 90 mmol/L — ABNORMAL LOW (ref 98–111)
Creatinine, Ser: 0.84 mg/dL (ref 0.44–1.00)
GFR calc Af Amer: >60 mL/min (ref >60)
GFR calc non Af Amer: >60 mL/min (ref >60)
Glucose, Bld: 97 mg/dL (ref 70–99)
Potassium: 4.7 mmol/L (ref 3.5–5.1)
Sodium: 134 mmol/L — ABNORMAL LOW (ref 135–145)

## 2019-11-04 LAB — MAGNESIUM: Magnesium: 1.7 mg/dL (ref 1.7–2.4)

## 2019-11-04 NOTE — Plan of Care (Signed)
  Problem: Acute Rehab PT Goals(only PT should resolve) Goal: Pt Will Go Supine/Side To Sit Outcome: Progressing Flowsheets (Taken 11/04/2019 1507) Pt will go Supine/Side to Sit: Independently Goal: Patient Will Transfer Sit To/From Stand Outcome: Progressing Flowsheets (Taken 11/04/2019 1507) Patient will transfer sit to/from stand: with supervision Goal: Pt Will Transfer Bed To Chair/Chair To Bed Outcome: Progressing Flowsheets (Taken 11/04/2019 1507) Pt will Transfer Bed to Chair/Chair to Bed: with supervision Goal: Pt Will Ambulate Outcome: Progressing Flowsheets (Taken 11/04/2019 1507) Pt will Ambulate:  75 feet  with supervision  with cane   3:08 PM, 11/04/19 Ocie Bob, MPT Physical Therapist with [facility] [phone] office [phone] mobile phone

## 2019-11-04 NOTE — TOC Initial Note (Signed)
Transition of Care [facility]) - Initial/Assessment Note    Patient Details  Name: [name] MRN: [id number] Date of Birth: July 08, 1933  [facility] CM/SW Contact:    Leitha Bleak, RN Phone Number: 11/04/2019, 3:55 PM  Clinical Narrative:         Patient admitted for decompensated heart failure. Patient also has a high readmission score. Patient lives alone, however someone is staying with her 24/7. [name] - daughter states she or her sister is staying and a brother stays some during the day.  They are able to assist and care for her and provide transportation. PT is recommending HHPT, [name] will call CM back tomorrow after speaking with her siblings. TOC to follow.        Expected Discharge Plan: Home w Home Health Services Barriers to Discharge: Continued Medical Work up   Patient Goals and CMS Choice Patient states their goals for this hospitalization and ongoing recovery are:: to go home.      Expected Discharge Plan and Services Expected Discharge Plan: Home w Home Health Services        Prior Living Arrangements/Services   Lives with:: Self Patient language and need for interpreter reviewed:: Yes Do you feel safe going back to the place where you live?: Yes      Need for Family Participation in Patient Care: Yes (Comment) Care giver support system in place?: Yes (comment)   Criminal Activity/Legal Involvement Pertinent to Current Situation/Hospitalization: No - Comment as needed  Activities of Daily Living Home Assistive Devices/Equipment: None ADL Screening (condition at time of admission) Patient's cognitive ability adequate to safely complete daily activities?: Yes Is the patient deaf or have difficulty hearing?: No Does the patient have difficulty seeing, even when wearing glasses/contacts?: No Does the patient have difficulty concentrating, remembering, or making decisions?: No Patient able to express need for assistance with ADLs?: Yes Does  the patient have difficulty dressing or bathing?: No Independently performs ADLs?: Yes (appropriate for developmental age) Does the patient have difficulty walking or climbing stairs?: Yes Weakness of Legs: Both Weakness of Arms/Hands: Both  Permission Sought/Granted Permission sought to share information with : Case Manager    Share Information with NAME: [name]     Permission granted to share info w Relationship: Daughter     Emotional Assessment     Affect (typically observed): Accepting   Alcohol / Substance Use: Not Applicable Psych Involvement: No (comment)  Admission diagnosis:  Anasarca [R60.1] Acute pulmonary edema (HCC) [J81.0] Acute on chronic diastolic congestive heart failure (HCC) [I50.33] Decompensated heart failure (HCC) [I50.9] Patient Active Problem List   Diagnosis Date Noted  . Decompensated heart failure (HCC) 10/31/2019  . Acute on chronic anemia 09/17/2019  . Elevated lactic acid level 09/17/2019  . AKI (acute kidney injury) (HCC) 09/17/2019  . GI hemorrhage 09/09/2019  . Acute blood loss anemia 09/01/2019  . Lower GI bleed 09/01/2019  . Syncope 09/01/2019  . Hypothyroidism 09/01/2019  . Hypokalemia 09/01/2019  . Chronic diarrhea 09/01/2019  . Symptomatic anemia 08/27/2019  . Portal hypertension --- liver cirrhosis 08/27/2019  . Cirrhosis of liver with ascites and portal hypertension 08/27/2019  . Diarrhea 04/11/2019   PCP:  Assunta Found, MD Pharmacy:   [facility] - [place], [place] - [address] [place] [address] Phone: [phone] Fax: [phone]   Readmission Risk Interventions Readmission Risk Prevention Plan 11/04/2019  Transportation Screening Complete  Medication Review (RN Care Manager) Complete  PCP or Specialist appointment within 3-5 days of discharge Not Complete  HRI or [place] Complete  SW Recovery Care/Counseling Consult Complete  Palliative Care Screening Not Complete   Skilled [place] Not Complete  Some recent data might be hidden

## 2019-11-04 NOTE — Progress Notes (Signed)
PROGRESS NOTE    [name]  KXF:[id number] DOB: [birth year] DOA: 10/31/2019 PCP: Assunta Found, MD   Brief Narrative:  Per HPI: [name] a 86 y.o.femalewith medical history significant forliver cirrhosis, portal hypertension, GI bleed, CHF, hypertension, Sjogren's. Patient was sent to the ED from gastroenterologist office, reports of difficulty breathing of 2 weeks duration, generalized swelling involving her abdomenandlegs.She denies chest pain. Difficulty breathing is only with exertion,not present at rest. No cough. No fevers or chills. Patient reports compliance with her medications which includes Lasix 20 mg and spironolactone. She also reports compliance with a low-salt diet. She denies black stools or blood in her stools.  4recent hospitalizationsfor symptomatic anemia due to diverticular GI bleed requiring transfusions Nov 2020 - Dec/ 2020.  1/15:Patient was admitted with decompensated diastolic heart failure and appears to have had a 30 pound weight gain. Procalcitonin has remained negative and troponin trend has remained low and stable. She has diuresed 1.3 L of fluid and appears to be doing well with Lasix 40 mg twice daily. Will work on weaning oxygen as tolerated to room air.  1/16:Patient continues to diurese quite well and is currently on 1 L nasal cannula oxygen. 3.2 L negative fluid balance noted overnight. Continue ongoing Lasix. Supplement magnesium.  1/17: Patient continues to diurese quite well today with stable creatinine levels.  Continue to wean oxygen and have PT evaluation and maintain on fall precautions as she did have a fall last night, but with no significant injuries noted.  CT of the head with no acute findings.  She is alert and oriented this morning.  Continue ongoing Lasix for diuresis.  1/18: Patient continues to diurese well with approximately 2 L net negative fluid balance over the last 24 hours.  She continues to  have stable renal function and is still on 1 L nasal cannula.  Requires ongoing diuresis as her weight is currently 117 pounds and her baseline weight is near 105 pounds.  Assessment & Plan:   Active Problems:   Decompensated heart failure (HCC)   Acute hypoxemic respiratory failure secondary to decompensated diastolic heart failure-improving -Continue aggressive diuresis with Lasix 40 mg twice daily IV -Monitor strict I's and O's and daily weights -Recent 2D echocardiogram on 08/2019 with LVEF 50-55% and grade 2 diastolic dysfunction noted, no need to repeat -Maintain on fluid restriction -Daily electrolytes -Continue home spironolactone 50 mg daily -Currently at 117 pounds and needs to reach near baseline of 105 pounds.  Fall -Maintain on fall precautions -PT evaluation  Hypomagnesemia-resolved -Repeat in a.m.  Liver cirrhosis -Likely cryptogenic with background history of Sjogren's -Continue ongoing diuresis -Monitor albumin levels and administer as needed  History of GI bleed -Currently with stable hemoglobin and hematocrit levels -Monitor repeat CBC in a.m. -No overt bleeding identified  Hypothyroidism -Continue home Arm[employer] Thyroid  Hypertension-controlled -Monitor closely with ongoing diuresis   DVT prophylaxis:Lovenox Code Status:Full Family Communication:Called daughter 1/18 Disposition Plan:Continue diuresis and monitor ongoing weights and fluid balance.  Continue to monitor closely on fall precautions with PT evaluation ordered.  Wean oxygen as tolerated.   Consultants:  None  Procedures:  None  Antimicrobials:   None  Subjective: Patient seen and evaluated today with no new acute complaints or concerns. No acute concerns or events noted overnight.  She is eager to go home.  Objective: Vitals:   11/03/19 0514 11/03/19 1340 11/03/19 2100 11/04/19 0648  BP: 117/67 (!) 105/59 (!) 105/54 109/60  Pulse: 80 84 85 81  Resp:  18  16 16 20   Temp: 98.3 F (36.8 C) 97.9 F (36.6 C) 97.8 F (36.6 C) 97.9 F (36.6 C)  TempSrc:  Oral Oral Oral  SpO2: 100% 92% 99% 95%  Weight:      Height:        Intake/Output Summary (Last 24 hours) at 11/04/2019 1010 Last data filed at 11/04/2019 0939 Gross per 24 hour  Intake 1080 ml  Output 2800 ml  Net -1720 ml   Filed Weights   11/01/19 0653 11/02/19 0541 11/03/19 0500  Weight: 57.9 kg 53.2 kg 53.1 kg    Examination:  General exam: Appears calm and comfortable  Respiratory system: Clear to auscultation. Respiratory effort normal.  Currently on 1 L nasal cannula oxygen. Cardiovascular system: S1 & S2 heard, RRR. No JVD, murmurs, rubs, gallops or clicks. No pedal edema. Gastrointestinal system: Abdomen is nondistended, soft and nontender. No organomegaly or masses felt. Normal bowel sounds heard. Central nervous system: Alert and oriented. No focal neurological deficits. Extremities: Symmetric 5 x 5 power. Skin: No rashes, lesions or ulcers Psychiatry: Judgement and insight appear normal. Mood & affect appropriate.     Data Reviewed: I have personally reviewed following labs and imaging studies  CBC: Recent Labs  Lab 10/31/19 1808  WBC 7.6  HGB 14.7  HCT 48.3*  MCV 92.0  PLT 902   Basic Metabolic Panel: Recent Labs  Lab 10/31/19 1808 11/01/19 0404 11/02/19 0847 11/03/19 0704 11/04/19 0634  NA 137 138 137 135 134*  K 3.2* 3.4* 4.2 4.5 4.7  CL 103 105 98 96* 90*  CO2 21* 23 27 31  33*  GLUCOSE 84 85 81 96 97  BUN 13 13 13 13 16   CREATININE 0.74 0.67 0.79 0.70 0.84  CALCIUM 8.8* 8.4* 8.8* 8.4* 8.7*  MG 1.9  --  1.5* 1.8 1.7   GFR: Estimated Creatinine Clearance: 37.6 mL/min (by C-G formula based on SCr of 0.84 mg/dL). Liver Function Tests: Recent Labs  Lab 10/31/19 1808 11/02/19 0847  AST 53* 38  ALT 39 30  ALKPHOS 128* 95  BILITOT 1.2 1.1  PROT 6.9 5.7*  ALBUMIN 2.9* 2.4*   No results for input(s): LIPASE, AMYLASE in the last 168  hours. No results for input(s): AMMONIA in the last 168 hours. Coagulation Profile: No results for input(s): INR, PROTIME in the last 168 hours. Cardiac Enzymes: No results for input(s): CKTOTAL, CKMB, CKMBINDEX, TROPONINI in the last 168 hours. BNP (last 3 results) No results for input(s): PROBNP in the last 8760 hours. HbA1C: No results for input(s): HGBA1C in the last 72 hours. CBG: No results for input(s): GLUCAP in the last 168 hours. Lipid Profile: No results for input(s): CHOL, HDL, LDLCALC, TRIG, CHOLHDL, LDLDIRECT in the last 72 hours. Thyroid Function Tests: No results for input(s): TSH, T4TOTAL, FREET4, T3FREE, THYROIDAB in the last 72 hours. Anemia Panel: No results for input(s): VITAMINB12, FOLATE, FERRITIN, TIBC, IRON, RETICCTPCT in the last 72 hours. Sepsis Labs: Recent Labs  Lab 10/31/19 2143  PROCALCITON <0.10    Recent Results (from the past 240 hour(s))  SARS CORONAVIRUS 2 (TAT 6-24 HRS) Nasopharyngeal Nasopharyngeal Swab     Status: None   Collection Time: 10/31/19  7:48 PM   Specimen: Nasopharyngeal Swab  Result Value Ref Range Status   SARS Coronavirus 2 NEGATIVE NEGATIVE Final    Comment: (NOTE) SARS-CoV-2 target nucleic acids are NOT DETECTED. The SARS-CoV-2 RNA is generally detectable in upper and lower respiratory specimens during the acute phase of  infection. Negative results do not preclude SARS-CoV-2 infection, do not rule out co-infections with other pathogens, and should not be used as the sole basis for treatment or other patient management decisions. Negative results must be combined with clinical observations, patient history, and epidemiological information. The expected result is Negative. Fact Sheet for Patients: HairSlick.no Fact Sheet for Healthcare Providers: quierodirigir.com This test is not yet approved or cleared by the [place] FDA and  has been authorized for detection  and/or diagnosis of SARS-CoV-2 by FDA under an Emergency Use Authorization (EUA). This EUA will remain  in effect (meaning this test can be used) for the duration of the COVID-19 declaration under Section 56 4(b)(1) of the Act, 21 U.S.C. section 360bbb-3(b)(1), unless the authorization is terminated or revoked sooner. Performed at [facility], [address] [place], [place] [address]          Radiology Studies: CT HEAD WO CONTRAST  Result Date: 11/03/2019 CLINICAL DATA:  Altered mental status. Unwitnessed fall. EXAM: CT HEAD WITHOUT CONTRAST TECHNIQUE: Contiguous axial images were obtained from the base of the skull through the vertex without intravenous contrast. COMPARISON:  Head CT 08/27/2019 FINDINGS: Brain: No intracranial hemorrhage, mass effect, or midline shift. Mild atrophy, normal for age. No hydrocephalus. The basilar cisterns are patent. Mild chronic small vessel ischemia. No evidence of territorial infarct or acute ischemia. No extra-axial or intracranial fluid collection. Vascular: No hyperdense vessel. Skull: No fracture or focal lesion. Sinuses/Orbits: Paranasal sinuses and mastoid air cells are clear. The visualized orbits are unremarkable. Other: None. IMPRESSION: No acute intracranial abnormality.  No skull fracture. Electronically Signed   By: Narda Rutherford M.D.   On: 11/03/2019 02:45        Scheduled Meds: . enoxaparin (LOVENOX) injection  40 mg Subcutaneous Q24H  . furosemide  40 mg Intravenous Q12H  . nystatin   Topical TID  . pantoprazole  40 mg Oral Daily  . potassium chloride  40 mEq Oral BID  . sertraline  25 mg Oral q morning - 10a  . spironolactone  50 mg Oral Daily  . thyroid  180 mg Oral QAC breakfast   Continuous Infusions:   LOS: 4 days    Time spent: 30 minutes    Kemari Mares Hoover Brunette, DO [facility] Pager [phone]  If 7PM-7AM, please contact night-coverage [email or web address] Password [email or web address] 11/04/2019, 10:10 AM

## 2019-11-04 NOTE — Patient Outreach (Signed)
Noted pt admitted to [facility] 10/31/19 for acute pulmonary edema, will continue to follow.  Irving Shows [facility], BSN [facility] Coordinator [phone]

## 2019-11-04 NOTE — Care Management Important Message (Signed)
Important Message  Patient Details  Name: [name] MRN: [id number] Date of Birth: 02-12-1933   Medicare Important Message Given:  Yes     Corey Harold 11/04/2019, 1:12 PM

## 2019-11-04 NOTE — Evaluation (Signed)
Physical Therapy Evaluation Patient Details Name: [name] MRN: [id number] DOB: 1933-08-27 Today's Date: 11/04/2019   History of Present Illness  [name] is a 86 y.o. female with medical history significant for liver cirrhosis, portal hypertension, GI bleed, CHF, hypertension, Sjogren's.Patient was sent to the ED from gastroenterologist office, reports of difficulty breathing of 2 weeks duration, generalized swelling involving her abdomen and legs.  She denies chest pain.  Difficulty breathing is only with exertion, not present at rest.  No cough.  No fevers or chills.  Patient reports compliance with her medications which includes Lasix 20 mg and spironolactone.  She also reports compliance with a low-salt diet.  She denies black stools or blood in her stools.    Clinical Impression  Patient functioning near baseline for functional mobility and gait, unsteady on feet requiring occasional hand held assist due to staggering left/right without loss of balance, on room air during ambulation with SpO2 dropping from 91% to 80% and put back on 2 LPM once seated in chair with SpO2 increasing above 95%.  Patient tolerated sitting up in chair after therapy - NT aware.  Patient will benefit from continued physical therapy in hospital and recommended venue below to increase strength, balance, endurance for safe ADLs and gait.      Follow Up Recommendations Home health PT;Supervision for mobility/OOB;Supervision - Intermittent    Equipment Recommendations  None recommended by PT    Recommendations for Other Services       Precautions / Restrictions Precautions Precautions: Fall Restrictions Weight Bearing Restrictions: No      Mobility  Bed Mobility Overal bed mobility: Modified Independent             General bed mobility comments: increased time, slightly labored movement  Transfers Overall transfer level: Needs assistance Equipment used: 1 person hand held  assist;None Transfers: Sit to/from [facility] Sit to Stand: Min guard Stand pivot transfers: Min guard       General transfer comment: increased time labored movement for transferring to [facility] and chair  Ambulation/Gait Ambulation/Gait assistance: Min guard;Min assist Gait Distance (Feet): 50 Feet Assistive device: 1 person hand held assist;None Gait Pattern/deviations: Decreased step length - right;Decreased step length - left;Decreased stride length;Staggering left;Staggering right Gait velocity: decreased   General Gait Details: slow slightly labored cadence with occasional staggering right/left, requires verbal cues to let arms swing with fair/good carryover, no loss of balance, limited secondary to c/o fatigue, on room air with SpO2 dropping from 91% to 80%  Stairs            Wheelchair Mobility    Modified Rankin (Stroke Patients Only)       Balance Overall balance assessment: Needs assistance Sitting-balance support: Feet supported;No upper extremity supported Sitting balance-Leahy Scale: Good Sitting balance - Comments: seated at EOB   Standing balance support: During functional activity;No upper extremity supported Standing balance-Leahy Scale: Fair Standing balance comment: fair static standing, fair/poor dynamic                             Pertinent Vitals/Pain Pain Assessment: No/denies pain    Home Living Family/patient expects to be discharged to:: Private residence Living Arrangements: Alone Available Help at Discharge: Family;Available 24 hours/day Type of Home: House Home Access: Stairs to enter Entrance Stairs-Rails: None Entrance Stairs-Number of Steps: 1 at backdoor, 2 at frontdoor Home Layout: One level Home Equipment: Cane - single point;Walker - 2 wheels;Shower  seat;Bedside commode;Wheelchair - manual      Prior Function Level of Independence: Independent         Comments: household and short distanced  community     Higher education careers adviser        Extremity/Trunk Assessment   Upper Extremity Assessment Upper Extremity Assessment: Overall WFL for tasks assessed    Lower Extremity Assessment Lower Extremity Assessment: Generalized weakness    Cervical / Trunk Assessment Cervical / Trunk Assessment: Normal  Communication   Communication: No difficulties  Cognition Arousal/Alertness: Awake/alert Behavior During Therapy: WFL for tasks assessed/performed Overall Cognitive Status: Within Functional Limits for tasks assessed                                        General Comments      Exercises     Assessment/Plan    PT Assessment Patient needs continued PT services  PT Problem List Decreased strength;Decreased activity tolerance;Decreased balance;Decreased mobility       PT Treatment Interventions Balance training;Gait training;Stair training;Functional mobility training;Therapeutic activities;Therapeutic exercise;Patient/family education    PT Goals (Current goals can be found in the Care Plan section)  Acute Rehab PT Goals Patient Stated Goal: return home with family to assist PT Goal Formulation: With patient Time For Goal Achievement: 11/08/19 Potential to Achieve Goals: Good    Frequency Min 3X/week   Barriers to discharge        Co-evaluation               AM-PAC PT "6 Clicks" Mobility  Outcome Measure Help needed turning from your back to your side while in a flat bed without using bedrails?: None Help needed moving from lying on your back to sitting on the side of a flat bed without using bedrails?: None Help needed moving to and from a bed to a chair (including a wheelchair)?: A Little Help needed standing up from a chair using your arms (e.g., wheelchair or bedside chair)?: A Little Help needed to walk in hospital room?: A Little Help needed climbing 3-5 steps with a railing? : A Lot 6 Click Score: 19    End of Session   Activity  Tolerance: Patient tolerated treatment well;Patient limited by fatigue Patient left: in chair;with call bell/phone within reach Nurse Communication: Mobility status PT Visit Diagnosis: Other abnormalities of gait and mobility (R26.89);Unsteadiness on feet (R26.81);Muscle weakness (generalized) (M62.81)    Time: 4742-5956 PT Time Calculation (min) (ACUTE ONLY): 32 min   Charges:   PT Evaluation $PT Eval Moderate Complexity: 1 Mod PT Treatments $Therapeutic Activity: 23-37 mins        3:06 PM, 11/04/19 Ocie Bob, MPT Physical Therapist with [facility] [phone] office [phone] mobile phone

## 2019-11-05 LAB — BASIC METABOLIC PANEL
Anion gap: 10 (ref 5–15)
BUN: 19 mg/dL (ref 8–23)
CO2: 34 mmol/L — ABNORMAL HIGH (ref 22–32)
Calcium: 9.1 mg/dL (ref 8.9–10.3)
Chloride: 89 mmol/L — ABNORMAL LOW (ref 98–111)
Creatinine, Ser: 0.89 mg/dL (ref 0.44–1.00)
GFR calc Af Amer: >60 mL/min (ref >60)
GFR calc non Af Amer: 60 mL/min — ABNORMAL LOW (ref >60)
Glucose, Bld: 99 mg/dL (ref 70–99)
Potassium: 5.1 mmol/L (ref 3.5–5.1)
Sodium: 133 mmol/L — ABNORMAL LOW (ref 135–145)

## 2019-11-05 LAB — MAGNESIUM: Magnesium: 1.8 mg/dL (ref 1.7–2.4)

## 2019-11-05 NOTE — TOC Transition Note (Addendum)
Transition of Care [facility]) - CM/SW Discharge Note   Patient Details  Name: [name] MRN: [id number] Date of Birth: 05-20-1933  Transition of Care [facility]) CM/SW Contact:  Leitha Bleak, RN Phone Number: 11/05/2019, 10:27 AM   Clinical Narrative:   Patient discharging home today. [name] and patient is now agreeing to [facility]. Choices given, Called Alroy Bailiff with [facility] for HHPT.  Addendum:  MD adding RN orders for Home Health.  Patient has CHF, needing education, 5 visits in 6 months.    Final next level of care: Home w Home Health Services Barriers to Discharge: Barriers Resolved   Patient Goals and CMS Choice Patient states their goals for this hospitalization and ongoing recovery are:: to go home. CMS Medicare.gov Compare Post Acute Care list provided to:: Patient Choice offered to / list presented to : Patient  Discharge Placement        Patient to be transferred to facility by: Daughter Name of family member notified: [name] Patient and family notified of of transfer: 11/05/19  Discharge Plan and Services      HH Arranged: PT [facility] Agency: [facility] Date HH Agency Contacted: 11/05/19 Time HH Agency Contacted: [phone] Representative spoke with at [facility] Agency: Alroy Bailiff   Readmission Risk Interventions Readmission Risk Prevention Plan 11/04/2019  Transportation Screening Complete  Medication Review Oceanographer) Complete  PCP or Specialist appointment within 3-5 days of discharge Not Complete  HRI or Home Care Consult Complete  SW Recovery Care/Counseling Consult Complete  Palliative Care Screening Not Complete  Skilled Nursing Facility Not Complete  Some recent data might be hidden

## 2019-11-05 NOTE — Progress Notes (Signed)
Physical Therapy Treatment Patient Details Name: [name] MRN: [id number] DOB: 08/24/1933 Today's Date: 11/05/2019    History of Present Illness [name] is a 86 y.o. female with medical history significant for liver cirrhosis, portal hypertension, GI bleed, CHF, hypertension, Sjogren's.Patient was sent to the ED from gastroenterologist office, reports of difficulty breathing of 2 weeks duration, generalized swelling involving her abdomen and legs.  She denies chest pain.  Difficulty breathing is only with exertion, not present at rest.  No cough.  No fevers or chills.  Patient reports compliance with her medications which includes Lasix 20 mg and spironolactone.  She also reports compliance with a low-salt diet.  She denies black stools or blood in her stools.    PT Comments    Pt limited by fatigue, decreased O2 saturation without nasal cannula assistance and noted bradycardia with pulse range from 25-100 bpm.  RN aware of vitals.  Decreased O2 saturation to 78% upon standing from restroom, appllied 2 L O2 A via nasal cannula with range from 91-97%.  Pt asymptomatic to vitals with no reports of SOB through session.  Gait training complete with 1 HHA/no AD, able to ambulate slightly further distance than last session.  Some staggered gait initially, improved with distance and no LOB episodes.  EOS pt left in chair with call bell within reach.   RN aware of vitals and status.   Follow Up Recommendations  Home health PT;Supervision for mobility/OOB;Supervision - Intermittent     Equipment Recommendations  None recommended by PT    Recommendations for Other Services       Precautions / Restrictions Precautions Precautions: Fall Restrictions Weight Bearing Restrictions: No    Mobility  Bed Mobility Overal bed mobility: Modified Independent             General bed mobility comments: increased time, slightly labored movement  Transfers Overall transfer level: Needs  assistance Equipment used: 1 person hand held assist;None Transfers: Sit to/from Stand Sit to Stand: Min guard         General transfer comment: Decreased saturation following restroom break from 97% to 78%, O2 reapplied with increased satuation to 96% wiht 2L O2 A via nasal cannula  Ambulation/Gait Ambulation/Gait assistance: Min guard;Min assist Gait Distance (Feet): 80 Feet Assistive device: 1 person hand held assist;None Gait Pattern/deviations: Decreased step length - right;Decreased step length - left;Decreased stride length;Staggering left;Staggering right     General Gait Details: slow slightly labored cadence with occasional staggering right/left, requires verbal cues to let arms swing with fair/good carryover, no loss of balance, limited secondary to c/o fatigue, on room air with SpO2 dropping from 96% to 78%, 2L via nasal cannula reapplied.   Stairs             Wheelchair Mobility    Modified Rankin (Stroke Patients Only)       Balance                                            Cognition Arousal/Alertness: Awake/alert Behavior During Therapy: WFL for tasks assessed/performed Overall Cognitive Status: Within Functional Limits for tasks assessed                                 General Comments: Pt asymptomatic to vitals, no c/o SOB  Exercises General Exercises - Lower Extremity Ankle Circles/Pumps: 10 reps;Seated Long Arc Quad: 10 reps;Seated Hip Flexion/Marching: Both;10 reps;Seated Toe Raises: Both;10 reps;Seated Heel Raises: Both;10 reps;Seated    General Comments        Pertinent Vitals/Pain Pain Assessment: No/denies pain    Home Living                      Prior Function            PT Goals (current goals can now be found in the care plan section)      Frequency    Min 3X/week      PT Plan      Co-evaluation              AM-PAC PT "6 Clicks" Mobility   Outcome  Measure  Help needed turning from your back to your side while in a flat bed without using bedrails?: None Help needed moving from lying on your back to sitting on the side of a flat bed without using bedrails?: None Help needed moving to and from a bed to a chair (including a wheelchair)?: A Little Help needed standing up from a chair using your arms (e.g., wheelchair or bedside chair)?: A Little Help needed to walk in hospital room?: A Little Help needed climbing 3-5 steps with a railing? : A Lot 6 Click Score: 19    End of Session Equipment Utilized During Treatment: Gait belt Activity Tolerance: Patient tolerated treatment well;Patient limited by fatigue(vitals) Patient left: in chair;with call bell/phone within reach;Other (comment) Nurse Communication: Mobility status PT Visit Diagnosis: Other abnormalities of gait and mobility (R26.89);Unsteadiness on feet (R26.81);Muscle weakness (generalized) (M62.81)     Time: 8242-3536 PT Time Calculation (min) (ACUTE ONLY): 28 min  Charges:  $Therapeutic Activity: 23-37 mins                     [address], LPTA; CBIS [phone]  Aldona Lento 11/05/2019, 11:36 AM

## 2019-11-05 NOTE — Progress Notes (Signed)
Recheck of O2 saturation after O2 probe change, sat 99% on room air.  Patient w/o c/o SOB.

## 2019-11-05 NOTE — Progress Notes (Signed)
Discharge instruction reviewed with patient and daughter.  Both verbalized understanding of instructions.  Patient discharged home with daughter in stable condition.

## 2019-11-05 NOTE — Discharge Summary (Signed)
Physician Discharge Summary  [name] SEG:[id number] DOB: 11-05-1932 DOA: 10/31/2019  PCP: Assunta Found, MD  Admit date: 10/31/2019  Discharge date: 11/05/2019  Admitted From:Home  Disposition:  Home  Recommendations for Outpatient Follow-up:  1. Follow up with PCP in 1-2 weeks 2. Please obtain BMP/CBC in one week 3. Continue on home diuretics as prior 4. Documented weight of 89 pounds on day of discharge, but doubt accuracy  Home Health: Yes with PT  Equipment/Devices: None  Discharge Condition: Stable  CODE STATUS: Full  Diet recommendation: Heart Healthy  Brief/Interim Summary: Per HPI: [name] a 86 y.o.femalewith medical history significant forliver cirrhosis, portal hypertension, GI bleed, CHF, hypertension, Sjogren's. Patient was sent to the ED from gastroenterologist office, reports of difficulty breathing of 2 weeks duration, generalized swelling involving her abdomenandlegs.She denies chest pain. Difficulty breathing is only with exertion,not present at rest. No cough. No fevers or chills. Patient reports compliance with her medications which includes Lasix 20 mg and spironolactone. She also reports compliance with a low-salt diet. She denies black stools or blood in her stools.  4recent hospitalizationsfor symptomatic anemia due to diverticular GI bleed requiring transfusions Nov 2020 - Dec/ 2020.  1/15:Patient was admitted with decompensated diastolic heart failure and appears to have had a 30 pound weight gain. Procalcitonin has remained negative and troponin trend has remained low and stable. She has diuresed 1.3 L of fluid and appears to be doing well with Lasix 40 mg twice daily. Will work on weaning oxygen as tolerated to room air.  1/16:Patient continues to diurese quite well and is currently on 1 L nasal cannula oxygen. 3.2 L negative fluid balance noted overnight. Continue ongoing Lasix. Supplement  magnesium.  1/17:Patient continues to diurese quite well today with stable creatinine levels. Continue to wean oxygen and have PT evaluation and maintain on fall precautions as she did have a fall last night, but with no significant injuries noted. CT of the head with no acute findings. She is alert and oriented this morning. Continue ongoing Lasix for diuresis.  1/18: Patient continues to diurese well with approximately 2 L net negative fluid balance over the last 24 hours.  She continues to have stable renal function and is still on 1 L nasal cannula.  Requires ongoing diuresis as her weight is currently 117 pounds and her baseline weight is near 105 pounds.  1/19: Patient has diuresed another liter of fluid overnight and weights are currently documented at 89 pounds, but accuracy is doubtful.  Either way, she is stable and ready for discharge and is no longer requiring any oxygen.  She will have home health with physical therapy arranged with discharge.  Discharge Diagnoses:  Active Problems:   Decompensated heart failure (HCC)  Principal discharge diagnosis: Acute hypoxemic respiratory failure secondary to decompensated diastolic heart failure.  Discharge Instructions  Discharge Instructions    Diet - low sodium heart healthy   Complete by: As directed    Increase activity slowly   Complete by: As directed      Allergies as of 11/05/2019      Reactions   Ciprofloxacin Other (See Comments)   NO REACTION LISTED   Codeine    Unknown ( per daughter)   Demerol    Unknown ( per daughter)   Penicillins    Did it involve swelling of the face/tongue/throat, SOB, or low BP? Unknown Did it involve sudden or severe rash/hives, skin peeling, or any reaction on the inside of your mouth  or nose? Unknown Did you need to seek medical attention at a hospital or doctor's office? Yes When did it last happen?Uknown If all above answers are "NO", may proceed with cephalosporin use. No  issue with ceftriaxone   Sulfa Antibiotics    Unknown ( per daughter)   Trazodone And Nefazodone Other (See Comments)   Confusion and hallucinations      Medication List    TAKE these medications   BENEFIBER DRINK MIX PO Take 1 packet by mouth daily. With breakfast   Calcium-Magnesium-Zinc 333-133-5 MG Tabs Take 1 tablet by mouth every morning.   co-enzyme Q-10 30 MG capsule Take 30 mg by mouth daily.   esomeprazole 20 MG capsule Commonly known as: NEXIUM Take 1 capsule (20 mg total) by mouth at bedtime.   FISH OIL PO Take 1 capsule by mouth daily.   furosemide 20 MG tablet Commonly known as: Lasix Take 1 tablet (20 mg total) by mouth daily.   GAS-X PO Take 2 tablets by mouth every evening.   Iron 325 (65 Fe) MG Tabs Take 1 tablet (325 mg total) by mouth 2 (two) times daily before lunch and supper.   loperamide 2 MG capsule Commonly known as: IMODIUM Take 2 mg by mouth See admin instructions. 1 tablet in the morning and 2 tablets at bedtime   multivitamin with minerals Tabs tablet Take 1 tablet by mouth every morning.   sertraline 25 MG tablet Commonly known as: ZOLOFT Take 25 mg by mouth every morning.   spironolactone 50 MG tablet Commonly known as: Aldactone Take 1 tablet (50 mg total) by mouth daily.   thyroid 180 MG tablet Commonly known as: ARMOUR Take 180 mg by mouth daily before breakfast.   Turmeric 400 MG Caps Take 400 mg by mouth every morning.   vitamin B-12 250 MCG tablet Commonly known as: CYANOCOBALAMIN Take 250 mcg by mouth every morning.   Vitamin C 500 MG Caps Take by mouth daily.   Vitamin D3 10 MCG (400 UNIT) Caps Take 1,000 mg by mouth daily.      Follow-up Information    Sharilyn Sites, MD Follow up in 1 week(s).   Specialty: Family Medicine Contact information: [place] [address] [phone]          Allergies  Allergen Reactions  . Ciprofloxacin Other (See Comments)    NO REACTION  LISTED  . Codeine     Unknown ( per daughter)  . Demerol     Unknown ( per daughter)  . Penicillins     Did it involve swelling of the face/tongue/throat, SOB, or low BP? Unknown Did it involve sudden or severe rash/hives, skin peeling, or any reaction on the inside of your mouth or nose? Unknown Did you need to seek medical attention at a hospital or doctor's office? Yes When did it last happen?Uknown If all above answers are "NO", may proceed with cephalosporin use. No issue with ceftriaxone  . Sulfa Antibiotics     Unknown ( per daughter)  . Trazodone And Nefazodone Other (See Comments)    Confusion and hallucinations    Consultations:  None   Procedures/Studies: DG Chest 2 View  Result Date: 10/31/2019 CLINICAL DATA:  Shortness of breath EXAM: CHEST - 2 VIEW COMPARISON:  09/17/2019 FINDINGS: Cardiomegaly with vascular congestion. Atelectasis in the right mid lung. Bibasilar opacities could reflect atelectasis or infiltrates. Suspect small effusions. No acute bony abnormality. IMPRESSION: Cardiomegaly, vascular congestion. Small bilateral effusions. Right mid lung atelectasis.  Bibasilar atelectasis or infiltrates. Electronically Signed   By: Charlett Nose M.D.   On: 10/31/2019 18:39   CT HEAD WO CONTRAST  Result Date: 11/03/2019 CLINICAL DATA:  Altered mental status. Unwitnessed fall. EXAM: CT HEAD WITHOUT CONTRAST TECHNIQUE: Contiguous axial images were obtained from the base of the skull through the vertex without intravenous contrast. COMPARISON:  Head CT 08/27/2019 FINDINGS: Brain: No intracranial hemorrhage, mass effect, or midline shift. Mild atrophy, normal for age. No hydrocephalus. The basilar cisterns are patent. Mild chronic small vessel ischemia. No evidence of territorial infarct or acute ischemia. No extra-axial or intracranial fluid collection. Vascular: No hyperdense vessel. Skull: No fracture or focal lesion. Sinuses/Orbits: Paranasal sinuses and mastoid air  cells are clear. The visualized orbits are unremarkable. Other: None. IMPRESSION: No acute intracranial abnormality.  No skull fracture. Electronically Signed   By: Narda Rutherford M.D.   On: 11/03/2019 02:45     Discharge Exam: Vitals:   11/05/19 0501 11/05/19 0803  BP: (!) 115/51   Pulse: 79   Resp: 18   Temp: 97.8 F (36.6 C)   SpO2: 95% 96%   Vitals:   11/04/19 2038 11/05/19 0501 11/05/19 0522 11/05/19 0803  BP: (!) 115/51 (!) 115/51    Pulse: 91 79    Resp: 19 18    Temp: 97.9 F (36.6 C) 97.8 F (36.6 C)    TempSrc: Oral Oral    SpO2: 95% 95%  96%  Weight:   40.4 kg   Height:        General: Pt is alert, awake, not in acute distress Cardiovascular: RRR, S1/S2 +, no rubs, no gallops Respiratory: CTA bilaterally, no wheezing, no rhonchi Abdominal: Soft, NT, ND, bowel sounds + Extremities: no edema, no cyanosis    The results of significant diagnostics from this hospitalization (including imaging, microbiology, ancillary and laboratory) are listed below for reference.     Microbiology: Recent Results (from the past 240 hour(s))  SARS CORONAVIRUS 2 (TAT 6-24 HRS) Nasopharyngeal Nasopharyngeal Swab     Status: None   Collection Time: 10/31/19  7:48 PM   Specimen: Nasopharyngeal Swab  Result Value Ref Range Status   SARS Coronavirus 2 NEGATIVE NEGATIVE Final    Comment: (NOTE) SARS-CoV-2 target nucleic acids are NOT DETECTED. The SARS-CoV-2 RNA is generally detectable in upper and lower respiratory specimens during the acute phase of infection. Negative results do not preclude SARS-CoV-2 infection, do not rule out co-infections with other pathogens, and should not be used as the sole basis for treatment or other patient management decisions. Negative results must be combined with clinical observations, patient history, and epidemiological information. The expected result is Negative. Fact Sheet for Patients: [employer] Fact  Sheet for Healthcare Providers: quierodirigir.com This test is not yet approved or cleared by the [place] FDA and  has been authorized for detection and/or diagnosis of SARS-CoV-2 by FDA under an Emergency Use Authorization (EUA). This EUA will remain  in effect (meaning this test can be used) for the duration of the COVID-19 declaration under Section 56 4(b)(1) of the Act, 21 U.S.C. section 360bbb-3(b)(1), unless the authorization is terminated or revoked sooner. Performed at [facility], [address], [place], [place] [address]      Labs: BNP (last 3 results) Recent Labs    09/17/19 1648 10/31/19 1808  BNP 1,760.0* 1,645.0*   Basic Metabolic Panel: Recent Labs  Lab 10/31/19 1808 10/31/19 1808 11/01/19 0404 11/02/19 0932 11/03/19 6712 11/04/19 4580 11/05/19 0631  NA 137   < > 138 137 135 134* 133*  K 3.2*   < > 3.4* 4.2 4.5 4.7 5.1  CL 103   < > 105 98 96* 90* 89*  CO2 21*   < > 23 27 31  33* 34*  GLUCOSE 84   < > 85 81 96 97 99  BUN 13   < > 13 13 13 16 19   CREATININE 0.74   < > 0.67 0.79 0.70 0.84 0.89  CALCIUM 8.8*   < > 8.4* 8.8* 8.4* 8.7* 9.1  MG 1.9  --   --  1.5* 1.8 1.7 1.8   < > = values in this interval not displayed.   Liver Function Tests: Recent Labs  Lab 10/31/19 1808 11/02/19 0847  AST 53* 38  ALT 39 30  ALKPHOS 128* 95  BILITOT 1.2 1.1  PROT 6.9 5.7*  ALBUMIN 2.9* 2.4*   No results for input(s): LIPASE, AMYLASE in the last 168 hours. No results for input(s): AMMONIA in the last 168 hours. CBC: Recent Labs  Lab 10/31/19 1808  WBC 7.6  HGB 14.7  HCT 48.3*  MCV 92.0  PLT 225   Cardiac Enzymes: No results for input(s): CKTOTAL, CKMB, CKMBINDEX, TROPONINI in the last 168 hours. BNP: Invalid input(s): POCBNP CBG: No results for input(s): GLUCAP in the last 168 hours. D-Dimer No results for input(s): DDIMER in the last 72 hours. Hgb A1c No results for input(s): HGBA1C in the last 72  hours. Lipid Profile No results for input(s): CHOL, HDL, LDLCALC, TRIG, CHOLHDL, LDLDIRECT in the last 72 hours. Thyroid function studies No results for input(s): TSH, T4TOTAL, T3FREE, THYROIDAB in the last 72 hours.  Invalid input(s): FREET3 Anemia work up No results for input(s): VITAMINB12, FOLATE, FERRITIN, TIBC, IRON, RETICCTPCT in the last 72 hours. Urinalysis No results found for: COLORURINE, APPEARANCEUR, LABSPEC, PHURINE, GLUCOSEU, HGBUR, BILIRUBINUR, KETONESUR, PROTEINUR, UROBILINOGEN, NITRITE, LEUKOCYTESUR Sepsis Labs Invalid input(s): PROCALCITONIN,  WBC,  LACTICIDVEN Microbiology Recent Results (from the past 240 hour(s))  SARS CORONAVIRUS 2 (TAT 6-24 HRS) Nasopharyngeal Nasopharyngeal Swab     Status: None   Collection Time: 10/31/19  7:48 PM   Specimen: Nasopharyngeal Swab  Result Value Ref Range Status   SARS Coronavirus 2 NEGATIVE NEGATIVE Final    Comment: (NOTE) SARS-CoV-2 target nucleic acids are NOT DETECTED. The SARS-CoV-2 RNA is generally detectable in upper and lower respiratory specimens during the acute phase of infection. Negative results do not preclude SARS-CoV-2 infection, do not rule out co-infections with other pathogens, and should not be used as the sole basis for treatment or other patient management decisions. Negative results must be combined with clinical observations, patient history, and epidemiological information. The expected result is Negative. Fact Sheet for Patients: 11/02/19 Fact Sheet for Healthcare Providers: 11/02/19 This test is not yet approved or cleared by the [employer] FDA and  has been authorized for detection and/or diagnosis of SARS-CoV-2 by FDA under an Emergency Use Authorization (EUA). This EUA will remain  in effect (meaning this test can be used) for the duration of the COVID-19 declaration under Section 56 4(b)(1) of the Act, 21 U.S.C. section  360bbb-3(b)(1), unless the authorization is terminated or revoked sooner. Performed at [facility], [address], [place], [address] [place]      Time coordinating discharge: 35 minutes  SIGNED:   [place], DO [facility] 11/05/2019, 9:02 AM  If 7PM-7AM, please contact night-coverage [email or web address]

## 2019-11-06 DIAGNOSIS — I5030 Unspecified diastolic (congestive) heart failure: Secondary | ICD-10-CM | POA: Diagnosis not present

## 2019-11-06 DIAGNOSIS — K7460 Unspecified cirrhosis of liver: Secondary | ICD-10-CM | POA: Diagnosis not present

## 2019-11-06 DIAGNOSIS — M3500 Sicca syndrome, unspecified: Secondary | ICD-10-CM | POA: Diagnosis not present

## 2019-11-06 DIAGNOSIS — I5020 Unspecified systolic (congestive) heart failure: Secondary | ICD-10-CM | POA: Diagnosis not present

## 2019-11-06 DIAGNOSIS — Z9181 History of falling: Secondary | ICD-10-CM | POA: Diagnosis not present

## 2019-11-06 DIAGNOSIS — K766 Portal hypertension: Secondary | ICD-10-CM | POA: Diagnosis not present

## 2019-11-06 DIAGNOSIS — E039 Hypothyroidism, unspecified: Secondary | ICD-10-CM | POA: Diagnosis not present

## 2019-11-06 DIAGNOSIS — I110 Hypertensive heart disease with heart failure: Secondary | ICD-10-CM | POA: Diagnosis not present

## 2019-11-06 DIAGNOSIS — J9601 Acute respiratory failure with hypoxia: Secondary | ICD-10-CM | POA: Diagnosis not present

## 2019-11-07 ENCOUNTER — Other Ambulatory Visit: Payer: Self-pay | Admitting: *Deleted

## 2019-11-07 DIAGNOSIS — K7460 Unspecified cirrhosis of liver: Secondary | ICD-10-CM | POA: Diagnosis not present

## 2019-11-07 DIAGNOSIS — J9601 Acute respiratory failure with hypoxia: Secondary | ICD-10-CM | POA: Diagnosis not present

## 2019-11-07 DIAGNOSIS — K766 Portal hypertension: Secondary | ICD-10-CM | POA: Diagnosis not present

## 2019-11-07 DIAGNOSIS — I5030 Unspecified diastolic (congestive) heart failure: Secondary | ICD-10-CM | POA: Diagnosis not present

## 2019-11-07 DIAGNOSIS — E039 Hypothyroidism, unspecified: Secondary | ICD-10-CM | POA: Diagnosis not present

## 2019-11-07 DIAGNOSIS — I110 Hypertensive heart disease with heart failure: Secondary | ICD-10-CM | POA: Diagnosis not present

## 2019-11-07 NOTE — Patient Outreach (Signed)
Pt admitted on 10/31/19-11/05/19 for acute hypoxemic respiratory failure secondary to decompensated diastolic HF.  Pt with diagnoses cirrhosis of the liver with ascites, depression, HTN, acute on chronic anemia, history GI bleed, hypothyroidism, chronic diarrhea, CHF.  Primary care provider completes transition of care.  RN CM attempted previous screening but was unable to speak with pt.  Outreach call to patient for screening, spoke with daughter [name] who states pt not able to talk, RN CM gave contact phone number and ask that pt call RN CM back.  Daughter then states pt can talk and give permission to speak with daughter as she oversees pt care, pt talked with RN CM and gave permission to speak with daughter.  RN CM explained [facility] program and [name] states she will need to discuss further with pt and they will call back and let RN CM know.  PLAN If no response back, outreach pt/ daughter in 3-4 business days  Irving Shows [facility], BSN [facility] Care Coordinator [phone]

## 2019-11-08 DIAGNOSIS — I5030 Unspecified diastolic (congestive) heart failure: Secondary | ICD-10-CM | POA: Diagnosis not present

## 2019-11-08 DIAGNOSIS — Z681 Body mass index (BMI) 19 or less, adult: Secondary | ICD-10-CM | POA: Diagnosis not present

## 2019-11-12 DIAGNOSIS — I5030 Unspecified diastolic (congestive) heart failure: Secondary | ICD-10-CM | POA: Diagnosis not present

## 2019-11-12 DIAGNOSIS — K766 Portal hypertension: Secondary | ICD-10-CM | POA: Diagnosis not present

## 2019-11-12 DIAGNOSIS — E039 Hypothyroidism, unspecified: Secondary | ICD-10-CM | POA: Diagnosis not present

## 2019-11-12 DIAGNOSIS — J9601 Acute respiratory failure with hypoxia: Secondary | ICD-10-CM | POA: Diagnosis not present

## 2019-11-12 DIAGNOSIS — K7460 Unspecified cirrhosis of liver: Secondary | ICD-10-CM | POA: Diagnosis not present

## 2019-11-12 DIAGNOSIS — I110 Hypertensive heart disease with heart failure: Secondary | ICD-10-CM | POA: Diagnosis not present

## 2019-11-13 ENCOUNTER — Other Ambulatory Visit: Payer: Self-pay | Admitting: *Deleted

## 2019-11-13 NOTE — Patient Outreach (Signed)
Outreach call to pt/ daughter for screening/  3rd attempt.  No answer to telephone and no option to leave voicemail.  Patient's daughter was to discuss [facility] program with pt and call RN CM back as to whether pt is interested, RN CM did not receive a call back.  PLAN Close case in 3-4 business days  Irving Shows [facility], BSN [facility] Care Coordinator [phone]

## 2019-11-14 DIAGNOSIS — K7460 Unspecified cirrhosis of liver: Secondary | ICD-10-CM | POA: Diagnosis not present

## 2019-11-14 DIAGNOSIS — I110 Hypertensive heart disease with heart failure: Secondary | ICD-10-CM | POA: Diagnosis not present

## 2019-11-14 DIAGNOSIS — I5030 Unspecified diastolic (congestive) heart failure: Secondary | ICD-10-CM | POA: Diagnosis not present

## 2019-11-14 DIAGNOSIS — E039 Hypothyroidism, unspecified: Secondary | ICD-10-CM | POA: Diagnosis not present

## 2019-11-14 DIAGNOSIS — K766 Portal hypertension: Secondary | ICD-10-CM | POA: Diagnosis not present

## 2019-11-14 DIAGNOSIS — J9601 Acute respiratory failure with hypoxia: Secondary | ICD-10-CM | POA: Diagnosis not present

## 2019-11-18 ENCOUNTER — Other Ambulatory Visit: Payer: Self-pay | Admitting: *Deleted

## 2019-11-18 NOTE — Patient Outreach (Signed)
Case closure due to difficulty contacting pt and pt/ daughter did not agree to [facility] program.  Case closed  Irving Shows [facility], BSN [facility] Care Coordinator [phone]

## 2019-11-19 DIAGNOSIS — I110 Hypertensive heart disease with heart failure: Secondary | ICD-10-CM | POA: Diagnosis not present

## 2019-11-19 DIAGNOSIS — J9601 Acute respiratory failure with hypoxia: Secondary | ICD-10-CM | POA: Diagnosis not present

## 2019-11-19 DIAGNOSIS — I5030 Unspecified diastolic (congestive) heart failure: Secondary | ICD-10-CM | POA: Diagnosis not present

## 2019-11-19 DIAGNOSIS — K7460 Unspecified cirrhosis of liver: Secondary | ICD-10-CM | POA: Diagnosis not present

## 2019-11-19 DIAGNOSIS — K766 Portal hypertension: Secondary | ICD-10-CM | POA: Diagnosis not present

## 2019-11-19 DIAGNOSIS — E039 Hypothyroidism, unspecified: Secondary | ICD-10-CM | POA: Diagnosis not present

## 2019-11-20 DIAGNOSIS — I110 Hypertensive heart disease with heart failure: Secondary | ICD-10-CM | POA: Diagnosis not present

## 2019-11-20 DIAGNOSIS — J9601 Acute respiratory failure with hypoxia: Secondary | ICD-10-CM | POA: Diagnosis not present

## 2019-11-20 DIAGNOSIS — E039 Hypothyroidism, unspecified: Secondary | ICD-10-CM | POA: Diagnosis not present

## 2019-11-20 DIAGNOSIS — K7460 Unspecified cirrhosis of liver: Secondary | ICD-10-CM | POA: Diagnosis not present

## 2019-11-20 DIAGNOSIS — I5030 Unspecified diastolic (congestive) heart failure: Secondary | ICD-10-CM | POA: Diagnosis not present

## 2019-11-20 DIAGNOSIS — K766 Portal hypertension: Secondary | ICD-10-CM | POA: Diagnosis not present

## 2019-11-21 DIAGNOSIS — Z681 Body mass index (BMI) 19 or less, adult: Secondary | ICD-10-CM | POA: Diagnosis not present

## 2019-11-21 DIAGNOSIS — E039 Hypothyroidism, unspecified: Secondary | ICD-10-CM | POA: Diagnosis not present

## 2019-11-21 DIAGNOSIS — K7460 Unspecified cirrhosis of liver: Secondary | ICD-10-CM | POA: Diagnosis not present

## 2019-11-21 DIAGNOSIS — I5030 Unspecified diastolic (congestive) heart failure: Secondary | ICD-10-CM | POA: Diagnosis not present

## 2019-11-21 DIAGNOSIS — I110 Hypertensive heart disease with heart failure: Secondary | ICD-10-CM | POA: Diagnosis not present

## 2019-11-21 DIAGNOSIS — J9601 Acute respiratory failure with hypoxia: Secondary | ICD-10-CM | POA: Diagnosis not present

## 2019-11-21 DIAGNOSIS — K766 Portal hypertension: Secondary | ICD-10-CM | POA: Diagnosis not present

## 2019-11-23 DIAGNOSIS — K7460 Unspecified cirrhosis of liver: Secondary | ICD-10-CM | POA: Diagnosis not present

## 2019-11-23 DIAGNOSIS — K766 Portal hypertension: Secondary | ICD-10-CM | POA: Diagnosis not present

## 2019-11-23 DIAGNOSIS — I5030 Unspecified diastolic (congestive) heart failure: Secondary | ICD-10-CM | POA: Diagnosis not present

## 2019-11-23 DIAGNOSIS — J9601 Acute respiratory failure with hypoxia: Secondary | ICD-10-CM | POA: Diagnosis not present

## 2019-11-23 DIAGNOSIS — E039 Hypothyroidism, unspecified: Secondary | ICD-10-CM | POA: Diagnosis not present

## 2019-11-23 DIAGNOSIS — I110 Hypertensive heart disease with heart failure: Secondary | ICD-10-CM | POA: Diagnosis not present

## 2019-11-25 DIAGNOSIS — I110 Hypertensive heart disease with heart failure: Secondary | ICD-10-CM | POA: Diagnosis not present

## 2019-11-25 DIAGNOSIS — I5030 Unspecified diastolic (congestive) heart failure: Secondary | ICD-10-CM | POA: Diagnosis not present

## 2019-11-25 DIAGNOSIS — E039 Hypothyroidism, unspecified: Secondary | ICD-10-CM | POA: Diagnosis not present

## 2019-11-25 DIAGNOSIS — K766 Portal hypertension: Secondary | ICD-10-CM | POA: Diagnosis not present

## 2019-11-25 DIAGNOSIS — J9601 Acute respiratory failure with hypoxia: Secondary | ICD-10-CM | POA: Diagnosis not present

## 2019-11-25 DIAGNOSIS — K7460 Unspecified cirrhosis of liver: Secondary | ICD-10-CM | POA: Diagnosis not present

## 2019-11-27 DIAGNOSIS — I5030 Unspecified diastolic (congestive) heart failure: Secondary | ICD-10-CM | POA: Diagnosis not present

## 2019-11-27 DIAGNOSIS — E039 Hypothyroidism, unspecified: Secondary | ICD-10-CM | POA: Diagnosis not present

## 2019-11-27 DIAGNOSIS — K766 Portal hypertension: Secondary | ICD-10-CM | POA: Diagnosis not present

## 2019-11-27 DIAGNOSIS — K7460 Unspecified cirrhosis of liver: Secondary | ICD-10-CM | POA: Diagnosis not present

## 2019-11-27 DIAGNOSIS — I110 Hypertensive heart disease with heart failure: Secondary | ICD-10-CM | POA: Diagnosis not present

## 2019-11-27 DIAGNOSIS — J9601 Acute respiratory failure with hypoxia: Secondary | ICD-10-CM | POA: Diagnosis not present

## 2019-12-03 DIAGNOSIS — E039 Hypothyroidism, unspecified: Secondary | ICD-10-CM | POA: Diagnosis not present

## 2019-12-03 DIAGNOSIS — I5030 Unspecified diastolic (congestive) heart failure: Secondary | ICD-10-CM | POA: Diagnosis not present

## 2019-12-03 DIAGNOSIS — I110 Hypertensive heart disease with heart failure: Secondary | ICD-10-CM | POA: Diagnosis not present

## 2019-12-03 DIAGNOSIS — K7460 Unspecified cirrhosis of liver: Secondary | ICD-10-CM | POA: Diagnosis not present

## 2019-12-03 DIAGNOSIS — J9601 Acute respiratory failure with hypoxia: Secondary | ICD-10-CM | POA: Diagnosis not present

## 2019-12-03 DIAGNOSIS — K766 Portal hypertension: Secondary | ICD-10-CM | POA: Diagnosis not present

## 2019-12-06 DIAGNOSIS — K766 Portal hypertension: Secondary | ICD-10-CM | POA: Diagnosis not present

## 2019-12-06 DIAGNOSIS — M3500 Sicca syndrome, unspecified: Secondary | ICD-10-CM | POA: Diagnosis not present

## 2019-12-06 DIAGNOSIS — I110 Hypertensive heart disease with heart failure: Secondary | ICD-10-CM | POA: Diagnosis not present

## 2019-12-06 DIAGNOSIS — K7460 Unspecified cirrhosis of liver: Secondary | ICD-10-CM | POA: Diagnosis not present

## 2019-12-06 DIAGNOSIS — I5030 Unspecified diastolic (congestive) heart failure: Secondary | ICD-10-CM | POA: Diagnosis not present

## 2019-12-06 DIAGNOSIS — J9601 Acute respiratory failure with hypoxia: Secondary | ICD-10-CM | POA: Diagnosis not present

## 2019-12-06 DIAGNOSIS — Z9181 History of falling: Secondary | ICD-10-CM | POA: Diagnosis not present

## 2019-12-06 DIAGNOSIS — E039 Hypothyroidism, unspecified: Secondary | ICD-10-CM | POA: Diagnosis not present

## 2019-12-09 DIAGNOSIS — J9601 Acute respiratory failure with hypoxia: Secondary | ICD-10-CM | POA: Diagnosis not present

## 2019-12-09 DIAGNOSIS — I110 Hypertensive heart disease with heart failure: Secondary | ICD-10-CM | POA: Diagnosis not present

## 2019-12-09 DIAGNOSIS — E039 Hypothyroidism, unspecified: Secondary | ICD-10-CM | POA: Diagnosis not present

## 2019-12-09 DIAGNOSIS — K7460 Unspecified cirrhosis of liver: Secondary | ICD-10-CM | POA: Diagnosis not present

## 2019-12-09 DIAGNOSIS — I5030 Unspecified diastolic (congestive) heart failure: Secondary | ICD-10-CM | POA: Diagnosis not present

## 2019-12-09 DIAGNOSIS — K766 Portal hypertension: Secondary | ICD-10-CM | POA: Diagnosis not present

## 2019-12-10 DIAGNOSIS — K766 Portal hypertension: Secondary | ICD-10-CM | POA: Diagnosis not present

## 2019-12-10 DIAGNOSIS — I110 Hypertensive heart disease with heart failure: Secondary | ICD-10-CM | POA: Diagnosis not present

## 2019-12-10 DIAGNOSIS — I5030 Unspecified diastolic (congestive) heart failure: Secondary | ICD-10-CM | POA: Diagnosis not present

## 2019-12-10 DIAGNOSIS — K7460 Unspecified cirrhosis of liver: Secondary | ICD-10-CM | POA: Diagnosis not present

## 2019-12-10 DIAGNOSIS — E039 Hypothyroidism, unspecified: Secondary | ICD-10-CM | POA: Diagnosis not present

## 2019-12-10 DIAGNOSIS — J9601 Acute respiratory failure with hypoxia: Secondary | ICD-10-CM | POA: Diagnosis not present

## 2019-12-11 DIAGNOSIS — R062 Wheezing: Secondary | ICD-10-CM | POA: Diagnosis not present

## 2019-12-15 DIAGNOSIS — I10 Essential (primary) hypertension: Secondary | ICD-10-CM | POA: Diagnosis not present

## 2019-12-15 DIAGNOSIS — E7849 Other hyperlipidemia: Secondary | ICD-10-CM | POA: Diagnosis not present

## 2019-12-15 DIAGNOSIS — E039 Hypothyroidism, unspecified: Secondary | ICD-10-CM | POA: Diagnosis not present

## 2019-12-16 DIAGNOSIS — J9601 Acute respiratory failure with hypoxia: Secondary | ICD-10-CM | POA: Diagnosis not present

## 2019-12-16 DIAGNOSIS — K766 Portal hypertension: Secondary | ICD-10-CM | POA: Diagnosis not present

## 2019-12-16 DIAGNOSIS — I5030 Unspecified diastolic (congestive) heart failure: Secondary | ICD-10-CM | POA: Diagnosis not present

## 2019-12-16 DIAGNOSIS — I110 Hypertensive heart disease with heart failure: Secondary | ICD-10-CM | POA: Diagnosis not present

## 2019-12-16 DIAGNOSIS — K7460 Unspecified cirrhosis of liver: Secondary | ICD-10-CM | POA: Diagnosis not present

## 2019-12-16 DIAGNOSIS — E039 Hypothyroidism, unspecified: Secondary | ICD-10-CM | POA: Diagnosis not present

## 2019-12-18 DIAGNOSIS — J189 Pneumonia, unspecified organism: Secondary | ICD-10-CM | POA: Diagnosis not present

## 2019-12-18 DIAGNOSIS — Z1389 Encounter for screening for other disorder: Secondary | ICD-10-CM | POA: Diagnosis not present

## 2019-12-18 DIAGNOSIS — Z681 Body mass index (BMI) 19 or less, adult: Secondary | ICD-10-CM | POA: Diagnosis not present

## 2019-12-18 DIAGNOSIS — E039 Hypothyroidism, unspecified: Secondary | ICD-10-CM | POA: Diagnosis not present

## 2019-12-18 DIAGNOSIS — E538 Deficiency of other specified B group vitamins: Secondary | ICD-10-CM | POA: Diagnosis not present

## 2019-12-19 DIAGNOSIS — K7460 Unspecified cirrhosis of liver: Secondary | ICD-10-CM | POA: Diagnosis not present

## 2019-12-19 DIAGNOSIS — J9601 Acute respiratory failure with hypoxia: Secondary | ICD-10-CM | POA: Diagnosis not present

## 2019-12-19 DIAGNOSIS — I5030 Unspecified diastolic (congestive) heart failure: Secondary | ICD-10-CM | POA: Diagnosis not present

## 2019-12-19 DIAGNOSIS — E039 Hypothyroidism, unspecified: Secondary | ICD-10-CM | POA: Diagnosis not present

## 2019-12-19 DIAGNOSIS — I110 Hypertensive heart disease with heart failure: Secondary | ICD-10-CM | POA: Diagnosis not present

## 2019-12-19 DIAGNOSIS — K766 Portal hypertension: Secondary | ICD-10-CM | POA: Diagnosis not present

## 2019-12-20 DIAGNOSIS — K7460 Unspecified cirrhosis of liver: Secondary | ICD-10-CM | POA: Diagnosis not present

## 2019-12-20 DIAGNOSIS — J9601 Acute respiratory failure with hypoxia: Secondary | ICD-10-CM | POA: Diagnosis not present

## 2019-12-20 DIAGNOSIS — I5030 Unspecified diastolic (congestive) heart failure: Secondary | ICD-10-CM | POA: Diagnosis not present

## 2019-12-20 DIAGNOSIS — I110 Hypertensive heart disease with heart failure: Secondary | ICD-10-CM | POA: Diagnosis not present

## 2019-12-23 DIAGNOSIS — E039 Hypothyroidism, unspecified: Secondary | ICD-10-CM | POA: Diagnosis not present

## 2019-12-23 DIAGNOSIS — K7460 Unspecified cirrhosis of liver: Secondary | ICD-10-CM | POA: Diagnosis not present

## 2019-12-23 DIAGNOSIS — I5030 Unspecified diastolic (congestive) heart failure: Secondary | ICD-10-CM | POA: Diagnosis not present

## 2019-12-23 DIAGNOSIS — J9601 Acute respiratory failure with hypoxia: Secondary | ICD-10-CM | POA: Diagnosis not present

## 2019-12-23 DIAGNOSIS — K766 Portal hypertension: Secondary | ICD-10-CM | POA: Diagnosis not present

## 2019-12-23 DIAGNOSIS — I110 Hypertensive heart disease with heart failure: Secondary | ICD-10-CM | POA: Diagnosis not present

## 2019-12-24 ENCOUNTER — Other Ambulatory Visit (INDEPENDENT_AMBULATORY_CARE_PROVIDER_SITE_OTHER): Payer: Self-pay | Admitting: Gastroenterology

## 2019-12-25 DIAGNOSIS — K7460 Unspecified cirrhosis of liver: Secondary | ICD-10-CM | POA: Diagnosis not present

## 2019-12-25 DIAGNOSIS — K766 Portal hypertension: Secondary | ICD-10-CM | POA: Diagnosis not present

## 2019-12-25 DIAGNOSIS — J9601 Acute respiratory failure with hypoxia: Secondary | ICD-10-CM | POA: Diagnosis not present

## 2019-12-25 DIAGNOSIS — I5030 Unspecified diastolic (congestive) heart failure: Secondary | ICD-10-CM | POA: Diagnosis not present

## 2019-12-25 DIAGNOSIS — E039 Hypothyroidism, unspecified: Secondary | ICD-10-CM | POA: Diagnosis not present

## 2019-12-25 DIAGNOSIS — I110 Hypertensive heart disease with heart failure: Secondary | ICD-10-CM | POA: Diagnosis not present

## 2019-12-31 DIAGNOSIS — E039 Hypothyroidism, unspecified: Secondary | ICD-10-CM | POA: Diagnosis not present

## 2019-12-31 DIAGNOSIS — I5030 Unspecified diastolic (congestive) heart failure: Secondary | ICD-10-CM | POA: Diagnosis not present

## 2019-12-31 DIAGNOSIS — K7460 Unspecified cirrhosis of liver: Secondary | ICD-10-CM | POA: Diagnosis not present

## 2019-12-31 DIAGNOSIS — K766 Portal hypertension: Secondary | ICD-10-CM | POA: Diagnosis not present

## 2019-12-31 DIAGNOSIS — I110 Hypertensive heart disease with heart failure: Secondary | ICD-10-CM | POA: Diagnosis not present

## 2019-12-31 DIAGNOSIS — J9601 Acute respiratory failure with hypoxia: Secondary | ICD-10-CM | POA: Diagnosis not present

## 2020-01-05 DIAGNOSIS — E039 Hypothyroidism, unspecified: Secondary | ICD-10-CM | POA: Diagnosis not present

## 2020-01-05 DIAGNOSIS — I110 Hypertensive heart disease with heart failure: Secondary | ICD-10-CM | POA: Diagnosis not present

## 2020-01-05 DIAGNOSIS — I5030 Unspecified diastolic (congestive) heart failure: Secondary | ICD-10-CM | POA: Diagnosis not present

## 2020-01-05 DIAGNOSIS — K766 Portal hypertension: Secondary | ICD-10-CM | POA: Diagnosis not present

## 2020-01-05 DIAGNOSIS — M3500 Sicca syndrome, unspecified: Secondary | ICD-10-CM | POA: Diagnosis not present

## 2020-01-05 DIAGNOSIS — K7460 Unspecified cirrhosis of liver: Secondary | ICD-10-CM | POA: Diagnosis not present

## 2020-01-05 DIAGNOSIS — Z9181 History of falling: Secondary | ICD-10-CM | POA: Diagnosis not present

## 2020-01-06 DIAGNOSIS — E039 Hypothyroidism, unspecified: Secondary | ICD-10-CM | POA: Diagnosis not present

## 2020-01-06 DIAGNOSIS — I110 Hypertensive heart disease with heart failure: Secondary | ICD-10-CM | POA: Diagnosis not present

## 2020-01-06 DIAGNOSIS — K7460 Unspecified cirrhosis of liver: Secondary | ICD-10-CM | POA: Diagnosis not present

## 2020-01-06 DIAGNOSIS — K766 Portal hypertension: Secondary | ICD-10-CM | POA: Diagnosis not present

## 2020-01-06 DIAGNOSIS — I5030 Unspecified diastolic (congestive) heart failure: Secondary | ICD-10-CM | POA: Diagnosis not present

## 2020-01-06 DIAGNOSIS — M3500 Sicca syndrome, unspecified: Secondary | ICD-10-CM | POA: Diagnosis not present

## 2020-01-16 DIAGNOSIS — K7460 Unspecified cirrhosis of liver: Secondary | ICD-10-CM | POA: Diagnosis not present

## 2020-01-16 DIAGNOSIS — I110 Hypertensive heart disease with heart failure: Secondary | ICD-10-CM | POA: Diagnosis not present

## 2020-01-16 DIAGNOSIS — E039 Hypothyroidism, unspecified: Secondary | ICD-10-CM | POA: Diagnosis not present

## 2020-01-16 DIAGNOSIS — M3500 Sicca syndrome, unspecified: Secondary | ICD-10-CM | POA: Diagnosis not present

## 2020-01-16 DIAGNOSIS — I5030 Unspecified diastolic (congestive) heart failure: Secondary | ICD-10-CM | POA: Diagnosis not present

## 2020-01-16 DIAGNOSIS — K766 Portal hypertension: Secondary | ICD-10-CM | POA: Diagnosis not present

## 2020-01-21 ENCOUNTER — Other Ambulatory Visit: Payer: Self-pay

## 2020-01-21 ENCOUNTER — Ambulatory Visit (HOSPITAL_COMMUNITY)
Admission: RE | Admit: 2020-01-21 | Discharge: 2020-01-21 | Disposition: A | Discharge status: Home / Self Care | Payer: Medicare Other | Source: Ambulatory Visit | Attending: Family Medicine | Admitting: Family Medicine

## 2020-01-21 ENCOUNTER — Other Ambulatory Visit (HOSPITAL_COMMUNITY): Payer: Self-pay | Admitting: Family Medicine

## 2020-01-21 DIAGNOSIS — R0602 Shortness of breath: Secondary | ICD-10-CM | POA: Diagnosis not present

## 2020-01-21 DIAGNOSIS — J189 Pneumonia, unspecified organism: Secondary | ICD-10-CM

## 2020-01-21 DIAGNOSIS — E039 Hypothyroidism, unspecified: Secondary | ICD-10-CM | POA: Diagnosis not present

## 2020-01-22 DIAGNOSIS — M3500 Sicca syndrome, unspecified: Secondary | ICD-10-CM | POA: Diagnosis not present

## 2020-01-22 DIAGNOSIS — K7460 Unspecified cirrhosis of liver: Secondary | ICD-10-CM | POA: Diagnosis not present

## 2020-01-22 DIAGNOSIS — K766 Portal hypertension: Secondary | ICD-10-CM | POA: Diagnosis not present

## 2020-01-22 DIAGNOSIS — I110 Hypertensive heart disease with heart failure: Secondary | ICD-10-CM | POA: Diagnosis not present

## 2020-01-22 DIAGNOSIS — E039 Hypothyroidism, unspecified: Secondary | ICD-10-CM | POA: Diagnosis not present

## 2020-01-22 DIAGNOSIS — I5030 Unspecified diastolic (congestive) heart failure: Secondary | ICD-10-CM | POA: Diagnosis not present

## 2020-01-27 DIAGNOSIS — I5030 Unspecified diastolic (congestive) heart failure: Secondary | ICD-10-CM | POA: Diagnosis not present

## 2020-01-27 DIAGNOSIS — I110 Hypertensive heart disease with heart failure: Secondary | ICD-10-CM | POA: Diagnosis not present

## 2020-01-27 DIAGNOSIS — K766 Portal hypertension: Secondary | ICD-10-CM | POA: Diagnosis not present

## 2020-01-27 DIAGNOSIS — K7460 Unspecified cirrhosis of liver: Secondary | ICD-10-CM | POA: Diagnosis not present

## 2020-01-28 DIAGNOSIS — K766 Portal hypertension: Secondary | ICD-10-CM | POA: Diagnosis not present

## 2020-01-28 DIAGNOSIS — I5030 Unspecified diastolic (congestive) heart failure: Secondary | ICD-10-CM | POA: Diagnosis not present

## 2020-01-28 DIAGNOSIS — I110 Hypertensive heart disease with heart failure: Secondary | ICD-10-CM | POA: Diagnosis not present

## 2020-01-28 DIAGNOSIS — M3500 Sicca syndrome, unspecified: Secondary | ICD-10-CM | POA: Diagnosis not present

## 2020-01-28 DIAGNOSIS — K7460 Unspecified cirrhosis of liver: Secondary | ICD-10-CM | POA: Diagnosis not present

## 2020-01-28 DIAGNOSIS — E039 Hypothyroidism, unspecified: Secondary | ICD-10-CM | POA: Diagnosis not present

## 2020-02-03 DIAGNOSIS — K7460 Unspecified cirrhosis of liver: Secondary | ICD-10-CM | POA: Diagnosis not present

## 2020-02-03 DIAGNOSIS — K766 Portal hypertension: Secondary | ICD-10-CM | POA: Diagnosis not present

## 2020-02-03 DIAGNOSIS — M3500 Sicca syndrome, unspecified: Secondary | ICD-10-CM | POA: Diagnosis not present

## 2020-02-03 DIAGNOSIS — E039 Hypothyroidism, unspecified: Secondary | ICD-10-CM | POA: Diagnosis not present

## 2020-02-03 DIAGNOSIS — I5030 Unspecified diastolic (congestive) heart failure: Secondary | ICD-10-CM | POA: Diagnosis not present

## 2020-02-03 DIAGNOSIS — I110 Hypertensive heart disease with heart failure: Secondary | ICD-10-CM | POA: Diagnosis not present

## 2020-02-04 DIAGNOSIS — Z9181 History of falling: Secondary | ICD-10-CM | POA: Diagnosis not present

## 2020-02-04 DIAGNOSIS — K7460 Unspecified cirrhosis of liver: Secondary | ICD-10-CM | POA: Diagnosis not present

## 2020-02-04 DIAGNOSIS — E039 Hypothyroidism, unspecified: Secondary | ICD-10-CM | POA: Diagnosis not present

## 2020-02-04 DIAGNOSIS — K766 Portal hypertension: Secondary | ICD-10-CM | POA: Diagnosis not present

## 2020-02-04 DIAGNOSIS — I110 Hypertensive heart disease with heart failure: Secondary | ICD-10-CM | POA: Diagnosis not present

## 2020-02-04 DIAGNOSIS — I5030 Unspecified diastolic (congestive) heart failure: Secondary | ICD-10-CM | POA: Diagnosis not present

## 2020-02-04 DIAGNOSIS — M3500 Sicca syndrome, unspecified: Secondary | ICD-10-CM | POA: Diagnosis not present

## 2020-02-10 DIAGNOSIS — I110 Hypertensive heart disease with heart failure: Secondary | ICD-10-CM | POA: Diagnosis not present

## 2020-02-10 DIAGNOSIS — I5030 Unspecified diastolic (congestive) heart failure: Secondary | ICD-10-CM | POA: Diagnosis not present

## 2020-02-10 DIAGNOSIS — E039 Hypothyroidism, unspecified: Secondary | ICD-10-CM | POA: Diagnosis not present

## 2020-02-10 DIAGNOSIS — K766 Portal hypertension: Secondary | ICD-10-CM | POA: Diagnosis not present

## 2020-02-10 DIAGNOSIS — M3500 Sicca syndrome, unspecified: Secondary | ICD-10-CM | POA: Diagnosis not present

## 2020-02-10 DIAGNOSIS — K7460 Unspecified cirrhosis of liver: Secondary | ICD-10-CM | POA: Diagnosis not present

## 2020-02-14 DIAGNOSIS — E039 Hypothyroidism, unspecified: Secondary | ICD-10-CM | POA: Diagnosis not present

## 2020-02-14 DIAGNOSIS — I10 Essential (primary) hypertension: Secondary | ICD-10-CM | POA: Diagnosis not present

## 2020-02-14 DIAGNOSIS — E7849 Other hyperlipidemia: Secondary | ICD-10-CM | POA: Diagnosis not present

## 2020-02-25 DIAGNOSIS — K766 Portal hypertension: Secondary | ICD-10-CM | POA: Diagnosis not present

## 2020-02-25 DIAGNOSIS — E039 Hypothyroidism, unspecified: Secondary | ICD-10-CM | POA: Diagnosis not present

## 2020-02-25 DIAGNOSIS — K7460 Unspecified cirrhosis of liver: Secondary | ICD-10-CM | POA: Diagnosis not present

## 2020-02-25 DIAGNOSIS — I5030 Unspecified diastolic (congestive) heart failure: Secondary | ICD-10-CM | POA: Diagnosis not present

## 2020-02-25 DIAGNOSIS — M3500 Sicca syndrome, unspecified: Secondary | ICD-10-CM | POA: Diagnosis not present

## 2020-02-25 DIAGNOSIS — I110 Hypertensive heart disease with heart failure: Secondary | ICD-10-CM | POA: Diagnosis not present

## 2020-02-28 DIAGNOSIS — E039 Hypothyroidism, unspecified: Secondary | ICD-10-CM | POA: Diagnosis not present

## 2020-03-16 DIAGNOSIS — E7849 Other hyperlipidemia: Secondary | ICD-10-CM | POA: Diagnosis not present

## 2020-03-16 DIAGNOSIS — K219 Gastro-esophageal reflux disease without esophagitis: Secondary | ICD-10-CM | POA: Diagnosis not present

## 2020-03-16 DIAGNOSIS — E039 Hypothyroidism, unspecified: Secondary | ICD-10-CM | POA: Diagnosis not present

## 2020-03-16 DIAGNOSIS — I10 Essential (primary) hypertension: Secondary | ICD-10-CM | POA: Diagnosis not present

## 2020-03-21 ENCOUNTER — Other Ambulatory Visit (INDEPENDENT_AMBULATORY_CARE_PROVIDER_SITE_OTHER): Payer: Self-pay | Admitting: Internal Medicine

## 2020-03-23 NOTE — Telephone Encounter (Signed)
Refill sent to pharmacy. Last seen 6 months ago for cirrhosis - would like to see her in the office within next few months to assess dosing, etc. Please contact to schedule. Thanks.

## 2020-04-15 DIAGNOSIS — I10 Essential (primary) hypertension: Secondary | ICD-10-CM | POA: Diagnosis not present

## 2020-04-15 DIAGNOSIS — Z1389 Encounter for screening for other disorder: Secondary | ICD-10-CM | POA: Diagnosis not present

## 2020-04-15 DIAGNOSIS — E039 Hypothyroidism, unspecified: Secondary | ICD-10-CM | POA: Diagnosis not present

## 2020-04-15 DIAGNOSIS — K219 Gastro-esophageal reflux disease without esophagitis: Secondary | ICD-10-CM | POA: Diagnosis not present

## 2020-04-15 DIAGNOSIS — E559 Vitamin D deficiency, unspecified: Secondary | ICD-10-CM | POA: Diagnosis not present

## 2020-04-15 DIAGNOSIS — E7849 Other hyperlipidemia: Secondary | ICD-10-CM | POA: Diagnosis not present

## 2020-04-15 DIAGNOSIS — E538 Deficiency of other specified B group vitamins: Secondary | ICD-10-CM | POA: Diagnosis not present

## 2020-04-15 DIAGNOSIS — Z0000 Encounter for general adult medical examination without abnormal findings: Secondary | ICD-10-CM | POA: Diagnosis not present

## 2020-04-18 ENCOUNTER — Other Ambulatory Visit (INDEPENDENT_AMBULATORY_CARE_PROVIDER_SITE_OTHER): Payer: Self-pay | Admitting: Gastroenterology

## 2020-05-18 ENCOUNTER — Other Ambulatory Visit: Payer: Self-pay

## 2020-05-18 ENCOUNTER — Encounter (INDEPENDENT_AMBULATORY_CARE_PROVIDER_SITE_OTHER): Payer: Self-pay | Admitting: *Deleted

## 2020-05-18 ENCOUNTER — Encounter (INDEPENDENT_AMBULATORY_CARE_PROVIDER_SITE_OTHER): Payer: Self-pay | Admitting: Gastroenterology

## 2020-05-18 ENCOUNTER — Ambulatory Visit (INDEPENDENT_AMBULATORY_CARE_PROVIDER_SITE_OTHER): Payer: Medicare Other | Admitting: Gastroenterology

## 2020-05-18 VITALS — BP 158/70 | HR 46 | Resp 14 | Ht 59.0 in | Wt 88.0 lb

## 2020-05-18 DIAGNOSIS — K7460 Unspecified cirrhosis of liver: Secondary | ICD-10-CM

## 2020-05-18 DIAGNOSIS — R188 Other ascites: Secondary | ICD-10-CM | POA: Diagnosis not present

## 2020-05-18 NOTE — Progress Notes (Signed)
Patient profile: [name] is a 86 y.o. female seen for follow up.  History of Present Illness: [name] is seen today for follow up. Last seen 10/31/19 for cirrhosis, at that visited noted to be in CHF exacerbation, ultimately was sent to ER and admitted.    She is seen today and reports overall she is doing well. She is on nexium daily which controls GERD well. Denies nausea/vomiting. Does have some dysphagia in upper esophagea area (occurs w/ dry foods and pills)-having to regurgitate foods average once a month.   Continues to have loose stools alternating w/ hard stools. Moves stools daily up to 5-6x/day usually. She is on benefiber daily. Has been on imodium 4x/day in past. Reports chronic diarrhea for many years, currently taking imodium 3-4x/day. No blood in stool except one episode 6 weeks ago.    Only takes tylenol. Non smoker. No alcohol. Daughter accompanies and helps with history.   Wt Readings from Last 3 Encounters:  05/18/20 88 lb (39.9 kg)  11/05/19 89 lb 1.1 oz (40.4 kg)  10/31/19 132 lb 9.6 oz (60.1 kg)   Hospital d/c weight of #89lbs 11/05/19 "dry weight"  Last Colonoscopy: 08/2019-Perianal skin tags found on perianal exam. - Decreased sphincter tone found on digital rectal exam. - Two non-bleeding colonic angiodysplastic lesions. - Diverticulosis in the sigmoid colon.   Last Endoscopy: 08/2019-Normal proximal esophagus and mid esophagus. - Esophageal mucosal changes suspicious for short-segment Barrett's esophagus. Biopsy not performed. - Z-line regular, 34 cm from the incisors. - 4 cm hiatal hernia. - Portal hypertensive gastropathy. - Congested duodenal mucosa. - No specimens collected.   Past Medical History:  Past Medical History:  Diagnosis Date  . CHF (congestive heart failure) (HCC)   . Cirrhosis (HCC)   . GERD (gastroesophageal reflux disease)   . GI bleed   . Hypertension   . Sjogren's disease (HCC)   . Thyroid disease     hypothyroidism    Problem List: Patient Active Problem List   Diagnosis Date Noted  . Decompensated heart failure (HCC) 10/31/2019  . Acute on chronic anemia 09/17/2019  . Elevated lactic acid level 09/17/2019  . AKI (acute kidney injury) (HCC) 09/17/2019  . GI hemorrhage 09/09/2019  . Acute blood loss anemia 09/01/2019  . Lower GI bleed 09/01/2019  . Syncope 09/01/2019  . Hypothyroidism 09/01/2019  . Hypokalemia 09/01/2019  . Chronic diarrhea 09/01/2019  . Symptomatic anemia 08/27/2019  . Portal hypertension --- liver cirrhosis 08/27/2019  . Cirrhosis of liver with ascites and portal hypertension 08/27/2019  . Diarrhea 04/11/2019    Past Surgical History: Past Surgical History:  Procedure Laterality Date  . COLONOSCOPY WITH ESOPHAGOGASTRODUODENOSCOPY (EGD) AND ESOPHAGEAL DILATION (ED)     years ago  . COLONOSCOPY WITH PROPOFOL N/A 08/30/2019   Procedure: COLONOSCOPY WITH PROPOFOL;  Surgeon: Malissa Hippo, MD;  Location: [facility];  Service: Endoscopy;  Laterality: N/A;  . complete hysterectomy    . ESOPHAGOGASTRODUODENOSCOPY (EGD) WITH PROPOFOL N/A 08/30/2019   Procedure: ESOPHAGOGASTRODUODENOSCOPY (EGD) WITH PROPOFOL;  Surgeon: Malissa Hippo, MD;  Location: [facility];  Service: Endoscopy;  Laterality: N/A;  . left rotator cuff      Allergies: Allergies  Allergen Reactions  . Ciprofloxacin Other (See Comments)    NO REACTION LISTED  . Codeine     Unknown ( per daughter)  . Demerol     Unknown ( per daughter)  . Penicillins     Did it involve swelling of  the face/tongue/throat, SOB, or low BP? Unknown Did it involve sudden or severe rash/hives, skin peeling, or any reaction on the inside of your mouth or nose? Unknown Did you need to seek medical attention at a hospital or doctor's office? Yes When did it last happen?Uknown If all above answers are "NO", may proceed with cephalosporin use. No issue with ceftriaxone  . Sulfa Antibiotics     Unknown  ( per daughter)  . Trazodone And Nefazodone Other (See Comments)    Confusion and hallucinations      Home Medications:  Current Outpatient Medications:  .  Ascorbic Acid (VITAMIN C) 500 MG CAPS, Take by mouth daily., Disp: , Rfl:  .  Calcium-Magnesium-Zinc 333-133-5 MG TABS, Take 1 tablet by mouth every morning. , Disp: , Rfl:  .  Cholecalciferol (VITAMIN D3) 10 MCG (400 UNIT) CAPS, Take 1,000 mg by mouth daily. , Disp: , Rfl:  .  esomeprazole (NEXIUM) 20 MG capsule, Take 1 capsule (20 mg total) by mouth at bedtime., Disp: , Rfl:  .  Ferrous Sulfate (IRON) 325 (65 Fe) MG TABS, Take 1 tablet (325 mg total) by mouth 2 (two) times daily before lunch and supper., Disp: 30 tablet, Rfl: 0 .  furosemide (LASIX) 20 MG tablet, TAKE 1 TABLET BY MOUTH ONCE DAILY., Disp: 30 tablet, Rfl: 0 .  loperamide (IMODIUM) 2 MG capsule, Take 2 mg by mouth See admin instructions. 1 tablet in the morning and 2 tablets at bedtime, Disp: , Rfl:  .  sertraline (ZOLOFT) 25 MG tablet, Take 25 mg by mouth every morning. , Disp: , Rfl:  .  Simethicone (GAS-X PO), Take 2 tablets by mouth every evening. , Disp: , Rfl:  .  spironolactone (ALDACTONE) 50 MG tablet, TAKE 1 TABLET BY MOUTH ONCE DAILY., Disp: 30 tablet, Rfl: 0 .  thyroid (ARMOUR) 180 MG tablet, Take 180 mg by mouth daily before breakfast. taking 90 mg 1 tab daily, Disp: , Rfl:  .  vitamin B-12 (CYANOCOBALAMIN) 250 MCG tablet, Take 250 mcg by mouth every morning., Disp: , Rfl:  .  Wheat Dextrin (BENEFIBER DRINK MIX PO), Take 1 packet by mouth daily. With breakfast, Disp: , Rfl:    Family History: family history is not on file.    Social History:   reports that she has never smoked. She has never used smokeless tobacco. She reports that she does not drink alcohol and does not use drugs.   Review of Systems: Constitutional: Denies weight loss/weight gain  Eyes: No changes in vision. ENT: No oral lesions, sore throat.  GI: see HPI.  Heme/Lymph: No easy  bruising.  CV: No chest pain.  GU: No hematuria.  Integumentary: No rashes.  Neuro: No headaches.  Psych: No depression/anxiety.  Endocrine: No heat/cold intolerance.  Allergic/Immunologic: No urticaria.  Resp: No cough, SOB.  Musculoskeletal: No joint swelling.    Physical Examination: BP (!) 158/70   Pulse (!) 46   Resp 14   Ht 4\' 11"  (1.499 m)   Wt 88 lb (39.9 kg)   BMI 17.77 kg/m  Gen: NAD, alert and oriented x 4 HEENT: PEERLA, EOMI, Neck: supple, no JVD Chest: CTA bilaterally, no wheezes, crackles, or other adventitious sounds CV: RRR, no m/g/c/r Abd: soft, NT, ND, +BS in all four quadrants; no HSM, guarding, ridigity, or rebound tenderness Ext: no edema, well perfused with 2+ pulses, Skin: no rash or lesions noted on observed skin Lymph: no noted LAD  Data Reviewed:   11/07/19 labs -  Mag normal, BMP w/ Na 133, Cl 89, GFR 60, Ca 8.8, total protein 5.7, albumin 2.4  [place] abd complete 08/2019-IMPRESSION: 1. Cirrhosis of the liver. Ascites is identified compatible with portal venous hypertension. 2. Gallbladder wall thickening. In the setting of portal venous hypertension and ascites this may be a nonspecific finding. No gallstones or sonographic Murphy's sign noted. 3. Ascites, bilateral pleural effusions. 4.  Aortic Atherosclerosis (ICD10-I70.0).   Cirrhosis serologic w/up (in patient 08/2019) with negative ANA, negative celiac panel, normal immunoglobulin panel and AMA. APF normal. Immune to hep A&B  ECHO 08/2019--EF 50-55%-elevated left atrial pressure, grade 2 diastolic dysfunction  Assessment/Plan: Ms. Buffalo is a 86 y.o. female for follow-up of cirrhosis  1. Cirrhosis-diagnosed November 2020, felt related to cryptogenic/Sjogren's. She is due for AFP and routine imaging. Has had issues in the past with lower extremity edema and mild ascites, also likely has overlap with congestive heart failure. Currently no evidence on exam of ascites or edema with current  diuretic doses. We will recheck her electrolytes/kidney function to ensure stable.. Daughter does endorse some issues with memory recently and will check ammonia. No asterixis on exam. Overall daughter feels she is clinically doing fairly well.  2. Diarrhea-chronic for many years. She will continue using Imodium as needed. Reports this is unchanged recently.   Tylena was seen today for follow-up.  Diagnoses and all orders for this visit:  Cirrhosis of liver with ascites, unspecified hepatic cirrhosis type (HCC) -     CBC with Differential -     Protime-INR -     COMPLETE METABOLIC PANEL WITH GFR -     AFP tumor marker -     Ammonia -     US Abdomen Complete; Future  Other orders -     Ammonia     Follow-up in 6 months if labs stable. Contact us sooner with any issues.   I personally performed the service, non-incident to. (WP)  Tawni Pummel, [facility]

## 2020-05-18 NOTE — Patient Instructions (Signed)
We are checking labs today for evaluation and scheduling [place]

## 2020-05-19 LAB — COMPLETE METABOLIC PANEL WITH GFR
AG Ratio: 1.1 (calc) (ref 1.0–2.5)
ALT: 8 U/L (ref 6–29)
AST: 19 U/L (ref 10–35)
Albumin: 4.1 g/dL (ref 3.6–5.1)
Alkaline phosphatase (APISO): 87 U/L (ref 37–153)
BUN: 17 mg/dL (ref 7–25)
CO2: 30 mmol/L (ref 20–32)
Calcium: 10.2 mg/dL (ref 8.6–10.4)
Chloride: 97 mmol/L — ABNORMAL LOW (ref 98–110)
Creat: 0.82 mg/dL (ref 0.60–0.88)
GFR, Est African American: 76 mL/min/{1.73_m2} (ref >=60)
GFR, Est Non African American: 65 mL/min/{1.73_m2} (ref >=60)
Globulin: 3.7 g/dL (calc) (ref 1.9–3.7)
Glucose, Bld: 87 mg/dL (ref 65–139)
Potassium: 4.2 mmol/L (ref 3.5–5.3)
Sodium: 136 mmol/L (ref 135–146)
Total Bilirubin: 0.7 mg/dL (ref 0.2–1.2)
Total Protein: 7.8 g/dL (ref 6.1–8.1)

## 2020-05-19 LAB — CBC WITH DIFFERENTIAL/PLATELET
Absolute Monocytes: 956 cells/uL — ABNORMAL HIGH (ref 200–950)
Basophils Absolute: 29 cells/uL (ref 0–200)
Basophils Relative: 0.4 %
Eosinophils Absolute: 234 cells/uL (ref 15–500)
Eosinophils Relative: 3.2 %
HCT: 41.7 % (ref 35.0–45.0)
Hemoglobin: 13.6 g/dL (ref 11.7–15.5)
Lymphs Abs: 986 cells/uL (ref 850–3900)
MCH: 29.1 pg (ref 27.0–33.0)
MCHC: 32.6 g/dL (ref 32.0–36.0)
MCV: 89.1 fL (ref 80.0–100.0)
MPV: 10.7 fL (ref 7.5–12.5)
Monocytes Relative: 13.1 %
Neutro Abs: 5095 cells/uL (ref 1500–7800)
Neutrophils Relative %: 69.8 %
Platelets: 194 10*3/uL (ref 140–400)
RBC: 4.68 10*6/uL (ref 3.80–5.10)
RDW: 12.7 % (ref 11.0–15.0)
Total Lymphocyte: 13.5 %
WBC: 7.3 10*3/uL (ref 3.8–10.8)

## 2020-05-19 LAB — AMMONIA: Ammonia: 27 umol/L (ref <=72)

## 2020-05-19 LAB — PROTIME-INR
INR: 1
Prothrombin Time: 11.1 s (ref 9.0–11.5)

## 2020-05-19 LAB — AFP TUMOR MARKER: AFP-Tumor Marker: 1.1 ng/mL

## 2020-05-20 ENCOUNTER — Other Ambulatory Visit (INDEPENDENT_AMBULATORY_CARE_PROVIDER_SITE_OTHER): Payer: Self-pay | Admitting: Gastroenterology

## 2020-05-26 ENCOUNTER — Ambulatory Visit (HOSPITAL_COMMUNITY)
Admission: RE | Admit: 2020-05-26 | Discharge: 2020-05-26 | Disposition: A | Discharge status: Home / Self Care | Payer: Medicare Other | Source: Ambulatory Visit | Attending: Gastroenterology | Admitting: Gastroenterology

## 2020-05-26 ENCOUNTER — Other Ambulatory Visit: Payer: Self-pay

## 2020-05-26 DIAGNOSIS — K7460 Unspecified cirrhosis of liver: Secondary | ICD-10-CM | POA: Insufficient documentation

## 2020-05-26 DIAGNOSIS — R188 Other ascites: Secondary | ICD-10-CM | POA: Diagnosis not present

## 2020-05-26 DIAGNOSIS — N1330 Unspecified hydronephrosis: Secondary | ICD-10-CM | POA: Diagnosis not present

## 2020-06-03 DIAGNOSIS — E039 Hypothyroidism, unspecified: Secondary | ICD-10-CM | POA: Diagnosis not present

## 2020-06-16 DIAGNOSIS — E7849 Other hyperlipidemia: Secondary | ICD-10-CM | POA: Diagnosis not present

## 2020-06-16 DIAGNOSIS — I10 Essential (primary) hypertension: Secondary | ICD-10-CM | POA: Diagnosis not present

## 2020-06-16 DIAGNOSIS — K219 Gastro-esophageal reflux disease without esophagitis: Secondary | ICD-10-CM | POA: Diagnosis not present

## 2020-06-16 DIAGNOSIS — E039 Hypothyroidism, unspecified: Secondary | ICD-10-CM | POA: Diagnosis not present

## 2020-06-18 ENCOUNTER — Other Ambulatory Visit (INDEPENDENT_AMBULATORY_CARE_PROVIDER_SITE_OTHER): Payer: Self-pay | Admitting: Gastroenterology

## 2020-07-01 DIAGNOSIS — H25813 Combined forms of age-related cataract, bilateral: Secondary | ICD-10-CM | POA: Diagnosis not present

## 2020-07-15 DIAGNOSIS — E039 Hypothyroidism, unspecified: Secondary | ICD-10-CM | POA: Diagnosis not present

## 2020-07-20 ENCOUNTER — Other Ambulatory Visit (INDEPENDENT_AMBULATORY_CARE_PROVIDER_SITE_OTHER): Payer: Self-pay | Admitting: Gastroenterology

## 2020-08-15 DIAGNOSIS — E039 Hypothyroidism, unspecified: Secondary | ICD-10-CM | POA: Diagnosis not present

## 2020-08-15 DIAGNOSIS — I10 Essential (primary) hypertension: Secondary | ICD-10-CM | POA: Diagnosis not present

## 2020-08-15 DIAGNOSIS — E7849 Other hyperlipidemia: Secondary | ICD-10-CM | POA: Diagnosis not present

## 2020-08-17 ENCOUNTER — Other Ambulatory Visit (INDEPENDENT_AMBULATORY_CARE_PROVIDER_SITE_OTHER): Payer: Self-pay | Admitting: Gastroenterology

## 2020-08-18 NOTE — Telephone Encounter (Signed)
Last seen 05/18/2020 for Cirrhosis by Liborio Nixon.

## 2020-09-16 ENCOUNTER — Other Ambulatory Visit (INDEPENDENT_AMBULATORY_CARE_PROVIDER_SITE_OTHER): Payer: Self-pay

## 2020-09-16 DIAGNOSIS — E039 Hypothyroidism, unspecified: Secondary | ICD-10-CM | POA: Diagnosis not present

## 2020-09-16 MED ORDER — FUROSEMIDE 20 MG PO TABS: 20.0000 mg | ORAL_TABLET | Freq: Every day | ORAL | 5 refills | Status: AC

## 2020-09-16 MED ORDER — SPIRONOLACTONE 50 MG PO TABS: 50.0000 mg | ORAL_TABLET | Freq: Every day | ORAL | 5 refills | Status: AC

## 2020-09-21 DIAGNOSIS — H25812 Combined forms of age-related cataract, left eye: Secondary | ICD-10-CM | POA: Diagnosis not present

## 2020-09-21 DIAGNOSIS — H01004 Unspecified blepharitis left upper eyelid: Secondary | ICD-10-CM | POA: Diagnosis not present

## 2020-09-21 DIAGNOSIS — H01005 Unspecified blepharitis left lower eyelid: Secondary | ICD-10-CM | POA: Diagnosis not present

## 2020-09-21 DIAGNOSIS — H01001 Unspecified blepharitis right upper eyelid: Secondary | ICD-10-CM | POA: Diagnosis not present

## 2020-09-21 DIAGNOSIS — H01002 Unspecified blepharitis right lower eyelid: Secondary | ICD-10-CM | POA: Diagnosis not present

## 2020-09-21 DIAGNOSIS — H25813 Combined forms of age-related cataract, bilateral: Secondary | ICD-10-CM | POA: Diagnosis not present

## 2020-09-21 DIAGNOSIS — M3501 Sicca syndrome with keratoconjunctivitis: Secondary | ICD-10-CM | POA: Diagnosis not present

## 2020-09-22 NOTE — Patient Instructions (Signed)
Your procedure is scheduled on:  09/28/2020               Report to [facility] at   9:30  AM.                Call this number if you have problems the morning of surgery: [phone]   Do not eat or drink :After Midnight.    Take these medicines the morning of surgery with A SIP OF WATER:  Nexium, Zoloft, thyroid and aldactone         Do not wear jewelry, make-up or nail polish.  Do not wear lotions, powders, or perfumes. You may wear deodorant.  Do not bring valuables to the hospital.  Contacts, dentures or bridgework may not be worn into surgery.  Patients discharged the day of surgery will not be allowed to drive home.  Name and phone number of your driver.                                                                                                                                       Cataract Surgery  A cataract is a clouding of the lens of the eye. When a lens becomes cloudy, vision is reduced based on the degree and nature of the clouding. Surgery may be needed to improve vision. Surgery removes the cloudy lens and usually replaces it with a substitute lens (intraocular lens, IOL). LET YOUR EYE DOCTOR KNOW ABOUT:  Allergies to food or medicine.   Medicines taken including herbs, eyedrops, over-the-counter medicines, and creams.   Use of steroids (by mouth or creams).   Previous problems with anesthetics or numbing medicine.   History of bleeding problems or blood clots.   Previous surgery.   Other health problems, including diabetes and kidney problems.   Possibility of pregnancy, if this applies.  RISKS AND COMPLICATIONS  Infection.   Inflammation of the eyeball (endophthalmitis) that can spread to both eyes (sympathetic ophthalmia).   Poor wound healing.   If an IOL is inserted, it can later fall out of proper position. This is very uncommon.   Clouding of the part of your eye that holds an IOL in place. This is called an "after-cataract." These are uncommon,  but easily treated.  BEFORE THE PROCEDURE  Do not eat or drink anything except small amounts of water for 8 to 12 before your surgery, or as directed by your caregiver.    Unless you are told otherwise, continue any eyedrops you have been prescribed.   Talk to your primary caregiver about all other medicines that you take (both prescription and non-prescription). In some cases, you may need to stop or change medicines near the time of your surgery. This is most important if you are taking blood-thinning medicine. Do not stop medicines unless you are told to do so.   Arrange for someone to drive you to and from  the procedure.   Do not put contact lenses in either eye on the day of your surgery.  PROCEDURE There is more than one method for safely removing a cataract. Your doctor can explain the differences and help determine which is best for you. Phacoemulsification surgery is the most common form of cataract surgery.  An injection is given behind the eye or eyedrops are given to make this a painless procedure.   A small cut (incision) is made on the edge of the clear, dome-shaped surface that covers the front of the eye (cornea).   A tiny probe is painlessly inserted into the eye. This device gives off ultrasound waves that soften and break up the cloudy center of the lens. This makes it easier for the cloudy lens to be removed by suction.   An IOL may be implanted.   The normal lens of the eye is covered by a clear capsule. Part of that capsule is intentionally left in the eye to support the IOL.   Your surgeon may or may not use stitches to close the incision.  There are other forms of cataract surgery that require a larger incision and stiches to close the eye. This approach is taken in cases where the doctor feels that the cataract cannot be easily removed using phacoemulsification. AFTER THE PROCEDURE  When an IOL is implanted, it does not need care. It becomes a permanent part of  your eye and cannot be seen or felt.   Your doctor will schedule follow-up exams to check on your progress.   Review your other medicines with your doctor to see which can be resumed after surgery.   Use eyedrops or take medicine as prescribed by your doctor.  Document Released: 09/22/2011 Document Reviewed: 09/19/2011 [facility] Patient Information 2012 [place], [place].  .Cataract Surgery Care After Refer to this sheet in the next few weeks. These instructions provide you with information on caring for yourself after your procedure. Your caregiver may also give you more specific instructions. Your treatment has been planned according to current medical practices, but problems sometimes occur. Call your caregiver if you have any problems or questions after your procedure.  HOME CARE INSTRUCTIONS   Avoid strenuous activities as directed by your caregiver.   Ask your caregiver when you can resume driving.   Use eyedrops or other medicines to help healing and control pressure inside your eye as directed by your caregiver.   Only take over-the-counter or prescription medicines for pain, discomfort, or fever as directed by your caregiver.   Do not to touch or rub your eyes.   You may be instructed to use a protective shield during the first few days and nights after surgery. If not, wear sunglasses to protect your eyes. This is to protect the eye from pressure or from being accidentally bumped.   Keep the area around your eye clean and dry. Avoid swimming or allowing water to hit you directly in the face while showering. Keep soap and shampoo out of your eyes.   Do not bend or lift heavy objects. Bending increases pressure in the eye. You can walk, climb stairs, and do light household chores.   Do not put a contact lens into the eye that had surgery until your caregiver says it is okay to do so.   Ask your doctor when you can return to work. This will depend on the kind of work that you do.  If you work in a dusty environment, you may  be advised to wear protective eyewear for a period of time.   Ask your caregiver when it will be safe to engage in sexual activity.   Continue with your regular eye exams as directed by your caregiver.  What to expect:  It is normal to feel itching and mild discomfort for a few days after cataract surgery. Some fluid discharge is also common, and your eye may be sensitive to light and touch.   After 1 to 2 days, even moderate discomfort should disappear. In most cases, healing will take about 6 weeks.   If you received an intraocular lens (IOL), you may notice that colors are very bright or have a blue tinge. Also, if you have been in bright sunlight, everything may appear reddish for a few hours. If you see these color tinges, it is because your lens is clear and no longer cloudy. Within a few months after receiving an IOL, these extra colors should go away. When you have healed, you will probably need new glasses.  SEEK MEDICAL CARE IF:   You have increased bruising around your eye.   You have discomfort not helped by medicine.  SEEK IMMEDIATE MEDICAL CARE IF:   You have a  fever.   You have a worsening or sudden vision loss.   You have redness, swelling, or increasing pain in the eye.   You have a thick discharge from the eye that had surgery.  MAKE SURE YOU:  Understand these instructions.   Will watch your condition.   Will get help right away if you are not doing well or get worse.  Document Released: 04/22/2005 Document Revised: 09/22/2011 Document Reviewed: 05/27/2011 [facility] Patient Information 2012 [place].    Monitored Anesthesia Care  Monitored anesthesia care is an anesthesia service for a medical procedure. Anesthesia is the loss of the ability to feel pain. It is produced by medications called anesthetics. It may affect a small area of your body (local anesthesia), a large area of your body (regional  anesthesia), or your entire body (general anesthesia). The need for monitored anesthesia care depends your procedure, your condition, and the potential need for regional or general anesthesia. It is often provided during procedures where:   General anesthesia may be needed if there are complications. This is because you need special care when you are under general anesthesia.    You will be under local or regional anesthesia. This is so that you are able to have higher levels of anesthesia if needed.    You will receive calming medications (sedatives). This is especially the case if sedatives are given to put you in a semi-conscious state of relaxation (deep sedation). This is because the amount of sedative needed to produce this state can be hard to predict. Too much of a sedative can produce general anesthesia. Monitored anesthesia care is performed by one or more caregivers who have special training in all types of anesthesia. You will need to meet with these caregivers before your procedure. During this meeting, they will ask you about your medical history. They will also give you instructions to follow. (For example, you will need to stop eating and drinking before your procedure. You may also need to stop or change medications you are taking.) During your procedure, your caregivers will stay with you. They will:   Watch your condition. This includes watching you blood pressure, breathing, and level of pain.    Diagnose and treat problems that occur.    Give  medications if they are needed. These may include calming medications (sedatives) and anesthetics.    Make sure you are comfortable.   Having monitored anesthesia care does not necessarily mean that you will be under anesthesia. It does mean that your caregivers will be able to manage anesthesia if you need it or if it occurs. It also means that you will be able to have a different type of anesthesia than you are having if you need it. When  your procedure is complete, your caregivers will continue to watch your condition. They will make sure any medications wear off before you are allowed to go home.  Document Released: 06/29/2005 Document Revised: 01/28/2013 Document Reviewed: 11/14/2012 [facility] Patient Information 2014 [place], [place].

## 2020-09-22 NOTE — H&P (Signed)
Surgical History & Physical  Patient Name: [name] DOB: [birth year]  Surgery: Cataract extraction with intraocular lens implant phacoemulsification; Left Eye  Surgeon: Fabio Pierce MD Surgery Date:  09/28/2020 Pre-Op Date:  09/21/2020  HPI: [name] patient Pt referred by Dr. Charise Killian for cataract evaluation. The patient complains of nighttime light - car headlights, street lamps etc. glare causing poor vision, which began many years ago. Both eyes are affected. The episode is constant. The condition's severity is worsening. Pt states she had some severe heart issues early this year and states cataract worsened after hospital stay. Was in the hospital for months. Pt states symptoms are negatively affecting pt's quality of life. Pt has bothersome dry eye. Using Refresh, genteal and rx drops for relief. Pt denies any increase in floaters or flashes of light. HPI was performed by Fabio Pierce .  Medical History: Dry Eyes Cataracts Heart Problem High Blood Pressure Sjogren's Syndrome Raynaud's Disease Acid Refl... Thyroid Problems  Review of Systems Negative Allergic/Immunologic Negative Cardiovascular Negative Constitutional Negative Ear, Nose, Mouth & Throat Negative Endocrine Negative Eyes Negative Gastrointestinal Negative Genitourinary Negative Hemotologic/Lymphatic Negative Integumentary Negative Musculoskeletal Negative Neurological Negative Psychiatry Negative Respiratory  Social   Never smoked  Medication Genteal, Refresh omega 3,  Armour thyroid, Fish Oil, HCTZ, Headache meds, Nexium, Lasix, Zoloft,   Sx/Procedures Punctal Plugs,   Drug Allergies  Dust, Seasonal allergies, Cipro, Codeine,   History & Physical: Heent:  Cataract, Left eye NECK: supple without bruits LUNGS: lungs clear to auscultation CV: regular rate and rhythm Abdomen: soft and non-tender  Impression & Plan: Assessment: 1.  COMBINED FORMS AGE RELATED CATARACT; Both Eyes  (H25.813) 2.  SICCA SYNDROME WITH KERATOCONUNCTIVITIS (M35.01) 3.  BLEPHARITIS; Right Upper Lid, Right Lower Lid, Left Upper Lid, Left Lower Lid (H01.001, H01.002,H01.004,H01.005)  Plan: 1.  Cataract accounts for the patient's decreased vision. This visual impairment is not correctable with a tolerable change in glasses or contact lenses. Cataract surgery with an implantation of a new lens should significantly improve the visual and functional status of the patient. Discussed all risks, benefits, alternatives, and potential complications. Discussed the procedures and recovery. Patient desires to have surgery. A-scan ordered and performed today for intra-ocular lens calculations. The surgery will be performed in order to improve vision for driving, reading, and for eye examinations. Recommend phacoemulsification with intra-ocular lens. Recommend Dextenza for post-operative pain and inflammation. Left Eye. Dilates well - shugarcaine by protocol. 2.  S/P plugs. Continue tears. FML did improve symptoms, can renew as needed. 3.  Asymptomatic. Stable.

## 2020-09-24 ENCOUNTER — Encounter (HOSPITAL_COMMUNITY)
Admission: RE | Admit: 2020-09-24 | Discharge: 2020-09-24 | Disposition: A | Discharge status: Home / Self Care | Payer: Medicare Other | Source: Ambulatory Visit | Attending: Ophthalmology | Admitting: Ophthalmology

## 2020-09-24 ENCOUNTER — Other Ambulatory Visit: Payer: Self-pay

## 2020-09-25 ENCOUNTER — Other Ambulatory Visit (HOSPITAL_COMMUNITY)
Admission: RE | Admit: 2020-09-25 | Discharge: 2020-09-25 | Disposition: A | Discharge status: Home / Self Care | Payer: Medicare Other | Source: Ambulatory Visit | Attending: Ophthalmology | Admitting: Ophthalmology

## 2020-09-25 ENCOUNTER — Other Ambulatory Visit: Payer: Self-pay

## 2020-09-25 DIAGNOSIS — Z20822 Contact with and (suspected) exposure to covid-19: Secondary | ICD-10-CM | POA: Insufficient documentation

## 2020-09-25 DIAGNOSIS — Z01818 Encounter for other preprocedural examination: Secondary | ICD-10-CM | POA: Insufficient documentation

## 2020-09-25 DIAGNOSIS — Z01812 Encounter for preprocedural laboratory examination: Secondary | ICD-10-CM | POA: Diagnosis not present

## 2020-09-25 LAB — BASIC METABOLIC PANEL
Anion gap: 11 (ref 5–15)
BUN: 17 mg/dL (ref 8–23)
CO2: 26 mmol/L (ref 22–32)
Calcium: 9.2 mg/dL (ref 8.9–10.3)
Chloride: 98 mmol/L (ref 98–111)
Creatinine, Ser: 0.74 mg/dL (ref 0.44–1.00)
GFR, Estimated: >60 mL/min (ref >60)
Glucose, Bld: 97 mg/dL (ref 70–99)
Potassium: 3.5 mmol/L (ref 3.5–5.1)
Sodium: 135 mmol/L (ref 135–145)

## 2020-09-25 LAB — SARS CORONAVIRUS 2 (TAT 6-24 HRS): SARS Coronavirus 2: NEGATIVE

## 2020-09-28 ENCOUNTER — Ambulatory Visit (HOSPITAL_COMMUNITY): Payer: Medicare Other

## 2020-09-28 ENCOUNTER — Encounter (HOSPITAL_COMMUNITY)
Admission: RE | Disposition: A | Discharge status: Home / Self Care | Payer: Self-pay | Source: Home / Self Care | Attending: Ophthalmology

## 2020-09-28 ENCOUNTER — Encounter (HOSPITAL_COMMUNITY): Payer: Self-pay | Admitting: Ophthalmology

## 2020-09-28 ENCOUNTER — Ambulatory Visit (HOSPITAL_COMMUNITY)
Admission: RE | Admit: 2020-09-28 | Discharge: 2020-09-28 | Disposition: A | Discharge status: Home / Self Care | Payer: Medicare Other | Attending: Ophthalmology | Admitting: Ophthalmology

## 2020-09-28 DIAGNOSIS — Z79899 Other long term (current) drug therapy: Secondary | ICD-10-CM | POA: Insufficient documentation

## 2020-09-28 DIAGNOSIS — Z79890 Hormone replacement therapy (postmenopausal): Secondary | ICD-10-CM | POA: Insufficient documentation

## 2020-09-28 DIAGNOSIS — H5712 Ocular pain, left eye: Secondary | ICD-10-CM | POA: Diagnosis not present

## 2020-09-28 DIAGNOSIS — H25812 Combined forms of age-related cataract, left eye: Secondary | ICD-10-CM | POA: Insufficient documentation

## 2020-09-28 DIAGNOSIS — M3501 Sicca syndrome with keratoconjunctivitis: Secondary | ICD-10-CM | POA: Insufficient documentation

## 2020-09-28 SURGERY — CATARACT EXTRACTION PHACO AND INTRAOCULAR LENS PLACEMENT (IOC) with placement of Corticosteroid
Anesthesia: Monitor Anesthesia Care | Site: Eye | Laterality: Left

## 2020-09-28 MED ORDER — PHENYLEPHRINE HCL 2.5 % OP SOLN
1.0000 [drp] | OPHTHALMIC | Status: AC | PRN
  Administered 2020-09-28 (×3): 1 [drp] via OPHTHALMIC

## 2020-09-28 MED ORDER — PROVISC 10 MG/ML IO SOLN
INTRAOCULAR | Status: DC | PRN
  Administered 2020-09-28: 0.85 mL via INTRAOCULAR

## 2020-09-28 MED ORDER — LIDOCAINE HCL 3.5 % OP GEL
1.0000 "application " | Freq: Once | OPHTHALMIC | Status: AC
  Administered 2020-09-28: 1 via OPHTHALMIC

## 2020-09-28 MED ORDER — LIDOCAINE HCL (PF) 1 % IJ SOLN
INTRAOCULAR | Status: DC | PRN
  Administered 2020-09-28: 11:00:00 1 mL via OPHTHALMIC

## 2020-09-28 MED ORDER — CYCLOPENTOLATE-PHENYLEPHRINE 0.2-1 % OP SOLN
1.0000 [drp] | OPHTHALMIC | Status: AC | PRN
  Administered 2020-09-28 (×3): 1 [drp] via OPHTHALMIC

## 2020-09-28 MED ORDER — MIDAZOLAM HCL 2 MG/2ML IJ SOLN
INTRAMUSCULAR | Status: AC
  Filled 2020-09-28: qty 2

## 2020-09-28 MED ORDER — EPINEPHRINE PF 1 MG/ML IJ SOLN
INTRAMUSCULAR | Status: AC
  Filled 2020-09-28: qty 1

## 2020-09-28 MED ORDER — DEXAMETHASONE 0.4 MG OP INST
VAGINAL_INSERT | OPHTHALMIC | Status: DC | PRN
  Administered 2020-09-28: 0.4 mg via OPHTHALMIC

## 2020-09-28 MED ORDER — STERILE WATER FOR IRRIGATION IR SOLN
Status: DC | PRN
  Administered 2020-09-28: 250 mL

## 2020-09-28 MED ORDER — TETRACAINE HCL 0.5 % OP SOLN
1.0000 [drp] | OPHTHALMIC | Status: AC | PRN
  Administered 2020-09-28 (×3): 1 [drp] via OPHTHALMIC

## 2020-09-28 MED ORDER — EPINEPHRINE PF 1 MG/ML IJ SOLN
INTRAOCULAR | Status: DC | PRN
  Administered 2020-09-28: 11:00:00 500 mL

## 2020-09-28 MED ORDER — POVIDONE-IODINE 5 % OP SOLN
OPHTHALMIC | Status: DC | PRN
  Administered 2020-09-28: 1 via OPHTHALMIC

## 2020-09-28 MED ORDER — SODIUM HYALURONATE 23 MG/ML IO SOLN
INTRAOCULAR | Status: DC | PRN
  Administered 2020-09-28: 0.6 mL via INTRAOCULAR

## 2020-09-28 MED ORDER — DEXAMETHASONE 0.4 MG OP INST
VAGINAL_INSERT | OPHTHALMIC | Status: AC
  Filled 2020-09-28: qty 1

## 2020-09-28 MED ORDER — BSS IO SOLN
INTRAOCULAR | Status: DC | PRN
  Administered 2020-09-28: 15 mL via INTRAOCULAR

## 2020-09-28 MED ORDER — MIDAZOLAM HCL 2 MG/2ML IJ SOLN
INTRAMUSCULAR | Status: DC | PRN
  Administered 2020-09-28: .5 mg via INTRAVENOUS

## 2020-09-28 SURGICAL SUPPLY — 18 items
CLOTH BEACON ORANGE TIMEOUT ST (SAFETY) ×3 IMPLANT
DEVICE MILOOP (MISCELLANEOUS) IMPLANT
EYE SHIELD UNIVERSAL CLEAR (GAUZE/BANDAGES/DRESSINGS) ×3 IMPLANT
GLOVE BIOGEL PI IND STRL 7.0 (GLOVE) ×3 IMPLANT
GLOVE BIOGEL PI INDICATOR 7.0 (GLOVE) ×6
GOWN STRL REUS W/TWL LRG LVL3 (GOWN DISPOSABLE) ×3 IMPLANT
MILOOP DEVICE (MISCELLANEOUS)
NDL HYPO 18GX1.5 BLUNT FILL (NEEDLE) IMPLANT
NEEDLE HYPO 18GX1.5 BLUNT FILL (NEEDLE) ×4 IMPLANT
PAD ARMBOARD 7.5X6 YLW CONV (MISCELLANEOUS) ×3 IMPLANT
RING MALYGIN (MISCELLANEOUS) IMPLANT
RING MALYGIN 7.0 (MISCELLANEOUS) IMPLANT
SYR TB 1ML LL NO SAFETY (SYRINGE) ×3 IMPLANT
TAPE SURG TRANSPORE 1 IN (GAUZE/BANDAGES/DRESSINGS) ×1 IMPLANT
TAPE SURGICAL TRANSPORE 1 IN (GAUZE/BANDAGES/DRESSINGS) ×4
Tecnis 1 Piece Intraocular Lens (Intraocular Lens) ×3 IMPLANT
VISCOELASTIC ADDITIONAL (OPHTHALMIC RELATED) IMPLANT
WATER STERILE IRR 250ML POUR (IV SOLUTION) ×3 IMPLANT

## 2020-09-28 NOTE — Transfer of Care (Signed)
Immediate Anesthesia Transfer of Care Note  Patient: [name]  Procedure(s) Performed: CATARACT EXTRACTION PHACO AND INTRAOCULAR LENS PLACEMENT (IOC) with placement of Corticosteroid (Left Eye)  Patient Location: PACU  Anesthesia Type:MAC  Level of Consciousness: awake, alert  and oriented  Airway & Oxygen Therapy: Patient Spontanous Breathing  Post-op Assessment: Report given to RN and Post -op Vital signs reviewed and stable  Post vital signs: Reviewed and stable  Last Vitals:  Vitals Value Taken Time  BP    Temp    Pulse    Resp    SpO2      Last Pain: There were no vitals filed for this visit.       Complications: No complications documented.

## 2020-09-28 NOTE — Anesthesia Postprocedure Evaluation (Signed)
Anesthesia Post Note  Patient: [name]  Procedure(s) Performed: CATARACT EXTRACTION PHACO AND INTRAOCULAR LENS PLACEMENT (IOC) with placement of Corticosteroid (Left Eye)  Patient location during evaluation: Phase II Anesthesia Type: MAC Level of consciousness: awake and oriented Pain management: pain level controlled Vital Signs Assessment: post-procedure vital signs reviewed and stable Respiratory status: spontaneous breathing and respiratory function stable Cardiovascular status: blood pressure returned to baseline and stable Postop Assessment: adequate PO intake and no apparent nausea or vomiting Anesthetic complications: no   No complications documented.   Last Vitals: There were no vitals filed for this visit.  Last Pain: There were no vitals filed for this visit.               Karna Dupes

## 2020-09-28 NOTE — Op Note (Signed)
Date of procedure: 09/28/20  Pre-operative diagnosis: Visually significant age-related combined cataract, Left Eye (H25.812)  Post-operative diagnosis:  1. Visually significant age-related combined cataract, Left Eye (H25.812) 2.   Pain and inflammation following cataract surgery, Left Eye (H57.12)  Procedure:  1. Removal of cataract via phacoemulsification and insertion of intra-ocular lens Johnson and Medley  +20.0D into the capsular bag of the Left Eye 2. Placement of Dextenza Implant, Left Lower Lid  Attending surgeon: Gerda Diss. Shataya Winkles, MD, MA  Anesthesia: MAC, Topical Akten  Complications: None  Estimated Blood Loss: <9m (minimal)  Specimens: None  Implants: As above  Indications:  Visually significant age-related cataract, Left Eye  Procedure:  The patient was seen and identified in the pre-operative area. The operative eye was identified and dilated.  The operative eye was marked.  Topical anesthesia was administered to the operative eye.     The patient was then to the operative suite and placed in the supine position.  A timeout was performed confirming the patient, procedure to be performed, and all other relevant information.   The patient's face was prepped and draped in the usual fashion for intra-ocular surgery.  A lid speculum was placed into the operative eye and the surgical microscope moved into place and focused.  An inferotemporal paracentesis was created using a 20 gauge paracentesis blade.  Shugarcaine was injected into the anterior chamber.  Viscoelastic was injected into the anterior chamber.  A temporal clear-corneal main wound incision was created using a 2.459mmicrokeratome.  A continuous curvilinear capsulorrhexis was initiated using an irrigating cystitome and completed using capsulorrhexis forceps.  Hydrodissection and hydrodeliniation were performed.  Viscoelastic was injected into the anterior chamber.  A phacoemulsification handpiece and a  chopper as a second instrument were used to remove the nucleus and epinucleus. The irrigation/aspiration handpiece was used to remove any remaining cortical material.   The capsular bag was reinflated with viscoelastic, checked, and found to be intact.  The intraocular lens was inserted into the capsular bag.  The irrigation/aspiration handpiece was used to remove any remaining viscoelastic.  The clear corneal wound and paracentesis wounds were then hydrated and checked with Weck-Cels to be watertight.  The lid-speculum was removed.    A Dextenza implant was placed in the lower canaliculus.   The drape was removed.  The patient's face was cleaned with a wet and dry 4x4.   A clear shield was taped over the eye. The patient was taken to the post-operative care unit in good condition, having tolerated the procedure well.  Post-Op Instructions: [facility] and will receive all other orders and instructions.

## 2020-09-28 NOTE — Interval H&P Note (Signed)
History and Physical Interval Note:  09/28/2020 10:58 AM  [name]  has presented today for surgery, with the diagnosis of Nuclear sclerotic cataract - Left eye.  The various methods of treatment have been discussed with the patient and family. After consideration of risks, benefits and other options for treatment, the patient has consented to  Procedure(s) with comments: CATARACT EXTRACTION PHACO AND INTRAOCULAR LENS PLACEMENT (IOC) (Left) - left as a surgical intervention.  The patient's history has been reviewed, patient examined, no change in status, stable for surgery.  I have reviewed the patient's chart and labs.  Questions were answered to the patient's satisfaction.     Fabio Pierce

## 2020-09-28 NOTE — Discharge Instructions (Addendum)
Please discharge patient when stable, will follow up today with Dr. Wrzosek at the [facility] [place] office immediately following discharge.  Leave shield in place until visit.  All paperwork with discharge instructions will be given at the office.  [facility] Address:  [address]  [place], [place] [address]             Monitored Anesthesia Care, Care After These instructions provide you with information about caring for yourself after your procedure. Your health care provider may also give you more specific instructions. Your treatment has been planned according to current medical practices, but problems sometimes occur. Call your health care provider if you have any problems or questions after your procedure. What can I expect after the procedure? After your procedure, you may:  Feel sleepy for several hours.  Feel clumsy and have poor balance for several hours.  Feel forgetful about what happened after the procedure.  Have poor judgment for several hours.  Feel nauseous or vomit.  Have a sore throat if you had a breathing tube during the procedure. Follow these instructions at home: For at least 24 hours after the procedure:      Have a responsible adult stay with you. It is important to have someone help care for you until you are awake and alert.  Rest as needed.  Do not: ? Participate in activities in which you could fall or become injured. ? Drive. ? Use heavy machinery. ? Drink alcohol. ? Take sleeping pills or medicines that cause drowsiness. ? Make important decisions or sign legal documents. ? Take care of children on your own. Eating and drinking  Follow the diet that is recommended by your health care provider.  If you vomit, drink water, juice, or soup when you can drink without vomiting.  Make sure you have little or no nausea before eating solid foods. General instructions  Take over-the-counter and  prescription medicines only as told by your health care provider.  If you have sleep apnea, surgery and certain medicines can increase your risk for breathing problems. Follow instructions from your health care provider about wearing your sleep device: ? Anytime you are sleeping, including during daytime naps. ? While taking prescription pain medicines, sleeping medicines, or medicines that make you drowsy.  If you smoke, do not smoke without supervision.  Keep all follow-up visits as told by your health care provider. This is important. Contact a health care provider if:  You keep feeling nauseous or you keep vomiting.  You feel light-headed.  You develop a rash.  You have a fever. Get help right away if:  You have trouble breathing. Summary  For several hours after your procedure, you may feel sleepy and have poor judgment.  Have a responsible adult stay with you for at least 24 hours or until you are awake and alert. This information is not intended to replace advice given to you by your health care provider. Make sure you discuss any questions you have with your health care provider. Document Revised: 01/01/2018 Document Reviewed: 01/24/2016 Elsevier Patient Education  2020 [employer]  

## 2020-09-28 NOTE — Anesthesia Preprocedure Evaluation (Signed)
Anesthesia Evaluation  Patient identified by MRN, date of birth, ID band Patient awake    Reviewed: Allergy & Precautions, H&P , NPO status , Patient's Chart, lab work & pertinent test results, reviewed documented beta blocker date and time   Airway Mallampati: II  TM Distance: >3 FB Neck ROM: full    Dental no notable dental hx.    Pulmonary neg pulmonary ROS,    Pulmonary exam normal breath sounds clear to auscultation       Cardiovascular Exercise Tolerance: Good hypertension, negative cardio ROS   Rhythm:regular Rate:Normal     Neuro/Psych negative neurological ROS  negative psych ROS   GI/Hepatic Neg liver ROS, GERD  Medicated,  Endo/Other  Hypothyroidism   Renal/GU ARFRenal disease  negative genitourinary   Musculoskeletal   Abdominal   Peds  Hematology  (+) Blood dyscrasia, anemia ,   Anesthesia Other Findings   Reproductive/Obstetrics negative OB ROS                             Anesthesia Physical Anesthesia Plan  ASA: II  Anesthesia Plan: MAC   Post-op Pain Management:    Induction:   PONV Risk Score and Plan:   Airway Management Planned:   Additional Equipment:   Intra-op Plan:   Post-operative Plan:   Informed Consent: I have reviewed the patients History and Physical, chart, labs and discussed the procedure including the risks, benefits and alternatives for the proposed anesthesia with the patient or authorized representative who has indicated his/her understanding and acceptance.     Dental Advisory Given  Plan Discussed with: CRNA  Anesthesia Plan Comments:         Anesthesia Quick Evaluation

## 2020-10-15 NOTE — H&P (Signed)
Surgical History & Physical  Patient Name: [name] DOB: August 15, 1933  Surgery: Cataract extraction with intraocular lens implant phacoemulsification; Right Eye  Surgeon: Fabio Pierce MD Surgery Date:  10/23/2020 Pre-Op Date:  10/05/2020  HPI: A 59 Yr. old female patient Pt is present for PO OS and pre op OD (Dr. Charise Killian) The patient is returning after cataract surgery. The left eye is affected. Status post cataract surgery, which began 1 weeks ago: Since the last visit, the affected area is doing well. The patient's vision is improved and stable. Patient is following medication instructions. The patient experiences no floaters. The patient complains of nighttime light - car headlights, street lamps etc. glare causing poor vision, which began many years ago. The right eye is affected. The episode is constant. The condition's severity is worsening. Pt states symptoms are negatively affecting pt's quality of life. Pt has bothersome dry eye. HPI Completed by Dr. Fabio Pierce  Medical History: Dry Eyes Cataracts Heart Problem High Blood Pressure Sjogren's Syndrome Raynaud's Disease Acid Refl... Thyroid Problems  Review of Systems Negative Allergic/Immunologic Negative Cardiovascular Negative Constitutional Negative Ear, Nose, Mouth & Throat Negative Endocrine Negative Eyes Negative Gastrointestinal Negative Genitourinary Negative Hemotologic/Lymphatic Negative Integumentary Negative Musculoskeletal Negative Neurological Negative Psychiatry Negative Respiratory  Social   Never smoked  Medication Genteal, Refresh omega 3, Prednisolone-gatiflox-bromfenac,  Armour thyroid, Fish Oil, HCTZ, Headache meds, Nexium, Lasix, Zoloft,   Sx/Procedures Punctal Plugs, Phaco c IOL with Dextenza,   Drug Allergies  Dust, Seasonal allergies, Cipro, Codeine,   History & Physical: Heent:  Cataract, Right eye NECK: supple without bruits LUNGS: lungs clear to auscultation CV: regular  rate and rhythm Abdomen: soft and non-tender  Impression & Plan: Assessment: 1.  CATARACT EXTRACTION STATUS; Left Eye (Z98.42) 2.  COMBINED FORMS AGE RELATED CATARACT; Right Eye (H25.811) 3.  TRICHIASIS NO ENTROPION; Left Lower Lid (H02.055)  Plan: 1.  1 week after cataract surgery. Doing well with improved vision and normal eye pressure. Call with any problems or concerns. Stop Drops - Dextenza. 2.  Cataract accounts for the patient's decreased vision. This visual impairment is not correctable with a tolerable change in glasses or contact lenses. Cataract surgery with an implantation of a new lens should significantly improve the visual and functional status of the patient. Discussed all risks, benefits, alternatives, and potential complications. Discussed the procedures and recovery. Patient desires to have surgery. A-scan ordered and performed today for intra-ocular lens calculations. The surgery will be performed in order to improve vision for driving, reading, and for eye examinations. Recommend phacoemulsification with intra-ocular lens. Recommend Dextenza for post-operative pain and inflammation. Right Eye. Surgery required to correct imbalance of vision. Dilates well - shugarcaine by protocol. 3.  Lashes removed with jeweler's forceps. No complications.

## 2020-10-16 DIAGNOSIS — I10 Essential (primary) hypertension: Secondary | ICD-10-CM | POA: Diagnosis not present

## 2020-10-16 DIAGNOSIS — E7849 Other hyperlipidemia: Secondary | ICD-10-CM | POA: Diagnosis not present

## 2020-10-20 ENCOUNTER — Encounter (HOSPITAL_COMMUNITY)
Admission: RE | Admit: 2020-10-20 | Discharge: 2020-10-20 | Disposition: A | Discharge status: Home / Self Care | Payer: Medicare Other | Source: Ambulatory Visit | Attending: Ophthalmology | Admitting: Ophthalmology

## 2020-10-20 ENCOUNTER — Other Ambulatory Visit: Payer: Self-pay

## 2020-10-21 ENCOUNTER — Other Ambulatory Visit: Payer: Self-pay

## 2020-10-21 ENCOUNTER — Other Ambulatory Visit (HOSPITAL_COMMUNITY)
Admission: RE | Admit: 2020-10-21 | Discharge: 2020-10-21 | Disposition: A | Discharge status: Home / Self Care | Payer: Medicare Other | Source: Ambulatory Visit | Attending: Ophthalmology | Admitting: Ophthalmology

## 2020-10-21 DIAGNOSIS — Z01818 Encounter for other preprocedural examination: Secondary | ICD-10-CM | POA: Diagnosis not present

## 2020-10-21 DIAGNOSIS — Z20822 Contact with and (suspected) exposure to covid-19: Secondary | ICD-10-CM | POA: Insufficient documentation

## 2020-10-22 LAB — SARS CORONAVIRUS 2 (TAT 6-24 HRS): SARS Coronavirus 2: NEGATIVE

## 2020-10-23 ENCOUNTER — Encounter (HOSPITAL_COMMUNITY): Payer: Self-pay | Admitting: Ophthalmology

## 2020-10-23 ENCOUNTER — Other Ambulatory Visit: Payer: Self-pay

## 2020-10-23 ENCOUNTER — Ambulatory Visit (HOSPITAL_COMMUNITY)
Admission: RE | Admit: 2020-10-23 | Discharge: 2020-10-23 | Disposition: A | Discharge status: Home / Self Care | Payer: Medicare Other | Attending: Ophthalmology | Admitting: Ophthalmology

## 2020-10-23 ENCOUNTER — Encounter (HOSPITAL_COMMUNITY)
Admission: RE | Disposition: A | Discharge status: Home / Self Care | Payer: Self-pay | Source: Home / Self Care | Attending: Ophthalmology

## 2020-10-23 ENCOUNTER — Ambulatory Visit (HOSPITAL_COMMUNITY): Payer: Medicare Other | Admitting: Certified Registered"

## 2020-10-23 DIAGNOSIS — H5711 Ocular pain, right eye: Secondary | ICD-10-CM | POA: Diagnosis not present

## 2020-10-23 DIAGNOSIS — H25811 Combined forms of age-related cataract, right eye: Secondary | ICD-10-CM | POA: Insufficient documentation

## 2020-10-23 DIAGNOSIS — Z79899 Other long term (current) drug therapy: Secondary | ICD-10-CM | POA: Diagnosis not present

## 2020-10-23 DIAGNOSIS — M3500 Sicca syndrome, unspecified: Secondary | ICD-10-CM | POA: Insufficient documentation

## 2020-10-23 DIAGNOSIS — H02055 Trichiasis without entropian left lower eyelid: Secondary | ICD-10-CM | POA: Insufficient documentation

## 2020-10-23 DIAGNOSIS — I7300 Raynaud's syndrome without gangrene: Secondary | ICD-10-CM | POA: Diagnosis not present

## 2020-10-23 DIAGNOSIS — Z9842 Cataract extraction status, left eye: Secondary | ICD-10-CM | POA: Diagnosis not present

## 2020-10-23 DIAGNOSIS — R6889 Other general symptoms and signs: Secondary | ICD-10-CM | POA: Diagnosis not present

## 2020-10-23 HISTORY — PX: CATARACT EXTRACTION W/PHACO: SHX586

## 2020-10-23 SURGERY — PHACOEMULSIFICATION, CATARACT, WITH IOL INSERTION
Anesthesia: General | Site: Eye | Laterality: Right

## 2020-10-23 MED ORDER — POVIDONE-IODINE 5 % OP SOLN
OPHTHALMIC | Status: DC | PRN
  Administered 2020-10-23: 1 via OPHTHALMIC

## 2020-10-23 MED ORDER — TETRACAINE HCL 0.5 % OP SOLN
1.0000 [drp] | OPHTHALMIC | Status: AC | PRN
  Administered 2020-10-23 (×3): 1 [drp] via OPHTHALMIC

## 2020-10-23 MED ORDER — DEXAMETHASONE 0.4 MG OP INST
VAGINAL_INSERT | OPHTHALMIC | Status: DC | PRN
  Administered 2020-10-23: 0.4 mg via OPHTHALMIC

## 2020-10-23 MED ORDER — DEXAMETHASONE 0.4 MG OP INST
VAGINAL_INSERT | OPHTHALMIC | Status: AC
  Filled 2020-10-23: qty 1

## 2020-10-23 MED ORDER — TROPICAMIDE 1 % OP SOLN
1.0000 [drp] | OPHTHALMIC | Status: AC
  Administered 2020-10-23 (×3): 1 [drp] via OPHTHALMIC

## 2020-10-23 MED ORDER — PROVISC 10 MG/ML IO SOLN
INTRAOCULAR | Status: DC | PRN
  Administered 2020-10-23: 0.85 mL via INTRAOCULAR

## 2020-10-23 MED ORDER — LIDOCAINE HCL 3.5 % OP GEL
1.0000 "application " | Freq: Once | OPHTHALMIC | Status: AC
  Administered 2020-10-23: 1 via OPHTHALMIC

## 2020-10-23 MED ORDER — EPINEPHRINE PF 1 MG/ML IJ SOLN
INTRAOCULAR | Status: DC | PRN
  Administered 2020-10-23: 500 mL

## 2020-10-23 MED ORDER — EPINEPHRINE PF 1 MG/ML IJ SOLN
INTRAMUSCULAR | Status: AC
  Filled 2020-10-23: qty 2

## 2020-10-23 MED ORDER — STERILE WATER FOR IRRIGATION IR SOLN
Status: DC | PRN
  Administered 2020-10-23: 250 mL

## 2020-10-23 MED ORDER — SODIUM HYALURONATE 23 MG/ML IO SOLN
INTRAOCULAR | Status: DC | PRN
  Administered 2020-10-23: 0.6 mL via INTRAOCULAR

## 2020-10-23 MED ORDER — LIDOCAINE HCL (PF) 1 % IJ SOLN
INTRAOCULAR | Status: DC | PRN
  Administered 2020-10-23: 1 mL via OPHTHALMIC

## 2020-10-23 MED ORDER — BSS IO SOLN
INTRAOCULAR | Status: DC | PRN
  Administered 2020-10-23: 15 mL via INTRAOCULAR

## 2020-10-23 MED ORDER — PHENYLEPHRINE HCL 2.5 % OP SOLN
1.0000 [drp] | OPHTHALMIC | Status: AC | PRN
  Administered 2020-10-23 (×3): 1 [drp] via OPHTHALMIC

## 2020-10-23 SURGICAL SUPPLY — 17 items
CLOTH BEACON ORANGE TIMEOUT ST (SAFETY) ×1 IMPLANT
DEVICE MILOOP (MISCELLANEOUS) IMPLANT
EYE SHIELD UNIVERSAL CLEAR (GAUZE/BANDAGES/DRESSINGS) ×1 IMPLANT
GLOVE BIOGEL PI IND STRL 7.0 (GLOVE) IMPLANT
GLOVE BIOGEL PI INDICATOR 7.0 (GLOVE) ×2
MILOOP DEVICE (MISCELLANEOUS)
NDL HYPO 18GX1.5 BLUNT FILL (NEEDLE) IMPLANT
NEEDLE HYPO 18GX1.5 BLUNT FILL (NEEDLE) ×2 IMPLANT
PAD ARMBOARD 7.5X6 YLW CONV (MISCELLANEOUS) ×1 IMPLANT
RING MALYGIN (MISCELLANEOUS) IMPLANT
RING MALYGIN 7.0 (MISCELLANEOUS) IMPLANT
SYR TB 1ML LL NO SAFETY (SYRINGE) ×1 IMPLANT
TAPE SURG TRANSPORE 1 IN (GAUZE/BANDAGES/DRESSINGS) IMPLANT
TAPE SURGICAL TRANSPORE 1 IN (GAUZE/BANDAGES/DRESSINGS) ×2
TECNIS 1-PIECE IOL (Intraocular Lens) ×1 IMPLANT
VISCOELASTIC ADDITIONAL (OPHTHALMIC RELATED) IMPLANT
WATER STERILE IRR 250ML POUR (IV SOLUTION) ×1 IMPLANT

## 2020-10-23 NOTE — Op Note (Signed)
Date of procedure: 10/23/20  Pre-operative diagnosis:  Visually significant combined form age-related cataract, Right Eye (H25.811)  Post-operative diagnosis:   1. Visually significant combined form age-related cataract, Right Eye (H25.811) 2. Pain and inflammation following cataract surgery Right Eye (H57.11)  Procedure:  1. Removal of cataract via phacoemulsification and insertion of intra-ocular lens Johnson and [place]  +19.5D into the capsular bag of the Right Eye 2. Placement of Dextenza insert, Right Eye  Attending surgeon: Gerda Diss. Wrzosek, MD, MA  Anesthesia: MAC, Topical Akten  Complications: None  Estimated Blood Loss: <53m (minimal)  Specimens: None  Implants: As above  Indications:  Visually significant age-related cataract, Right Eye  Procedure:  The patient was seen and identified in the pre-operative area. The operative eye was identified and dilated.  The operative eye was marked.  Topical anesthesia was administered to the operative eye.     The patient was then to the operative suite and placed in the supine position.  A timeout was performed confirming the patient, procedure to be performed, and all other relevant information.   The patient's face was prepped and draped in the usual fashion for intra-ocular surgery.  A lid speculum was placed into the operative eye and the surgical microscope moved into place and focused.  A superotemporal paracentesis was created using a 20 gauge paracentesis blade.  Shugarcaine was injected into the anterior chamber.  Viscoelastic was injected into the anterior chamber.  A temporal clear-corneal main wound incision was created using a 2.420mmicrokeratome.  A continuous curvilinear capsulorrhexis was initiated using an irrigating cystitome and completed using capsulorrhexis forceps.  Hydrodissection and hydrodeliniation were performed.  Viscoelastic was injected into the anterior chamber.  A phacoemulsification handpiece  and a chopper as a second instrument were used to remove the nucleus and epinucleus. The irrigation/aspiration handpiece was used to remove any remaining cortical material.   The capsular bag was reinflated with viscoelastic, checked, and found to be intact.  The intraocular lens was inserted into the capsular bag.  The irrigation/aspiration handpiece was used to remove any remaining viscoelastic.  The clear corneal wound and paracentesis wounds were then hydrated and checked with Weck-Cels to be watertight.  The lid-speculum was removed.   A Dextenza implant was placed in the upper canaliculus after dilation without complication.  The drape was removed.  The patient's face was cleaned with a wet and dry 4x4. A clear shield was taped over the eye. The patient was taken to the post-operative care unit in good condition, having tolerated the procedure well.  Post-Op Instructions: [facility] and will receive all other orders and instructions.

## 2020-10-23 NOTE — Anesthesia Postprocedure Evaluation (Signed)
Anesthesia Post Note  Patient: [name]  Procedure(s) Performed: CATARACT EXTRACTION PHACO AND INTRAOCULAR LENS PLACEMENT RIGHT EYE with PLACEMENT OF CORTICOSTERIOD (Right Eye)  Patient location during evaluation: Phase II Anesthesia Type: General Level of consciousness: awake and alert and oriented Pain management: pain level controlled Vital Signs Assessment: post-procedure vital signs reviewed and stable Respiratory status: spontaneous breathing, nonlabored ventilation and respiratory function stable Cardiovascular status: blood pressure returned to baseline and stable Postop Assessment: no apparent nausea or vomiting Anesthetic complications: no   No complications documented.   Last Vitals:  Vitals:   10/23/20 0840  BP: 137/72  Pulse: 96  Resp: (!) 23  Temp: 36.6 C  SpO2: 96%    Last Pain:  Vitals:   10/23/20 0840  TempSrc: Oral  PainSc: 6                  Julian Reil

## 2020-10-23 NOTE — Anesthesia Procedure Notes (Signed)
Procedure Name: MAC Date/Time: 10/23/2020 9:06 AM Performed by: Orlie Dakin, CRNA Pre-anesthesia Checklist: Patient identified, Emergency Drugs available, Suction available and Patient being monitored Patient Re-evaluated:Patient Re-evaluated prior to induction Oxygen Delivery Method: Nasal cannula Placement Confirmation: positive ETCO2

## 2020-10-23 NOTE — Anesthesia Preprocedure Evaluation (Signed)
Anesthesia Evaluation  Patient identified by MRN, date of birth, ID band Patient awake    Reviewed: Allergy & Precautions, H&P , NPO status , Patient's Chart, lab work & pertinent test results, reviewed documented beta blocker date and time   Airway Mallampati: II  TM Distance: >3 FB Neck ROM: full    Dental no notable dental hx.    Pulmonary neg pulmonary ROS,    Pulmonary exam normal breath sounds clear to auscultation       Cardiovascular Exercise Tolerance: Good hypertension, +CHF   Rhythm:regular Rate:Normal     Neuro/Psych negative neurological ROS  negative psych ROS   GI/Hepatic Neg liver ROS, GERD  Medicated,  Endo/Other  Hypothyroidism   Renal/GU Renal disease  negative genitourinary   Musculoskeletal   Abdominal   Peds  Hematology  (+) Blood dyscrasia, anemia ,   Anesthesia Other Findings   Reproductive/Obstetrics negative OB ROS                             Anesthesia Physical Anesthesia Plan  ASA: II  Anesthesia Plan: General   Post-op Pain Management:    Induction:   PONV Risk Score and Plan: Propofol infusion  Airway Management Planned:   Additional Equipment:   Intra-op Plan:   Post-operative Plan:   Informed Consent: I have reviewed the patients History and Physical, chart, labs and discussed the procedure including the risks, benefits and alternatives for the proposed anesthesia with the patient or authorized representative who has indicated his/her understanding and acceptance.     Dental Advisory Given  Plan Discussed with: CRNA  Anesthesia Plan Comments:         Anesthesia Quick Evaluation

## 2020-10-23 NOTE — Discharge Instructions (Signed)
Please discharge patient when stable, will follow up today with Dr. Tharon Bomar at the [facility] [place] office immediately following discharge.  Leave shield in place until visit.  All paperwork with discharge instructions will be given at the office.   [facility] Address:  [address]  [place], [place] [address]  

## 2020-10-23 NOTE — Interval H&P Note (Signed)
History and Physical Interval Note:  10/23/2020 8:55 AM  [name]  has presented today for surgery, with the diagnosis of Nuclear sclerotic cataract - Right eye.  The various methods of treatment have been discussed with the patient and family. After consideration of risks, benefits and other options for treatment, the patient has consented to  Procedure(s) with comments: CATARACT EXTRACTION PHACO AND INTRAOCULAR LENS PLACEMENT (IOC) (Right) - right as a surgical intervention.  The patient's history has been reviewed, patient examined, no change in status, stable for surgery.  I have reviewed the patient's chart and labs.  Questions were answered to the patient's satisfaction.     Fabio Pierce

## 2020-10-23 NOTE — Transfer of Care (Signed)
Immediate Anesthesia Transfer of Care Note  Patient: [name]  Procedure(s) Performed: CATARACT EXTRACTION PHACO AND INTRAOCULAR LENS PLACEMENT RIGHT EYE with PLACEMENT OF CORTICOSTERIOD (Right Eye)  Patient Location: Short Stay  Anesthesia Type:MAC  Level of Consciousness: awake, alert  and oriented  Airway & Oxygen Therapy: Patient Spontanous Breathing  Post-op Assessment: Report given to RN, Post -op Vital signs reviewed and stable and Patient moving all extremities X 4  Post vital signs: Reviewed and stable  Last Vitals:  Vitals Value Taken Time  BP    Temp    Pulse    Resp    SpO2      Last Pain:  Vitals:   10/23/20 0840  TempSrc: Oral  PainSc: 6          Complications: No complications documented.

## 2020-10-26 ENCOUNTER — Encounter (HOSPITAL_COMMUNITY): Payer: Self-pay | Admitting: Ophthalmology

## 2020-11-03 ENCOUNTER — Other Ambulatory Visit (HOSPITAL_COMMUNITY): Payer: Medicare Other

## 2020-11-06 ENCOUNTER — Other Ambulatory Visit (HOSPITAL_COMMUNITY): Payer: Medicare Other

## 2020-11-26 ENCOUNTER — Encounter (HOSPITAL_COMMUNITY): Payer: Self-pay

## 2020-11-26 ENCOUNTER — Encounter (HOSPITAL_COMMUNITY)
Admission: RE | Admit: 2020-11-26 | Discharge: 2020-11-26 | Disposition: A | Discharge status: Home / Self Care | Payer: Medicare Other | Source: Ambulatory Visit | Attending: Ophthalmology | Admitting: Ophthalmology

## 2020-11-26 ENCOUNTER — Other Ambulatory Visit: Payer: Self-pay

## 2020-11-26 ENCOUNTER — Other Ambulatory Visit (HOSPITAL_COMMUNITY)
Admission: RE | Admit: 2020-11-26 | Discharge: 2020-11-26 | Disposition: A | Discharge status: Home / Self Care | Payer: Medicare Other | Source: Ambulatory Visit | Attending: Ophthalmology | Admitting: Ophthalmology

## 2020-11-26 DIAGNOSIS — Z20822 Contact with and (suspected) exposure to covid-19: Secondary | ICD-10-CM | POA: Insufficient documentation

## 2020-11-26 DIAGNOSIS — Z01812 Encounter for preprocedural laboratory examination: Secondary | ICD-10-CM | POA: Insufficient documentation

## 2020-11-26 NOTE — H&P (Signed)
Surgical History & Physical  Patient Name: [name] DOB: [birth year]  Surgery: Removal of retained lens fragments; Right Eye  Surgeon: Fabio Pierce MD Surgery Date:  11/30/2020 Pre-Op Date:  11/26/2020  HPI: A 17 Yr. old female patient The patient is returning after cataract surgery. The right eye is affected. Status post cataract surgery, which began 4 weeks ago: Since the last visit, the affected area is tolerating. The patient's vision is blurry and has film sensation. Pt states she has kept right eye closed when look into distance. Pt states when both eyes are open she has double vision and increase tearing. Patient has completed PO drops as instructed. Pt denies any increase in floaters but has noticed "reflection" or "bright light" temporally OD. Pt states OS is doing well and has not concerns. HPI was performed by Fabio Pierce .  Medical History: Dry Eyes Cataracts Heart Problem High Blood Pressure Sjogren's Syndrome Raynaud's Disease Acid Refl... Thyroid Problems  Review of Systems Negative Allergic/Immunologic Negative Cardiovascular Negative Constitutional Negative Ear, Nose, Mouth & Throat Negative Endocrine Negative Eyes Negative Gastrointestinal Negative Genitourinary Negative Hemotologic/Lymphatic Negative Integumentary Negative Musculoskeletal Negative Neurological Negative Psychiatry Negative Respiratory  Social   Never smoked    Medication Genteal, Refresh omega 3,  Armour thyroid, Fish Oil, HCTZ, Headache meds, Nexium, Lasix, Zoloft,   Sx/Procedures Punctal Plugs, Phaco c IOL with Dextenza, Phaco c IOL OD with Dextenza,   Drug Allergies  Dust, Seasonal allergies, Cipro, Codeine,   History & Physical: Heent:  Retained lens fragments, Right eye NECK: supple without bruits LUNGS: lungs clear to auscultation CV: regular rate and rhythm Abdomen: soft and non-tender  Impression & Plan: Assessment: 1.  LENS FRAGMENTS IN EYE FOLLOWING  CATARACT SURGERY (H59.021) 2.  CATARACT EXTRACTION STATUS; Right Eye (Z98.41, Z98.42)  Plan: 1.  Accounts for patient's symptoms. Symptomatic. Requires fragment removal. Risks, benefits, alternatives, and complications discussed. Schedule for surgery. 2.  Retained lens fragment as above.

## 2020-11-27 LAB — SARS CORONAVIRUS 2 (TAT 6-24 HRS): SARS Coronavirus 2: NEGATIVE

## 2020-11-30 ENCOUNTER — Encounter (HOSPITAL_COMMUNITY): Payer: Self-pay | Admitting: Ophthalmology

## 2020-11-30 ENCOUNTER — Encounter (HOSPITAL_COMMUNITY)
Admission: RE | Disposition: A | Discharge status: Home / Self Care | Payer: Self-pay | Source: Home / Self Care | Attending: Ophthalmology

## 2020-11-30 ENCOUNTER — Ambulatory Visit (HOSPITAL_COMMUNITY): Payer: Medicare Other | Admitting: Anesthesiology

## 2020-11-30 ENCOUNTER — Other Ambulatory Visit: Payer: Self-pay

## 2020-11-30 ENCOUNTER — Ambulatory Visit (HOSPITAL_COMMUNITY)
Admission: RE | Admit: 2020-11-30 | Discharge: 2020-11-30 | Disposition: A | Discharge status: Home / Self Care | Payer: Medicare Other | Attending: Ophthalmology | Admitting: Ophthalmology

## 2020-11-30 DIAGNOSIS — I509 Heart failure, unspecified: Secondary | ICD-10-CM | POA: Diagnosis not present

## 2020-11-30 DIAGNOSIS — Z79890 Hormone replacement therapy (postmenopausal): Secondary | ICD-10-CM | POA: Diagnosis not present

## 2020-11-30 DIAGNOSIS — Z885 Allergy status to narcotic agent status: Secondary | ICD-10-CM | POA: Insufficient documentation

## 2020-11-30 DIAGNOSIS — Z9841 Cataract extraction status, right eye: Secondary | ICD-10-CM | POA: Insufficient documentation

## 2020-11-30 DIAGNOSIS — H59021 Cataract (lens) fragments in eye following cataract surgery, right eye: Secondary | ICD-10-CM | POA: Insufficient documentation

## 2020-11-30 DIAGNOSIS — Z79899 Other long term (current) drug therapy: Secondary | ICD-10-CM | POA: Insufficient documentation

## 2020-11-30 DIAGNOSIS — Z9842 Cataract extraction status, left eye: Secondary | ICD-10-CM | POA: Insufficient documentation

## 2020-11-30 DIAGNOSIS — H59029 Cataract (lens) fragments in eye following cataract surgery, unspecified eye: Secondary | ICD-10-CM | POA: Diagnosis not present

## 2020-11-30 DIAGNOSIS — Z881 Allergy status to other antibiotic agents status: Secondary | ICD-10-CM | POA: Diagnosis not present

## 2020-11-30 HISTORY — PX: REMOVAL RETAINED LENS: SHX6464

## 2020-11-30 SURGERY — REMOVAL, RETAINED LENS MATTER
Anesthesia: Monitor Anesthesia Care | Site: Eye | Laterality: Right

## 2020-11-30 MED ORDER — EPINEPHRINE PF 1 MG/ML IJ SOLN
INTRAMUSCULAR | Status: AC
  Filled 2020-11-30: qty 2

## 2020-11-30 MED ORDER — CHLORHEXIDINE GLUCONATE 0.12 % MT SOLN: 15.0000 mL | Freq: Once | OROMUCOSAL | Status: DC

## 2020-11-30 MED ORDER — TETRACAINE HCL 0.5 % OP SOLN
1.0000 [drp] | OPHTHALMIC | Status: AC | PRN
  Administered 2020-11-30 (×3): 1 [drp] via OPHTHALMIC

## 2020-11-30 MED ORDER — ORAL CARE MOUTH RINSE: 15.0000 mL | Freq: Once | OROMUCOSAL | Status: DC

## 2020-11-30 MED ORDER — PROVISC 10 MG/ML IO SOLN
INTRAOCULAR | Status: DC | PRN
Start: 2020-11-30 — End: 2020-11-30
  Administered 2020-11-30: .85 mL via INTRAOCULAR

## 2020-11-30 MED ORDER — POVIDONE-IODINE 5 % OP SOLN
OPHTHALMIC | Status: DC | PRN
  Administered 2020-11-30: 1 via OPHTHALMIC

## 2020-11-30 MED ORDER — LIDOCAINE HCL 3.5 % OP GEL
1.0000 "application " | Freq: Once | OPHTHALMIC | Status: AC
  Administered 2020-11-30: 1 via OPHTHALMIC

## 2020-11-30 MED ORDER — LIDOCAINE HCL (PF) 1 % IJ SOLN
INTRAMUSCULAR | Status: DC | PRN
  Administered 2020-11-30: .2 mL

## 2020-11-30 MED ORDER — STERILE WATER FOR IRRIGATION IR SOLN
Status: DC | PRN
  Administered 2020-11-30: 250 mL

## 2020-11-30 MED ORDER — BSS IO SOLN
INTRAOCULAR | Status: DC | PRN
  Administered 2020-11-30: 500 mL

## 2020-11-30 MED ORDER — BSS IO SOLN
INTRAOCULAR | Status: DC | PRN
  Administered 2020-11-30: 15 mL via INTRAOCULAR

## 2020-11-30 SURGICAL SUPPLY — 10 items
CLOTH BEACON ORANGE TIMEOUT ST (SAFETY) ×1 IMPLANT
EYE SHIELD UNIVERSAL CLEAR (GAUZE/BANDAGES/DRESSINGS) ×1 IMPLANT
GLOVE SURG UNDER POLY LF SZ6.5 (GLOVE) ×1 IMPLANT
GLOVE SURG UNDER POLY LF SZ7 (GLOVE) ×1 IMPLANT
NDL HYPO 18GX1.5 BLUNT FILL (NEEDLE) IMPLANT
NEEDLE HYPO 18GX1.5 BLUNT FILL (NEEDLE) ×2 IMPLANT
PAD ARMBOARD 7.5X6 YLW CONV (MISCELLANEOUS) ×1 IMPLANT
SYR TB 1ML LL NO SAFETY (SYRINGE) ×1 IMPLANT
TAPE SURG TRANSPORE 1 IN (GAUZE/BANDAGES/DRESSINGS) IMPLANT
TAPE SURGICAL TRANSPORE 1 IN (GAUZE/BANDAGES/DRESSINGS) ×2

## 2020-11-30 NOTE — Transfer of Care (Signed)
Immediate Anesthesia Transfer of Care Note  Patient: [name]  Procedure(s) Performed: REMOVAL RETAINED LENS FRAGMENT (Right Eye)  Patient Location: Short Stay  Anesthesia Type:MAC  Level of Consciousness: awake, alert  and oriented  Airway & Oxygen Therapy: Patient Spontanous Breathing  Post-op Assessment: Report given to RN and Post -op Vital signs reviewed and stable  Post vital signs: Reviewed and stable  Last Vitals:  Vitals Value Taken Time  BP    Temp    Pulse    Resp    SpO2      Last Pain: There were no vitals filed for this visit.       Complications: No complications documented.

## 2020-11-30 NOTE — Anesthesia Procedure Notes (Signed)
Procedure Name: MAC Date/Time: 11/30/2020 1:01 PM Performed by: Orlie Dakin, CRNA Pre-anesthesia Checklist: Patient identified, Suction available, Emergency Drugs available and Patient being monitored Patient Re-evaluated:Patient Re-evaluated prior to induction Oxygen Delivery Method: Nasal cannula Placement Confirmation: positive ETCO2

## 2020-11-30 NOTE — Anesthesia Preprocedure Evaluation (Signed)
Anesthesia Evaluation  Patient identified by MRN, date of birth, ID band Patient awake    Reviewed: Allergy & Precautions, NPO status , Patient's Chart, lab work & pertinent test results  History of Anesthesia Complications Negative for: history of anesthetic complications  Airway Mallampati: II  TM Distance: >3 FB Neck ROM: Full    Dental  (+) Dental Advisory Given, Missing, Poor Dentition   Pulmonary neg pulmonary ROS,    Pulmonary exam normal breath sounds clear to auscultation       Cardiovascular hypertension, Pt. on medications +CHF  Normal cardiovascular exam Rhythm:Regular Rate:Normal     Neuro/Psych negative neurological ROS  negative psych ROS   GI/Hepatic GERD  Medicated and Controlled,(+) Cirrhosis  (portal HTN)      ,   Endo/Other  Hypothyroidism   Renal/GU Renal disease     Musculoskeletal Sjogren's syndrome     Abdominal   Peds  Hematology  (+) anemia ,   Anesthesia Other Findings   Reproductive/Obstetrics                             Anesthesia Physical Anesthesia Plan  ASA: II  Anesthesia Plan: MAC   Post-op Pain Management:    Induction: Intravenous  PONV Risk Score and Plan:   Airway Management Planned: Nasal Cannula and Natural Airway  Additional Equipment:   Intra-op Plan:   Post-operative Plan:   Informed Consent: I have reviewed the patients History and Physical, chart, labs and discussed the procedure including the risks, benefits and alternatives for the proposed anesthesia with the patient or authorized representative who has indicated his/her understanding and acceptance.     Dental advisory given  Plan Discussed with: Surgeon and CRNA  Anesthesia Plan Comments:         Anesthesia Quick Evaluation

## 2020-11-30 NOTE — Discharge Instructions (Signed)
Please discharge patient when stable, will follow up today with Dr. Darcey Demma at the [facility] office immediately following discharge.  Leave shield in place until visit.  All paperwork with discharge instructions will be given at the office.  [facility] Address:  [address]  [place], [place] [address]  

## 2020-11-30 NOTE — Op Note (Signed)
Date of procedure: 11/30/20  Pre-operative diagnosis: Retained nuclear lens fragment after cataract surgery, right eye  Post-operative diagnosis: Retained nuclear lens fragment after cataract surgery, right eye  Procedure: Removal of retained nuclear lens fragment using irrigation and aspiration, Right Eye  Attending surgeon: Gerda Diss. Oneill Bais, MD, MA  Anesthesia: MAC, Topical Akten  Complications: None  Estimated Blood Loss: <27m (minimal)  Specimens: None  Implants: As above  Indications:  Retained nuclear lens fragment after cataract surgery with significant inflammation, right eye  Procedure:  The patient was seen and identified in the pre-operative area. The operative eye was identified and dilated.  The operative eye was marked.  Topical anesthesia was administered to the operative eye.     The patient was then to the operative suite and placed in the supine position.  A timeout was performed confirming the patient, procedure to be performed, and all other relevant information.   The patient's face was prepped and draped in the usual fashion for intra-ocular surgery.  A lid speculum was placed into the operative eye and the surgical microscope moved into place and focused.  A superotemporal paracentesis was created using a 20 gauge paracentesis blade.  Lidocaine was injected into the anterior chamber.  Viscoelastic was injected into the anterior chamber.  An inferior paracentesis wound was created.  The retained lens fragment was visualized.  The fragment was removed from the anterior chamber using bimanual irrigation and aspiration without complication. The irrigation/aspiration handpiece was used to remove any remaining viscoelastic.  The paracentesis wounds were then hydrated and checked with Weck-Cels to be watertight.  The lid-speculum and drape was removed, and the patient's face was cleaned with a wet and dry 4x4.  A clear shield was taped over the eye. The patient was taken to  the post-operative care unit in good condition, having tolerated the procedure well.  [facility]Op Instructions: [facility] and will receive all other orders and instructions.

## 2020-11-30 NOTE — Interval H&P Note (Signed)
History and Physical Interval Note:  11/30/2020 12:52 PM  [name]  has presented today for surgery, with the diagnosis of retained lens fragment.  The various methods of treatment have been discussed with the patient and family. After consideration of risks, benefits and other options for treatment, the patient has consented to  Procedure(s): REMOVAL RETAINED LENS FRAGMENT (Right) as a surgical intervention.  The patient's history has been reviewed, patient examined, no change in status, stable for surgery.  I have reviewed the patient's chart and labs.  Questions were answered to the patient's satisfaction.     Fabio Pierce

## 2020-11-30 NOTE — Anesthesia Postprocedure Evaluation (Signed)
Anesthesia Post Note  Patient: [name]  Procedure(s) Performed: REMOVAL RETAINED LENS FRAGMENT (Right Eye)  Patient location during evaluation: Phase II Anesthesia Type: MAC Level of consciousness: awake and alert and oriented Pain management: pain level controlled Vital Signs Assessment: post-procedure vital signs reviewed and stable Respiratory status: spontaneous breathing, nonlabored ventilation and respiratory function stable Cardiovascular status: blood pressure returned to baseline and stable Postop Assessment: no apparent nausea or vomiting Anesthetic complications: no   No complications documented.   Last Vitals: There were no vitals filed for this visit.  Last Pain: There were no vitals filed for this visit.               Orlie Dakin

## 2020-12-01 ENCOUNTER — Encounter (HOSPITAL_COMMUNITY): Payer: Self-pay | Admitting: Ophthalmology

## 2020-12-01 NOTE — Progress Notes (Signed)
Pt complains of right eye feeling like " something is in my eye, feels like grit", also seeing double and blurry.

## 2020-12-03 NOTE — Progress Notes (Signed)
RN called pt back today for a f/u call to conversation on 2/14. Pt stated things have gotten a little better. She said double-vision is gone, but still can't see clear out of the right eye and still feels like there is grit in her eye. At times she states she see black spots floating in eye. No complaint of pain to the eye.

## 2020-12-04 NOTE — Progress Notes (Signed)
RN spoke to Dr. June Leap today on 2/18 to make him aware of conversations this RN has had with patient's concerns following eye surgery. Pt said her condition is unchanged from yesterday. RN told pt that feeling like grit in the eye was normal and to keep using her eyedrops as prescribed. Pt had no other concerns and said her f/u appt will be on Monday 2/21.

## 2020-12-11 DIAGNOSIS — I5030 Unspecified diastolic (congestive) heart failure: Secondary | ICD-10-CM | POA: Diagnosis not present

## 2020-12-11 DIAGNOSIS — R5383 Other fatigue: Secondary | ICD-10-CM | POA: Diagnosis not present

## 2020-12-11 DIAGNOSIS — I270 Primary pulmonary hypertension: Secondary | ICD-10-CM | POA: Diagnosis not present

## 2020-12-11 DIAGNOSIS — L409 Psoriasis, unspecified: Secondary | ICD-10-CM | POA: Diagnosis not present

## 2020-12-11 DIAGNOSIS — E538 Deficiency of other specified B group vitamins: Secondary | ICD-10-CM | POA: Diagnosis not present

## 2020-12-11 DIAGNOSIS — Z681 Body mass index (BMI) 19 or less, adult: Secondary | ICD-10-CM | POA: Diagnosis not present

## 2020-12-11 DIAGNOSIS — Z7189 Other specified counseling: Secondary | ICD-10-CM | POA: Diagnosis not present

## 2020-12-11 DIAGNOSIS — E7849 Other hyperlipidemia: Secondary | ICD-10-CM | POA: Diagnosis not present

## 2020-12-11 DIAGNOSIS — E559 Vitamin D deficiency, unspecified: Secondary | ICD-10-CM | POA: Diagnosis not present

## 2020-12-11 DIAGNOSIS — E441 Mild protein-calorie malnutrition: Secondary | ICD-10-CM | POA: Diagnosis not present

## 2020-12-11 DIAGNOSIS — E039 Hypothyroidism, unspecified: Secondary | ICD-10-CM | POA: Diagnosis not present

## 2020-12-11 DIAGNOSIS — Z1331 Encounter for screening for depression: Secondary | ICD-10-CM | POA: Diagnosis not present

## 2020-12-11 DIAGNOSIS — Z1389 Encounter for screening for other disorder: Secondary | ICD-10-CM | POA: Diagnosis not present

## 2020-12-11 DIAGNOSIS — D696 Thrombocytopenia, unspecified: Secondary | ICD-10-CM | POA: Diagnosis not present

## 2020-12-14 DIAGNOSIS — E7849 Other hyperlipidemia: Secondary | ICD-10-CM | POA: Diagnosis not present

## 2020-12-14 DIAGNOSIS — I10 Essential (primary) hypertension: Secondary | ICD-10-CM | POA: Diagnosis not present

## 2020-12-25 ENCOUNTER — Emergency Department (HOSPITAL_COMMUNITY): Payer: Medicare Other

## 2020-12-25 ENCOUNTER — Other Ambulatory Visit: Payer: Self-pay

## 2020-12-25 ENCOUNTER — Inpatient Hospital Stay (HOSPITAL_COMMUNITY)
Admission: EM | Admit: 2020-12-25 | Discharge: 2021-01-15 | DRG: 308 | Disposition: E | Payer: Medicare Other | Attending: Internal Medicine | Admitting: Internal Medicine

## 2020-12-25 DIAGNOSIS — Z20822 Contact with and (suspected) exposure to covid-19: Secondary | ICD-10-CM | POA: Diagnosis present

## 2020-12-25 DIAGNOSIS — Z681 Body mass index (BMI) 19 or less, adult: Secondary | ICD-10-CM | POA: Diagnosis not present

## 2020-12-25 DIAGNOSIS — R64 Cachexia: Secondary | ICD-10-CM | POA: Diagnosis present

## 2020-12-25 DIAGNOSIS — I5030 Unspecified diastolic (congestive) heart failure: Secondary | ICD-10-CM | POA: Diagnosis not present

## 2020-12-25 DIAGNOSIS — E876 Hypokalemia: Secondary | ICD-10-CM

## 2020-12-25 DIAGNOSIS — E860 Dehydration: Secondary | ICD-10-CM | POA: Diagnosis present

## 2020-12-25 DIAGNOSIS — K219 Gastro-esophageal reflux disease without esophagitis: Secondary | ICD-10-CM | POA: Diagnosis present

## 2020-12-25 DIAGNOSIS — Z885 Allergy status to narcotic agent status: Secondary | ICD-10-CM

## 2020-12-25 DIAGNOSIS — K7460 Unspecified cirrhosis of liver: Secondary | ICD-10-CM | POA: Diagnosis present

## 2020-12-25 DIAGNOSIS — Z90710 Acquired absence of both cervix and uterus: Secondary | ICD-10-CM

## 2020-12-25 DIAGNOSIS — I2510 Atherosclerotic heart disease of native coronary artery without angina pectoris: Secondary | ICD-10-CM | POA: Diagnosis present

## 2020-12-25 DIAGNOSIS — Z880 Allergy status to penicillin: Secondary | ICD-10-CM

## 2020-12-25 DIAGNOSIS — K766 Portal hypertension: Secondary | ICD-10-CM | POA: Diagnosis present

## 2020-12-25 DIAGNOSIS — E039 Hypothyroidism, unspecified: Secondary | ICD-10-CM | POA: Diagnosis present

## 2020-12-25 DIAGNOSIS — I110 Hypertensive heart disease with heart failure: Secondary | ICD-10-CM | POA: Diagnosis present

## 2020-12-25 DIAGNOSIS — I442 Atrioventricular block, complete: Principal | ICD-10-CM

## 2020-12-25 DIAGNOSIS — Z66 Do not resuscitate: Secondary | ICD-10-CM | POA: Diagnosis not present

## 2020-12-25 DIAGNOSIS — I5032 Chronic diastolic (congestive) heart failure: Secondary | ICD-10-CM | POA: Diagnosis present

## 2020-12-25 DIAGNOSIS — M3500 Sicca syndrome, unspecified: Secondary | ICD-10-CM | POA: Diagnosis present

## 2020-12-25 DIAGNOSIS — R188 Other ascites: Secondary | ICD-10-CM | POA: Diagnosis present

## 2020-12-25 DIAGNOSIS — R54 Age-related physical debility: Secondary | ICD-10-CM | POA: Diagnosis present

## 2020-12-25 DIAGNOSIS — Z882 Allergy status to sulfonamides status: Secondary | ICD-10-CM

## 2020-12-25 DIAGNOSIS — Z79890 Hormone replacement therapy (postmenopausal): Secondary | ICD-10-CM

## 2020-12-25 DIAGNOSIS — R531 Weakness: Secondary | ICD-10-CM | POA: Diagnosis not present

## 2020-12-25 DIAGNOSIS — Z7189 Other specified counseling: Secondary | ICD-10-CM | POA: Diagnosis not present

## 2020-12-25 DIAGNOSIS — E871 Hypo-osmolality and hyponatremia: Secondary | ICD-10-CM | POA: Diagnosis present

## 2020-12-25 DIAGNOSIS — Z79899 Other long term (current) drug therapy: Secondary | ICD-10-CM

## 2020-12-25 DIAGNOSIS — Z515 Encounter for palliative care: Secondary | ICD-10-CM

## 2020-12-25 DIAGNOSIS — I472 Ventricular tachycardia: Secondary | ICD-10-CM | POA: Diagnosis present

## 2020-12-25 DIAGNOSIS — R001 Bradycardia, unspecified: Secondary | ICD-10-CM | POA: Diagnosis not present

## 2020-12-25 DIAGNOSIS — R0902 Hypoxemia: Secondary | ICD-10-CM | POA: Diagnosis not present

## 2020-12-25 DIAGNOSIS — R627 Adult failure to thrive: Secondary | ICD-10-CM | POA: Diagnosis present

## 2020-12-25 DIAGNOSIS — E43 Unspecified severe protein-calorie malnutrition: Secondary | ICD-10-CM | POA: Diagnosis present

## 2020-12-25 DIAGNOSIS — E162 Hypoglycemia, unspecified: Secondary | ICD-10-CM | POA: Diagnosis not present

## 2020-12-25 DIAGNOSIS — Z961 Presence of intraocular lens: Secondary | ICD-10-CM | POA: Diagnosis present

## 2020-12-25 DIAGNOSIS — N179 Acute kidney failure, unspecified: Secondary | ICD-10-CM | POA: Diagnosis present

## 2020-12-25 DIAGNOSIS — I469 Cardiac arrest, cause unspecified: Secondary | ICD-10-CM | POA: Diagnosis not present

## 2020-12-25 DIAGNOSIS — Z888 Allergy status to other drugs, medicaments and biological substances status: Secondary | ICD-10-CM

## 2020-12-25 DIAGNOSIS — Z9841 Cataract extraction status, right eye: Secondary | ICD-10-CM

## 2020-12-25 DIAGNOSIS — R1310 Dysphagia, unspecified: Secondary | ICD-10-CM | POA: Diagnosis present

## 2020-12-25 DIAGNOSIS — Z789 Other specified health status: Secondary | ICD-10-CM | POA: Diagnosis not present

## 2020-12-25 DIAGNOSIS — E861 Hypovolemia: Secondary | ICD-10-CM | POA: Diagnosis present

## 2020-12-25 LAB — COMPREHENSIVE METABOLIC PANEL WITH GFR
ALT: 11 U/L (ref 0–44)
AST: 23 U/L (ref 15–41)
Albumin: 2.9 g/dL — ABNORMAL LOW (ref 3.5–5.0)
Alkaline Phosphatase: 119 U/L (ref 38–126)
Anion gap: 16 — ABNORMAL HIGH (ref 5–15)
BUN: 22 mg/dL (ref 8–23)
CO2: 28 mmol/L (ref 22–32)
Calcium: 14.2 mg/dL (ref 8.9–10.3)
Chloride: 84 mmol/L — ABNORMAL LOW (ref 98–111)
Creatinine, Ser: 0.85 mg/dL (ref 0.44–1.00)
GFR, Estimated: >60 mL/min (ref >60)
Glucose, Bld: 95 mg/dL (ref 70–99)
Potassium: 2.9 mmol/L — ABNORMAL LOW (ref 3.5–5.1)
Sodium: 128 mmol/L — ABNORMAL LOW (ref 135–145)
Total Bilirubin: 0.9 mg/dL (ref 0.3–1.2)
Total Protein: 7.3 g/dL (ref 6.5–8.1)

## 2020-12-25 LAB — URINALYSIS, ROUTINE W REFLEX MICROSCOPIC
Bacteria, UA: NONE SEEN
Bilirubin Urine: NEGATIVE
Glucose, UA: NEGATIVE mg/dL
Hgb urine dipstick: NEGATIVE
Ketones, ur: NEGATIVE mg/dL
Nitrite: NEGATIVE
Protein, ur: NEGATIVE mg/dL
Specific Gravity, Urine: 1.008 (ref 1.005–1.030)
pH: 6 (ref 5.0–8.0)

## 2020-12-25 LAB — CBC
HCT: 38.4 % (ref 36.0–46.0)
Hemoglobin: 11.8 g/dL — ABNORMAL LOW (ref 12.0–15.0)
MCH: 24.3 pg — ABNORMAL LOW (ref 26.0–34.0)
MCHC: 30.7 g/dL (ref 30.0–36.0)
MCV: 79 fL — ABNORMAL LOW (ref 80.0–100.0)
Platelets: 282 10*3/uL (ref 150–400)
RBC: 4.86 MIL/uL (ref 3.87–5.11)
RDW: 19.1 % — ABNORMAL HIGH (ref 11.5–15.5)
WBC: 12.4 10*3/uL — ABNORMAL HIGH (ref 4.0–10.5)
nRBC: 0 % (ref 0.0–0.2)

## 2020-12-25 LAB — RESP PANEL BY RT-PCR (FLU A&B, COVID) ARPGX2
Influenza A by PCR: NEGATIVE
Influenza B by PCR: NEGATIVE
SARS Coronavirus 2 by RT PCR: NEGATIVE

## 2020-12-25 LAB — LIPASE, BLOOD: Lipase: 24 U/L (ref 11–51)

## 2020-12-25 LAB — AMMONIA: Ammonia: 14 umol/L (ref 9–35)

## 2020-12-25 LAB — TSH: TSH: 5.417 u[IU]/mL — ABNORMAL HIGH (ref 0.350–4.500)

## 2020-12-25 LAB — MAGNESIUM: Magnesium: 1.4 mg/dL — ABNORMAL LOW (ref 1.7–2.4)

## 2020-12-25 MED ORDER — MAGNESIUM SULFATE 2 GM/50ML IV SOLN
2.0000 g | Freq: Once | INTRAVENOUS | Status: AC
  Administered 2020-12-25: 2 g via INTRAVENOUS
  Filled 2020-12-25: qty 50

## 2020-12-25 MED ORDER — POTASSIUM CHLORIDE 10 MEQ/100ML IV SOLN
10.0000 meq | INTRAVENOUS | Status: AC
  Administered 2020-12-25 – 2020-12-26 (×2): 10 meq via INTRAVENOUS
  Filled 2020-12-25 (×2): qty 100

## 2020-12-25 MED ORDER — SODIUM CHLORIDE 0.9 % IV BOLUS
1000.0000 mL | Freq: Once | INTRAVENOUS | Status: AC
  Administered 2020-12-25: 1000 mL via INTRAVENOUS

## 2020-12-25 MED ORDER — POTASSIUM CHLORIDE 10 MEQ/100ML IV SOLN
10.0000 meq | Freq: Once | INTRAVENOUS | Status: AC
  Administered 2020-12-25: 10 meq via INTRAVENOUS
  Filled 2020-12-25: qty 100

## 2020-12-25 MED ORDER — AMIODARONE HCL IN DEXTROSE 360-4.14 MG/200ML-% IV SOLN: 60.0000 mg/h | INTRAVENOUS | Status: DC

## 2020-12-25 MED ORDER — ATROPINE SULFATE 1 MG/ML IJ SOLN
1.0000 mg | Freq: Once | INTRAMUSCULAR | Status: AC
  Administered 2020-12-25: 1 mg via INTRAVENOUS

## 2020-12-25 MED ORDER — AMIODARONE HCL 150 MG/3ML IV SOLN: 150.0000 mg | Freq: Once | INTRAVENOUS | Status: DC

## 2020-12-25 NOTE — ED Notes (Signed)

## 2020-12-25 NOTE — ED Provider Notes (Signed)
[facility] EMERGENCY DEPARTMENT Provider Note   CSN: [id number] Arrival date & time: 01/09/2021  1422     History Chief Complaint  Patient presents with   Weakness   Fatigue    [name] is a 87 y.o. female with a history of CHF, liver cirrhosis, GERD, hypertension, Sjogren's disease and hypothyroidism presenting with increasing generalized weakness which started 5 days ago.  Daughter at the bedside states that she had a big day on Monday with Dr. Visits and generally takes a day to recover, however as the week has progressed she has become increasingly weak along with increasing poor appetite.  She denies any specific complaints of pain, has no nausea or vomiting, no abdominal pain, chest pain, sob, dysuria, cough.  Daughter states she is on Armour Thyroid which does not completely control her hypothyroidism, levothyroxine was added 2 days ago to this treatment.  She endorses poor p.o. intake, but does state that this is chronic probably secondary to the Sjogren's and reduced taste.  She has not wanted to eat anything this week.  She is not COVID vaccinated.  HPI     Past Medical History:  Diagnosis Date   CHF (congestive heart failure) (HCC)    Cirrhosis (HCC)    GERD (gastroesophageal reflux disease)    GI bleed    Hypertension    Sjogren's disease (HCC)    Thyroid disease    hypothyroidism    Patient Active Problem List   Diagnosis Date Noted   Decompensated heart failure (HCC) 10/31/2019   Acute on chronic anemia 09/17/2019   Elevated lactic acid level 09/17/2019   AKI (acute kidney injury) (HCC) 09/17/2019   GI hemorrhage 09/09/2019   Acute blood loss anemia 09/01/2019   Lower GI bleed 09/01/2019   Syncope 09/01/2019   Hypothyroidism 09/01/2019   Hypokalemia 09/01/2019   Chronic diarrhea 09/01/2019   Symptomatic anemia 08/27/2019   Portal hypertension --- liver cirrhosis 08/27/2019   Cirrhosis of liver with ascites and portal  hypertension 08/27/2019   Diarrhea 04/11/2019    Past Surgical History:  Procedure Laterality Date   CATARACT EXTRACTION W/PHACO Right 10/23/2020   Procedure: CATARACT EXTRACTION PHACO AND INTRAOCULAR LENS PLACEMENT RIGHT EYE with PLACEMENT OF CORTICOSTERIOD;  Surgeon: Fabio Pierce, MD;  Location: [facility];  Service: Ophthalmology;  Laterality: Right;  HUD14.97   COLONOSCOPY WITH ESOPHAGOGASTRODUODENOSCOPY (EGD) AND ESOPHAGEAL DILATION (ED)     years ago   COLONOSCOPY WITH PROPOFOL N/A 08/30/2019   Procedure: COLONOSCOPY WITH PROPOFOL;  Surgeon: Malissa Hippo, MD;  Location: [facility];  Service: Endoscopy;  Laterality: N/A;   complete hysterectomy     ESOPHAGOGASTRODUODENOSCOPY (EGD) WITH PROPOFOL N/A 08/30/2019   Procedure: ESOPHAGOGASTRODUODENOSCOPY (EGD) WITH PROPOFOL;  Surgeon: Malissa Hippo, MD;  Location: [facility];  Service: Endoscopy;  Laterality: N/A;   left rotator cuff     REMOVAL RETAINED LENS Right 11/30/2020   Procedure: REMOVAL RETAINED LENS FRAGMENT;  Surgeon: Fabio Pierce, MD;  Location: [facility];  Service: Ophthalmology;  Laterality: Right;     OB History   No obstetric history on file.     Family History  Problem Relation Age of Onset   Colon cancer Neg Hx    Colon polyps Neg Hx     Social History   Tobacco Use   Smoking status: Never Smoker   Smokeless tobacco: Never Used  Substance Use Topics   Alcohol use: Never   Drug use: Never    Home Medications Prior  to Admission medications   Medication Sig Start Date End Date Taking? Authorizing Provider  acetaminophen (TYLENOL) 325 MG tablet Take 325 mg by mouth every 6 (six) hours as needed for moderate pain or headache.   Yes [provider]  Ascorbic Acid (VITAMIN C) 500 MG CAPS Take 500 mg by mouth daily.   Yes [provider]  Cholecalciferol (VITAMIN D) 50 MCG (2000 UT) tablet Take 2,000-4,000 Units by mouth daily.   Yes [provider]  esomeprazole  (NEXIUM) 20 MG capsule Take 1 capsule (20 mg total) by mouth at bedtime. Patient taking differently: Take 20 mg by mouth daily. 09/01/19  Yes Erick BlinksMemon, Jehanzeb, MD  Ferrous Sulfate (IRON) 325 (65 Fe) MG TABS Take 1 tablet (325 mg total) by mouth 2 (two) times daily before lunch and supper. Patient taking differently: Take 325 mg by mouth 2 (two) times daily before lunch and supper. 09/18/19  Yes Emokpae, Courage, MD  furosemide (LASIX) 20 MG tablet Take 1 tablet (20 mg total) by mouth daily. 09/16/20  Yes Dolores Frameastaneda Mayorga, Daniel, MD  levothyroxine (SYNTHROID) 25 MCG tablet Take 25 mcg by mouth daily. 12/22/20  Yes [provider]  loperamide (IMODIUM) 2 MG capsule Take 2 mg by mouth 2 (two) times daily as needed for diarrhea or loose stools.    Yes [provider]  Multiple Minerals (CALCIUM/MAGNESIUM/ZINC PO) Take 1 tablet by mouth daily.   Yes [provider]  mupirocin ointment (BACTROBAN) 2 % Apply 1 application topically 2 (two) times daily. 12/15/20  Yes [provider]  neomycin-bacitracin-polymyxin (NEOSPORIN) ointment Apply 1 application topically as needed for wound care.   Yes [provider]  sertraline (ZOLOFT) 25 MG tablet Take 25 mg by mouth every morning.    Yes [provider]  simethicone (MYLICON) 125 MG chewable tablet Chew 125 mg by mouth in the morning and at bedtime.   Yes [provider]  spironolactone (ALDACTONE) 50 MG tablet Take 1 tablet (50 mg total) by mouth daily. 09/16/20  Yes Dolores Frameastaneda Mayorga, Daniel, MD  thyroid (ARMOUR) 120 MG tablet Take 120 mg by mouth daily before breakfast.   Yes [provider]  vitamin B-12 (CYANOCOBALAMIN) 1000 MCG tablet Take 1,000 mcg by mouth daily.   Yes [provider]  Wheat Dextrin (BENEFIBER DRINK MIX PO) Take 1 packet by mouth daily. With breakfast   Yes [provider]  moxifloxacin (VIGAMOX) 0.5 % ophthalmic solution Place 1 drop into the right eye  3 (three) times daily. Patient not taking: Reported on 13-Sep-2021    [provider]  nystatin (MYCOSTATIN) 100000 UNIT/ML suspension Take by mouth. Patient not taking: No sig reported 12/10/20   [provider]  prednisoLONE acetate (PRED FORTE) 1 % ophthalmic suspension Place 1 drop into the right eye See admin instructions. Instill 1 drop into operative eye 3 times daily starting 1 day after surgery Patient not taking: Reported on 13-Sep-2021 11/26/20   [provider]    Allergies    Ciprofloxacin, Asa [aspirin], Codeine, Milk-related compounds, Penicillins, Trazodone and nefazodone, Demerol, and Sulfa antibiotics  Review of Systems   Review of Systems  Constitutional: Positive for activity change, appetite change and fatigue. Negative for fever.  HENT: Negative for congestion and sore throat.   Eyes: Negative.   Respiratory: Negative for chest tightness and shortness of breath.   Cardiovascular: Negative for chest pain.  Gastrointestinal: Negative for abdominal pain, nausea and vomiting.  Genitourinary: Negative.  Negative for dysuria.  Musculoskeletal: Negative for arthralgias, joint swelling and neck pain.  Skin: Negative.  Negative for rash and wound.  Neurological: Positive for weakness. Negative for dizziness, light-headedness, numbness and headaches.  Psychiatric/Behavioral: Negative.   All other systems reviewed and are negative.   Physical Exam Updated Vital Signs BP (!) 109/47    Pulse 83    Temp 99.3 F (37.4 C) (Axillary)    Resp 20    SpO2 100%   Physical Exam Vitals and nursing note reviewed.  Constitutional:      General: She is not in acute distress.    Appearance: She is well-developed. She is ill-appearing.     Comments: Cachectic.  Appears weak.  Responsive, but speaking in whisper.  HENT:     Head: Normocephalic and atraumatic.     Mouth/Throat:     Mouth: Mucous membranes are dry.  Eyes:     Conjunctiva/sclera: Conjunctivae  normal.  Cardiovascular:     Rate and Rhythm: Normal rate. Rhythm irregular.     Heart sounds: Murmur heard.    Pulmonary:     Effort: Pulmonary effort is normal.     Breath sounds: Normal breath sounds. No wheezing.  Abdominal:     General: Bowel sounds are normal. There is no distension.     Palpations: Abdomen is soft.     Tenderness: There is no abdominal tenderness. There is no guarding.  Musculoskeletal:        General: Normal range of motion.     Cervical back: Normal range of motion.  Skin:    General: Skin is warm and dry.  Neurological:     General: No focal deficit present.     Mental Status: She is alert.     ED Results / Procedures / Treatments   Labs (all labs ordered are listed, but only abnormal results are displayed) Labs Reviewed  COMPREHENSIVE METABOLIC PANEL - Abnormal; Notable for the following components:      Result Value   Sodium 128 (*)    Potassium 2.9 (*)    Chloride 84 (*)    Calcium 14.2 (*)    Albumin 2.9 (*)    Anion gap 16 (*)    All other components within normal limits  CBC - Abnormal; Notable for the following components:   WBC 12.4 (*)    Hemoglobin 11.8 (*)    MCV 79.0 (*)    MCH 24.3 (*)    RDW 19.1 (*)    All other components within normal limits  URINALYSIS, ROUTINE W REFLEX MICROSCOPIC - Abnormal; Notable for the following components:   Leukocytes,Ua SMALL (*)    All other components within normal limits  TSH - Abnormal; Notable for the following components:   TSH 5.417 (*)    All other components within normal limits  RESP PANEL BY RT-PCR (FLU A&B, COVID) ARPGX2  LIPASE, BLOOD  AMMONIA  CALCIUM, IONIZED  PARATHYROID HORMONE, INTACT (NO CA)  MAGNESIUM    EKG EKG Interpretation  Date/Time:  Friday Jan 05, 2021 22:26:29 EST Ventricular Rate:  79 PR Interval:    QRS Duration: 156 QT Interval:  414 QTC Calculation: 475 R Axis:   -51 Text Interpretation: Sinus rhythm Atrial premature complexes Left bundle branch  block previous tracing showed complete heart block Confirmed by Richardean Canal [phone]) on 01/05/2021 10:33:06 PM   Radiology DG Chest Port 1 View  Result Date: 01-05-2021 CLINICAL DATA:  Weakness EXAM: PORTABLE CHEST 1 VIEW COMPARISON:  01/21/2020 FINDINGS: The heart  size and mediastinal contours are within normal limits. Elevation of the left hemidiaphragm. No acute appearing airspace opacity. The visualized skeletal structures are unremarkable. IMPRESSION: Elevation of the left hemidiaphragm. No acute appearing airspace opacity in [facility] portable projection. Electronically Signed   By: Lauralyn Primes M.D.   On: 01-23-21 19:02    Procedures Procedures   CRITICAL CARE Performed by: Burgess Amor Total critical care time: 45 minutes Critical care time was exclusive of separately billable procedures and treating other patients. Critical care was necessary to treat or prevent imminent or life-threatening deterioration. Critical care was time spent personally by me on the following activities: development of treatment plan with patient and/or surrogate as well as nursing, discussions with consultants, evaluation of patient's response to treatment, examination of patient, obtaining history from patient or surrogate, ordering and performing treatments and interventions, ordering and review of laboratory studies, ordering and review of radiographic studies, pulse oximetry and re-evaluation of patient's condition.   Medications Ordered in ED Medications  sodium chloride 0.9 % bolus 1,000 mL (0 mLs Intravenous Stopped 01-23-2021 1954)  atropine injection 1 mg (1 mg Intravenous Given 23-Jan-2021 1946)  potassium chloride 10 mEq in 100 mL IVPB (0 mEq Intravenous Stopped 01-23-2021 2129)  magnesium sulfate IVPB 2 g 50 mL (0 g Intravenous Stopped 2021/01/23 2149)    ED Course  I have reviewed the triage vital signs and the nursing notes.  Pertinent labs & imaging results that were available during my care of the patient  were reviewed by me and considered in my medical decision making (see chart for details).    MDM Rules/Calculators/A&P                          Pt with significantly elevated calcium level with profound weakness of 5 days duration.  Labs reviewed and discussed with pt and daughter at bedside.  Ekg reflecting complete heart block. Given atropine with no change in heart rhythm.  Her bp remained stable.  Discussed case with Dr. Hyacinth Meeker with cardiology - agreed with electrolyte corrections prior to consideration regarding need for pacemaker.  Requesting medical admission/ plan transfer to [facility].  9:25 PM New ekg with changes suggesting early torsades.  Magnesium IV started. Potassium replacement also running.  Call back to Dr. Hyacinth Meeker to advise will plan ed to ed transfer.  He will plan to see pt upon arrival.    Dr. Rubin Payor with ed aware of pt transfer.  Final Clinical Impression(s) / ED Diagnoses Final diagnoses:  Weakness  Hypercalcemia  Hypokalemia  Heart block AV complete [facility])    Rx / DC Orders ED Discharge Orders    None       Victoriano Lain 01/23/21 2309    Jacalyn Lefevre, MD January 23, 2021 2322

## 2020-12-25 NOTE — ED Notes (Signed)
Repeat EKG done. Given and seen by Dr Particia Nearing

## 2020-12-25 NOTE — ED Triage Notes (Signed)
Pt presents to ED BIB [facility] from [facility]. Pt presented there w/ CHB and runs of torsades. Pt disoriented to time and situation upon arrival. Denies pain.

## 2020-12-25 NOTE — ED Triage Notes (Signed)
Daughter concerned she may be dehydrated, states she takes meds for thyroid and it was changed yesterday

## 2020-12-25 NOTE — ED Notes (Signed)
Family pressed call bell and standing at the door yelling that nurse needs to get in here now.  Family stating that patients eyes had rolled back in her head and shaking like she was having a seizure.  patient's heart rate was 38 -40. O2 reading 72 percent. Consulting civil engineer in room.

## 2020-12-25 NOTE — H&P (Signed)
History and Physical   [name] YKD:[id number] DOB: [birth year] DOA: 01/03/2021  Referring MD/NP/PA: Dr. Silverio Lay  PCP: Assunta Found, MD   Outpatient Specialists: None  Patient coming from: [facility]  Chief Complaint: Complete heart block  HPI: [name] is a 87 y.o. female with medical history significant of coronary artery disease, Sjogren's syndrome, CHF, liver cirrhosis, GERD, hypothyroidism who was sent over from [facility] after patient presented with weakness found to have complete heart block with subsequent torsades.  Patient was given magnesium and potassium which were low.  Following treatment she was evaluated by cardiology and transferred here to be observed closely in case this has returned.  She is weak and debilitated and chronically ill looking.  Denied any specific complaint no chest pain.  Patient is mainly complaining of thirst.  She was noted to have reverted to sinus rhythm at the moment.  Patient is being admitted to the hospital for further evaluation and treatment..  ED Course: Temperature 99.3 blood pressure 150/90 pulse 129 initially respiratory 27 oxygen sats 80% on room air currently 100% on 2 L.  Sodium 128 potassium 2.9 chloride 84 CO2 28 glucose 95 BUN 22 creatinine 0.85 calcium 14.2 and a gap of 16.  White count 12.4 hemoglobin 11.8.  TSH of 5.417.  COVID-19 screen is negative.  Magnesium 1.4.  Urinalysis is negative.  Chest x-ray showed elevation of the left hemidiaphragm no acute airspace opacity.  Review of Systems: As per HPI otherwise 10 point review of systems negative.    Past Medical History:  Diagnosis Date  . CHF (congestive heart failure) (HCC)   . Cirrhosis (HCC)   . GERD (gastroesophageal reflux disease)   . GI bleed   . Hypertension   . Sjogren's disease (HCC)   . Thyroid disease    hypothyroidism    Past Surgical History:  Procedure Laterality Date  . CATARACT EXTRACTION W/PHACO Right 10/23/2020   Procedure:  CATARACT EXTRACTION PHACO AND INTRAOCULAR LENS PLACEMENT RIGHT EYE with PLACEMENT OF CORTICOSTERIOD;  Surgeon: Fabio Pierce, MD;  Location: [facility];  Service: Ophthalmology;  Laterality: Right;  [id number]  . COLONOSCOPY WITH ESOPHAGOGASTRODUODENOSCOPY (EGD) AND ESOPHAGEAL DILATION (ED)     years ago  . COLONOSCOPY WITH PROPOFOL N/A 08/30/2019   Procedure: COLONOSCOPY WITH PROPOFOL;  Surgeon: Malissa Hippo, MD;  Location: [facility];  Service: Endoscopy;  Laterality: N/A;  . complete hysterectomy    . ESOPHAGOGASTRODUODENOSCOPY (EGD) WITH PROPOFOL N/A 08/30/2019   Procedure: ESOPHAGOGASTRODUODENOSCOPY (EGD) WITH PROPOFOL;  Surgeon: Malissa Hippo, MD;  Location: [facility];  Service: Endoscopy;  Laterality: N/A;  . left rotator cuff    . REMOVAL RETAINED LENS Right 11/30/2020   Procedure: REMOVAL RETAINED LENS FRAGMENT;  Surgeon: Fabio Pierce, MD;  Location: [facility];  Service: Ophthalmology;  Laterality: Right;     reports that she has never smoked. She has never used smokeless tobacco. She reports that she does not drink alcohol and does not use drugs.  Allergies  Allergen Reactions  . Ciprofloxacin Other (See Comments)    NO REACTION LISTED  . Asa [Aspirin] Other (See Comments)    Rectal bleeding  . Codeine Swelling    Cough syrup with codeine   . Milk-Related Compounds Diarrhea  . Penicillins     Unknown reaction Did it involve swelling of the face/tongue/throat, SOB, or low BP? Unknown Did it involve sudden or severe rash/hives, skin peeling, or any reaction on the inside of your  mouth or nose? Unknown Did you need to seek medical attention at a hospital or doctor's office? Yes When did it last happen?Uknown If all above answers are "NO", may proceed with cephalosporin use. No issue with ceftriaxone  . Trazodone And Nefazodone Other (See Comments)    Confusion and hallucinations  . Demerol Palpitations  . Sulfa Antibiotics Rash    Family History  Problem  Relation Age of Onset  . Colon cancer Neg Hx   . Colon polyps Neg Hx      Prior to Admission medications   Medication Sig Start Date End Date Taking? Authorizing Provider  acetaminophen (TYLENOL) 325 MG tablet Take 325 mg by mouth every 6 (six) hours as needed for moderate pain or headache.   Yes [provider]  Ascorbic Acid (VITAMIN C) 500 MG CAPS Take 500 mg by mouth daily.   Yes [provider]  Cholecalciferol (VITAMIN D) 50 MCG (2000 UT) tablet Take 2,000-4,000 Units by mouth daily.   Yes [provider]  esomeprazole (NEXIUM) 20 MG capsule Take 1 capsule (20 mg total) by mouth at bedtime. Patient taking differently: Take 20 mg by mouth daily. 09/01/19  Yes Erick Blinks, MD  Ferrous Sulfate (IRON) 325 (65 Fe) MG TABS Take 1 tablet (325 mg total) by mouth 2 (two) times daily before lunch and supper. Patient taking differently: Take 325 mg by mouth 2 (two) times daily before lunch and supper. 09/18/19  Yes Emokpae, Courage, MD  furosemide (LASIX) 20 MG tablet Take 1 tablet (20 mg total) by mouth daily. 09/16/20  Yes Dolores Frame, MD  levothyroxine (SYNTHROID) 25 MCG tablet Take 25 mcg by mouth daily. 12/22/20  Yes [provider]  loperamide (IMODIUM) 2 MG capsule Take 2 mg by mouth 2 (two) times daily as needed for diarrhea or loose stools.    Yes [provider]  Multiple Minerals (CALCIUM/MAGNESIUM/ZINC PO) Take 1 tablet by mouth daily.   Yes [provider]  mupirocin ointment (BACTROBAN) 2 % Apply 1 application topically 2 (two) times daily. 12/15/20  Yes [provider]  neomycin-bacitracin-polymyxin (NEOSPORIN) ointment Apply 1 application topically as needed for wound care.   Yes [provider]  sertraline (ZOLOFT) 25 MG tablet Take 25 mg by mouth every morning.    Yes [provider]  simethicone (MYLICON) 125 MG chewable tablet Chew 125 mg by mouth in the morning and at bedtime.   Yes  [provider]  spironolactone (ALDACTONE) 50 MG tablet Take 1 tablet (50 mg total) by mouth daily. 09/16/20  Yes Dolores Frame, MD  thyroid (ARMOUR) 120 MG tablet Take 120 mg by mouth daily before breakfast.   Yes [provider]  vitamin B-12 (CYANOCOBALAMIN) 1000 MCG tablet Take 1,000 mcg by mouth daily.   Yes [provider]  Wheat Dextrin (BENEFIBER DRINK MIX PO) Take 1 packet by mouth daily. With breakfast   Yes [provider]  moxifloxacin (VIGAMOX) 0.5 % ophthalmic solution Place 1 drop into the right eye 3 (three) times daily. Patient not taking: Reported on 12/20/2020    [provider]  nystatin (MYCOSTATIN) 100000 UNIT/ML suspension Take by mouth. Patient not taking: No sig reported 12/10/20   [provider]  prednisoLONE acetate (PRED FORTE) 1 % ophthalmic suspension Place 1 drop into the right eye See admin instructions. Instill 1 drop into operative eye 3 times daily starting 1 day after surgery Patient not taking: Reported on 01/08/2021 11/26/20   [provider]    Physical Exam: Vitals:   07/11/21 2345 12/26/20 0000 12/26/20 0015 12/26/20 0030  BP: (!) 143/40 127/69 124/77 139/62  Pulse: 76 72 71 71  Resp: (!) 23 (!) 26 15 19   Temp:      TempSrc:      SpO2: 100% 100% 100% 100%      Constitutional: Chronically ill looking, cachectic, Vitals:   07/11/21 2345 12/26/20 0000 12/26/20 0015 12/26/20 0030  BP: (!) 143/40 127/69 124/77 139/62  Pulse: 76 72 71 71  Resp: (!) 23 (!) 26 15 19   Temp:      TempSrc:      SpO2: 100% 100% 100% 100%   Eyes: PERRL, lids and conjunctivae normal ENMT: Mucous membranes are dry. Posterior pharynx clear of any exudate or lesions.Normal dentition.  Neck: normal, supple, no masses, no thyromegaly Respiratory: Coarse breath sound with some basal crackles, Normal respiratory effort. No accessory muscle use.  Cardiovascular: Regular rate and rhythm, no murmurs / rubs  / gallops.  1+ extremity edema. 2+ pedal pulses. No carotid bruits.  Abdomen: no tenderness, no masses palpated. No hepatosplenomegaly. Bowel sounds positive.  Musculoskeletal: no clubbing / cyanosis. No joint deformity upper and lower extremities. Good ROM, no contractures. Normal muscle tone.  Skin: no rashes, lesions, ulcers. No induration Neurologic: CN 2-12 grossly intact. Sensation intact, DTR normal. Strength 5/5 in all 4.  Psychiatric: Drowsy, lethargic.     Labs on Admission: I have personally reviewed following labs and imaging studies  CBC: Recent Labs  Lab 07/11/21 1457  WBC 12.4*  HGB 11.8*  HCT 38.4  MCV 79.0*  PLT 282   Basic Metabolic Panel: Recent Labs  Lab 07/11/21 1457 07/11/21 2126  NA 128*  --   K 2.9*  --   CL 84*  --   CO2 28  --   GLUCOSE 95  --   BUN 22  --   CREATININE 0.85  --   CALCIUM 14.2*  --   MG  --  1.4*   GFR: CrCl cannot be calculated (Unknown ideal weight.). Liver Function Tests: Recent Labs  Lab 07/11/21 1457  AST 23  ALT 11  ALKPHOS 119  BILITOT 0.9  PROT 7.3  ALBUMIN 2.9*   Recent Labs  Lab 07/11/21 1457  LIPASE 24   Recent Labs  Lab 07/11/21 1852  AMMONIA 14   Coagulation Profile: No results for input(s): INR, PROTIME in the last 168 hours. Cardiac Enzymes: No results for input(s): CKTOTAL, CKMB, CKMBINDEX, TROPONINI in the last 168 hours. BNP (last 3 results) No results for input(s): PROBNP in the last 8760 hours. HbA1C: No results for input(s): HGBA1C in the last 72 hours. CBG: No results for input(s): GLUCAP in the last 168 hours. Lipid Profile: No results for input(s): CHOL, HDL, LDLCALC, TRIG, CHOLHDL, LDLDIRECT in the last 72 hours. Thyroid Function Tests: Recent Labs    07/11/21 1852  TSH 5.417*   Anemia Panel: No results for input(s): VITAMINB12, FOLATE, FERRITIN, TIBC, IRON, RETICCTPCT in the last 72 hours. Urine analysis:    Component Value Date/Time   COLORURINE YELLOW 2020-10-24 1449    APPEARANCEUR CLEAR 2020-10-24 1449   LABSPEC 1.008 2020-10-24 1449   PHURINE 6.0 2020-10-24 1449   GLUCOSEU NEGATIVE 2020-10-24 1449   HGBUR NEGATIVE 2020-10-24 1449   BILIRUBINUR NEGATIVE 2020-10-24 1449   KETONESUR NEGATIVE 2020-10-24 1449   PROTEINUR NEGATIVE 2020-10-24 1449   NITRITE NEGATIVE 2020-10-24 1449   LEUKOCYTESUR SMALL (A) 2020-10-24 1449  Sepsis Labs: (procalcitonin:4,lacticidven:4) ) Recent Results (from the past 240 hour(s))  Resp Panel by RT-PCR (Flu A&B, Covid) Nasopharyngeal Swab     Status: None   Collection Time: 01-24-2021  6:30 PM   Specimen: Nasopharyngeal Swab; Nasopharyngeal(NP) swabs in vial transport medium  Result Value Ref Range Status   SARS Coronavirus 2 by RT PCR NEGATIVE NEGATIVE Final    Comment: (NOTE) SARS-CoV-2 target nucleic acids are NOT DETECTED.  The SARS-CoV-2 RNA is generally detectable in upper respiratory specimens during the acute phase of infection. The lowest concentration of SARS-CoV-2 viral copies this assay can detect is 138 copies/mL. A negative result does not preclude SARS-Cov-2 infection and should not be used as the sole basis for treatment or other patient management decisions. A negative result may occur with  improper specimen collection/handling, submission of specimen other than nasopharyngeal swab, presence of viral mutation(s) within the areas targeted by this assay, and inadequate number of viral copies(<138 copies/mL). A negative result must be combined with clinical observations, patient history, and epidemiological information. The expected result is Negative.  Fact Sheet for Patients:  BloggerCourse.com  Fact Sheet for Healthcare Providers:  SeriousBroker.it  This test is no t yet approved or cleared by the [place] FDA and  has been authorized for detection and/or diagnosis of SARS-CoV-2 by FDA under an Emergency Use Authorization (EUA).  This EUA will remain  in effect (meaning this test can be used) for the duration of the COVID-19 declaration under Section 564(b)(1) of the Act, 21 U.S.C.section 360bbb-3(b)(1), unless the authorization is terminated  or revoked sooner.       Influenza A by PCR NEGATIVE NEGATIVE Final   Influenza B by PCR NEGATIVE NEGATIVE Final    Comment: (NOTE) The Xpert Xpress SARS-CoV-2/FLU/RSV plus assay is intended as an aid in the diagnosis of influenza from Nasopharyngeal swab specimens and should not be used as a sole basis for treatment. Nasal washings and aspirates are unacceptable for Xpert Xpress SARS-CoV-2/FLU/RSV testing.  Fact Sheet for Patients: BloggerCourse.com  Fact Sheet for Healthcare Providers: SeriousBroker.it  This test is not yet approved or cleared by the [place] FDA and has been authorized for detection and/or diagnosis of SARS-CoV-2 by FDA under an Emergency Use Authorization (EUA). This EUA will remain in effect (meaning this test can be used) for the duration of the COVID-19 declaration under Section 564(b)(1) of the Act, 21 U.S.C. section 360bbb-3(b)(1), unless the authorization is terminated or revoked.  Performed at [facility], [address], [place], [place] [address]      Radiological Exams on Admission: DG Chest Port 1 View  Result Date: January 24, 2021 CLINICAL DATA:  Weakness EXAM: PORTABLE CHEST 1 VIEW COMPARISON:  01/21/2020 FINDINGS: The heart size and mediastinal contours are within normal limits. Elevation of the left hemidiaphragm. No acute appearing airspace opacity. The visualized skeletal structures are unremarkable. IMPRESSION: Elevation of the left hemidiaphragm. No acute appearing airspace opacity in AP portable projection. Electronically Signed   By: Lauralyn Primes M.D.   On: 2021/01/24 19:02    EKG: Independently reviewed.  Multiple EKGs reviewed.  It showed complete heart block.  Currently  sinus rhythm in the latest EKG with a rate of 68.  No ST changes.  Assessment/Plan Principal Problem:   Heart block AV third degree (HCC) Active Problems:   Portal hypertension --- liver cirrhosis   Cirrhosis of liver with ascites and portal hypertension   Hypothyroidism   Hypokalemia   AKI (acute kidney injury) (HCC)  Hypomagnesemia   Hyponatremia   Hypercalcemia     #1 third-degree heart block: Probably as a result of multiple causes including coronary artery disease as well as hypothyroidism.  Also electrolyte abnormalities.  We will admit the patient on follow cardiology recommendations.  Aggressively replete electrolytes.  Monitor on telemetry.  #2  Hypokalemia: Replete potassium.  #3 hypomagnesemia: Replete magnesium  #4 hypercalcemia: Hydrate and follow calcium level.  Cause is not entirely clear.  Could be some occult malignancy.  May need to get some Zometa.  #5 AKI: Most likely prerenal.  Continue hydration  #6 hypothyroidism: Check free T4 and T3.  Continue levothyroxine.  #7 liver cirrhosis with portal hypertension: Chronic.  Continue close monitoring   DVT prophylaxis: SCD Code Status: Full Family Communication: Sister at bedside Disposition Plan: To be determined Consults called: Cardiology Admission status: Inpatient  Severity of Illness: The appropriate patient status for this patient is INPATIENT. Inpatient status is judged to be reasonable and necessary in order to provide the required intensity of service to ensure the patient's safety. The patient's presenting symptoms, physical exam findings, and initial radiographic and laboratory data in the context of their chronic comorbidities is felt to place them at high risk for further clinical deterioration. Furthermore, it is not anticipated that the patient will be medically stable for discharge from the hospital within 2 midnights of admission. The following factors support the patient status of inpatient.    " The patient's presenting symptoms include complete heart block. " The worrisome physical exam findings include cachexia and weakness. " The initial radiographic and laboratory data are worrisome because of O2 sats. " The chronic co-morbidities include liver cirrhosis.   * I certify that at the point of admission it is my clinical judgment that the patient will require inpatient hospital care spanning beyond 2 midnights from the point of admission due to high intensity of service, high risk for further deterioration and high frequency of surveillance required.Lonia Blood MD [facility] Pager [phone]  If 7PM-7AM, please contact night-coverage [email or web address] Password [email or web address]  12/26/2020, 1:00 AM

## 2020-12-25 NOTE — ED Provider Notes (Signed)
  Physical Exam  BP 134/77   Pulse 80   Temp 99.3 F (37.4 C) (Axillary)   Resp 19   SpO2 100%   Physical Exam  ED Course/Procedures     Procedures  MDM  Patient is transferred here from [facility].  Patient came with weakness and was intermittently in third-degree heart block.  Patient then went into torsades.  Patient was given magnesium and potassium.  Cardiology consulted and recommended transfer to [facility] in case she decompensates.  Patient on arrival is not hypotensive.  She is in sinus rhythm.  Reviewed her records and her potassium is 2.9.  She received 1 round already and I ordered 2 more.  Her calcium is 14 and is unclear why.  I discussed case with Dr. Hyacinth Meeker from cardiology.  He states that her arrhythmias is likely secondary to electrolyte abnormalities.  He states that once her electrolytes corrected and if she still goes into arrhythmias then cardiology will see the patient.  Otherwise she recommend medical management.         Charlynne Pander, MD 12/22/2020 2249

## 2020-12-25 NOTE — ED Notes (Signed)
ED Provider at bedside. 

## 2020-12-26 ENCOUNTER — Inpatient Hospital Stay (HOSPITAL_COMMUNITY): Payer: Medicare Other

## 2020-12-26 DIAGNOSIS — I442 Atrioventricular block, complete: Secondary | ICD-10-CM | POA: Diagnosis not present

## 2020-12-26 DIAGNOSIS — R188 Other ascites: Secondary | ICD-10-CM

## 2020-12-26 DIAGNOSIS — Z515 Encounter for palliative care: Secondary | ICD-10-CM

## 2020-12-26 DIAGNOSIS — E876 Hypokalemia: Secondary | ICD-10-CM

## 2020-12-26 DIAGNOSIS — R531 Weakness: Secondary | ICD-10-CM

## 2020-12-26 DIAGNOSIS — N179 Acute kidney failure, unspecified: Secondary | ICD-10-CM | POA: Diagnosis not present

## 2020-12-26 DIAGNOSIS — K7460 Unspecified cirrhosis of liver: Secondary | ICD-10-CM

## 2020-12-26 DIAGNOSIS — Z789 Other specified health status: Secondary | ICD-10-CM

## 2020-12-26 DIAGNOSIS — Z7189 Other specified counseling: Secondary | ICD-10-CM

## 2020-12-26 LAB — ECHOCARDIOGRAM COMPLETE
AR max vel: 2.44 cm2
AV Area VTI: 2.81 cm2
AV Area mean vel: 2.97 cm2
AV Mean grad: 6 mmHg
AV Peak grad: 13.4 mmHg
Ao pk vel: 1.83 m/s
Area-P 1/2: 3.27 cm2
Calc EF: 70.2 %
MV M vel: 5.43 m/s
MV Peak grad: 117.7 mmHg
MV VTI: 1.76 cm2
P 1/2 time: 443 msec
Radius: 0.3 cm
Single Plane A2C EF: 68.1 %
Single Plane A4C EF: 73.1 %

## 2020-12-26 LAB — COMPREHENSIVE METABOLIC PANEL
ALT: 10 U/L (ref 0–44)
AST: 21 U/L (ref 15–41)
Albumin: 2.2 g/dL — ABNORMAL LOW (ref 3.5–5.0)
Alkaline Phosphatase: 84 U/L (ref 38–126)
Anion gap: 10 (ref 5–15)
BUN: 19 mg/dL (ref 8–23)
CO2: 26 mmol/L (ref 22–32)
Calcium: 12.7 mg/dL — ABNORMAL HIGH (ref 8.9–10.3)
Chloride: 95 mmol/L — ABNORMAL LOW (ref 98–111)
Creatinine, Ser: 0.75 mg/dL (ref 0.44–1.00)
GFR, Estimated: >60 mL/min (ref >60)
Glucose, Bld: 79 mg/dL (ref 70–99)
Potassium: 3.6 mmol/L (ref 3.5–5.1)
Sodium: 131 mmol/L — ABNORMAL LOW (ref 135–145)
Total Bilirubin: 1.4 mg/dL — ABNORMAL HIGH (ref 0.3–1.2)
Total Protein: 5.7 g/dL — ABNORMAL LOW (ref 6.5–8.1)

## 2020-12-26 LAB — CBC
HCT: 30.4 % — ABNORMAL LOW (ref 36.0–46.0)
Hemoglobin: 9.6 g/dL — ABNORMAL LOW (ref 12.0–15.0)
MCH: 24.6 pg — ABNORMAL LOW (ref 26.0–34.0)
MCHC: 31.6 g/dL (ref 30.0–36.0)
MCV: 77.7 fL — ABNORMAL LOW (ref 80.0–100.0)
Platelets: 241 10*3/uL (ref 150–400)
RBC: 3.91 MIL/uL (ref 3.87–5.11)
RDW: 18.9 % — ABNORMAL HIGH (ref 11.5–15.5)
WBC: 9.1 10*3/uL (ref 4.0–10.5)
nRBC: 0 % (ref 0.0–0.2)

## 2020-12-26 LAB — T4, FREE: Free T4: 0.86 ng/dL (ref 0.61–1.12)

## 2020-12-26 LAB — VITAMIN B12: Vitamin B-12: >7500 pg/mL — ABNORMAL HIGH (ref 180–914)

## 2020-12-26 LAB — MAGNESIUM: Magnesium: 1.7 mg/dL (ref 1.7–2.4)

## 2020-12-26 LAB — PARATHYROID HORMONE, INTACT (NO CA): PTH: 7 pg/mL — ABNORMAL LOW (ref 15–65)

## 2020-12-26 LAB — MRSA PCR SCREENING: MRSA by PCR: NEGATIVE

## 2020-12-26 LAB — FOLATE: Folate: 5.2 ng/mL — ABNORMAL LOW (ref >5.9)

## 2020-12-26 LAB — VITAMIN D 25 HYDROXY (VIT D DEFICIENCY, FRACTURES): Vit D, 25-Hydroxy: 63.69 ng/mL (ref 30–100)

## 2020-12-26 MED ORDER — PREDNISOLONE ACETATE 1 % OP SUSP: 1.0000 [drp] | OPHTHALMIC | Status: DC

## 2020-12-26 MED ORDER — LEVOTHYROXINE SODIUM 25 MCG PO TABS: 25.0000 ug | ORAL_TABLET | Freq: Every day | ORAL | Status: DC

## 2020-12-26 MED ORDER — VITAMIN D 25 MCG (1000 UNIT) PO TABS: 2000.0000 [IU] | ORAL_TABLET | Freq: Every day | ORAL | Status: DC

## 2020-12-26 MED ORDER — ASCORBIC ACID 500 MG PO TABS
500.0000 mg | ORAL_TABLET | Freq: Every day | ORAL | Status: DC
  Filled 2020-12-26 (×2): qty 1

## 2020-12-26 MED ORDER — FERROUS SULFATE 325 (65 FE) MG PO TABS: 325.0000 mg | ORAL_TABLET | Freq: Two times a day (BID) | ORAL | Status: DC

## 2020-12-26 MED ORDER — POTASSIUM CHLORIDE 10 MEQ/100ML IV SOLN
10.0000 meq | Freq: Two times a day (BID) | INTRAVENOUS | Status: DC
  Administered 2020-12-26 (×2): 10 meq via INTRAVENOUS
  Filled 2020-12-26 (×2): qty 100

## 2020-12-26 MED ORDER — BACITRACIN-NEOMYCIN-POLYMYXIN OINTMENT TUBE: 1.0000 "application " | TOPICAL_OINTMENT | CUTANEOUS | Status: DC | PRN

## 2020-12-26 MED ORDER — PANTOPRAZOLE SODIUM 40 MG PO TBEC: 40.0000 mg | DELAYED_RELEASE_TABLET | Freq: Every day | ORAL | Status: DC

## 2020-12-26 MED ORDER — POTASSIUM CHLORIDE CRYS ER 20 MEQ PO TBCR: 40.0000 meq | EXTENDED_RELEASE_TABLET | Freq: Two times a day (BID) | ORAL | Status: DC

## 2020-12-26 MED ORDER — SIMETHICONE 80 MG PO CHEW: 80.0000 mg | CHEWABLE_TABLET | Freq: Four times a day (QID) | ORAL | Status: DC | PRN

## 2020-12-26 MED ORDER — NYSTATIN 100000 UNIT/ML MT SUSP: 5.0000 mL | Freq: Four times a day (QID) | OROMUCOSAL | Status: DC

## 2020-12-26 MED ORDER — ACETAMINOPHEN 650 MG RE SUPP: 650.0000 mg | Freq: Four times a day (QID) | RECTAL | Status: DC | PRN

## 2020-12-26 MED ORDER — FUROSEMIDE 20 MG PO TABS: 20.0000 mg | ORAL_TABLET | Freq: Every day | ORAL | Status: DC

## 2020-12-26 MED ORDER — ACETAMINOPHEN 325 MG PO TABS: 325.0000 mg | ORAL_TABLET | Freq: Four times a day (QID) | ORAL | Status: DC | PRN

## 2020-12-26 MED ORDER — THYROID 120 MG PO TABS
120.0000 mg | ORAL_TABLET | Freq: Every day | ORAL | Status: DC
  Filled 2020-12-26 (×3): qty 1

## 2020-12-26 MED ORDER — ACETAMINOPHEN 325 MG PO TABS: 650.0000 mg | ORAL_TABLET | Freq: Four times a day (QID) | ORAL | Status: DC | PRN

## 2020-12-26 MED ORDER — MUPIROCIN 2 % EX OINT
1.0000 "application " | TOPICAL_OINTMENT | Freq: Two times a day (BID) | CUTANEOUS | Status: DC
  Filled 2020-12-26: qty 22

## 2020-12-26 MED ORDER — SPIRONOLACTONE 25 MG PO TABS
50.0000 mg | ORAL_TABLET | Freq: Every day | ORAL | Status: DC
  Filled 2020-12-26: qty 2

## 2020-12-26 MED ORDER — SODIUM CHLORIDE 0.9 % IV SOLN: INTRAVENOUS | Status: DC

## 2020-12-26 MED ORDER — MAGNESIUM SULFATE 2 GM/50ML IV SOLN
2.0000 g | Freq: Once | INTRAVENOUS | Status: AC
  Administered 2020-12-26: 2 g via INTRAVENOUS
  Filled 2020-12-26: qty 50

## 2020-12-26 MED ORDER — LOPERAMIDE HCL 2 MG PO CAPS: 2.0000 mg | ORAL_CAPSULE | Freq: Two times a day (BID) | ORAL | Status: DC | PRN

## 2020-12-26 MED ORDER — SERTRALINE HCL 25 MG PO TABS
25.0000 mg | ORAL_TABLET | Freq: Every morning | ORAL | Status: DC
  Filled 2020-12-26: qty 1

## 2020-12-26 MED ORDER — NUTRISOURCE FIBER PO PACK
1.0000 | PACK | Freq: Every day | ORAL | Status: DC
  Filled 2020-12-26 (×2): qty 1

## 2020-12-26 MED ORDER — VITAMIN B-12 1000 MCG PO TABS: 1000.0000 ug | ORAL_TABLET | Freq: Every day | ORAL | Status: DC

## 2020-12-26 NOTE — ED Notes (Signed)
Pt was provided with gingerale per patient request

## 2020-12-26 NOTE — ED Notes (Signed)
A this time 2C states they are unable to take the report

## 2020-12-26 NOTE — Evaluation (Signed)
Clinical/Bedside Swallow Evaluation Patient Details  Name: [name] MRN: [id number] Date of Birth: 10-14-1933  Today's Date: 12/26/2020 Time: SLP Start Time (ACUTE ONLY): 1205 SLP Stop Time (ACUTE ONLY): 1238 SLP Time Calculation (min) (ACUTE ONLY): 33 min  Past Medical History:  Past Medical History:  Diagnosis Date  . CHF (congestive heart failure) (HCC)   . Cirrhosis (HCC)   . GERD (gastroesophageal reflux disease)   . GI bleed   . Hypertension   . Sjogren's disease (HCC)   . Thyroid disease    hypothyroidism   Past Surgical History:  Past Surgical History:  Procedure Laterality Date  . CATARACT EXTRACTION W/PHACO Right 10/23/2020   Procedure: CATARACT EXTRACTION PHACO AND INTRAOCULAR LENS PLACEMENT RIGHT EYE with PLACEMENT OF CORTICOSTERIOD;  Surgeon: Fabio Pierce, MD;  Location: [facility];  Service: Ophthalmology;  Laterality: Right;  EYC14.48  . COLONOSCOPY WITH ESOPHAGOGASTRODUODENOSCOPY (EGD) AND ESOPHAGEAL DILATION (ED)     years ago  . COLONOSCOPY WITH PROPOFOL N/A 08/30/2019   Procedure: COLONOSCOPY WITH PROPOFOL;  Surgeon: Malissa Hippo, MD;  [facility];  Service: Endoscopy;  Laterality: N/A;  . complete hysterectomy    . ESOPHAGOGASTRODUODENOSCOPY (EGD) WITH PROPOFOL N/A 08/30/2019   Procedure: ESOPHAGOGASTRODUODENOSCOPY (EGD) WITH PROPOFOL;  Surgeon: Malissa Hippo, MD;  Location: [facility];  Service: Endoscopy;  Laterality: N/A;  . left rotator cuff    . REMOVAL RETAINED LENS Right 11/30/2020   Procedure: REMOVAL RETAINED LENS FRAGMENT;  Surgeon: Fabio Pierce, MD;  Location: [facility];  Service: Ophthalmology;  Laterality: Right;   HPI:  87 year old female with history of coronary artery disease, Sjogren's syndrome, chronic diastolic heart failure, GERD, hypothyroidism brought to the ER with profound weakness for the last 2 to 3 weeks. At the emergency room, EKG was consistent with complete heart block. Per daughter, patient with mild  dysphagia at baseline due to dry mouth resulting from Sjogren's syndrome for which she compensates with soft solids. Apparently tolerating thin liquids well until the past few weeks in which she is demonstrating coughing with pos at home prior to admission. Hiatal hernia and stricture noted in 2003.   Assessment / Plan / Recommendation Clinical Impression  Patient presents with evidence of at least a moderate oropharyngeal dysphagia characterized by mulitple rapid swallows per small bolus of thin liquid, both via ice chips and straw which was preferred at home prior to arrival. Immediate aspiration episode noted, suspected prior to the swallow, with thin liquids via straw on second sip, taking patient in excess of 5 minutes to fully clear and cease coughing. Suspect a chronic dysphagia, now exacerbated by severe deconditioning. Discussed with daughter. Recommend NPO except ice chips after oral care if patient alert and positioned at 90 degrees, MBS with some improvement in overall strength to assess swallowing physiology and determine least restrictive diet. SLP Visit Diagnosis: Dysphagia, oropharyngeal phase (R13.12)    Aspiration Risk  Severe aspiration risk;Risk for inadequate nutrition/hydration    Diet Recommendation NPO;Ice chips PRN after oral care   Medication Administration: Via alternative means Supervision: Full supervision/cueing for compensatory strategies Compensations: Slow rate;Small sips/bites Postural Changes: Seated upright at 90 degrees    Other  Recommendations Oral Care Recommendations: Oral care QID;Oral care prior to ice chip/H20   Follow up Recommendations  (TBD)      Frequency and Duration min 2x/week  2 weeks       Prognosis Prognosis for Safe Diet Advancement: Fair Barriers to Reach Goals: Severity of deficits  Swallow Study   General HPI: 87 year old female with history of coronary artery disease, Sjogren's syndrome, chronic diastolic heart failure,  GERD, hypothyroidism brought to the ER with profound weakness for the last 2 to 3 weeks. At the emergency room, EKG was consistent with complete heart block. Per daughter, patient with mild dysphagia at baseline due to dry mouth resulting from Sjogren's syndrome for which she compensates with soft solids. Apparently tolerating thin liquids well until the past few weeks in which she is demonstrating coughing with pos at home prior to admission. Hiatal hernia and stricture noted in 2003. Type of Study: Bedside Swallow Evaluation Previous Swallow Assessment: see HPI Diet Prior to this Study: NPO Temperature Spikes Noted: No Respiratory Status: Nasal cannula History of Recent Intubation: No Behavior/Cognition: Alert Oral Cavity Assessment: Dry Oral Care Completed by SLP: Recent completion by staff Oral Cavity - Dentition: Missing dentition Vision: Functional for self-feeding Self-Feeding Abilities: Needs assist Patient Positioning: Upright in bed (neck hyperextended, positioned with pillows) Baseline Vocal Quality: Breathy;Low vocal intensity Volitional Cough: Weak Volitional Swallow: Unable to elicit    Oral/Motor/Sensory Function Overall Oral Motor/Sensory Function: Generalized oral weakness (dry mouth impacting lingual ROM)   Ice Chips Ice chips: Impaired Presentation: Spoon Pharyngeal Phase Impairments: Multiple swallows (audible swallow)   Thin Liquid Thin Liquid: Impaired Presentation: Straw (per request) Pharyngeal  Phase Impairments: Suspected delayed Swallow;Multiple swallows;Cough - Immediate (strong)    Nectar Thick Nectar Thick Liquid: Not tested   Honey Thick Honey Thick Liquid: Not tested   Puree Puree: Not tested   Solid     Solid: Not tested     Ferdinand Lango MA, CCC-SLP   Azarion Hove Meryl 12/26/2020,12:53 PM

## 2020-12-26 NOTE — Consult Note (Signed)
Consultation Note Date: 12/26/2020   Patient Name: [name]  DOB: 03/03/1933  MRN: [id number]  Age / Sex: 87 y.o., female  PCP: Sharilyn Sites, MD Referring Physician: Barb Merino, MD  Reason for Consultation: Establishing goals of care  HPI/Patient Profile: 87 y.o. female  with past medical history of Sjogren's disease, CHF, hypothyroidism, liver cirrhosis, and CAD presented to the [facility] ED from home with family concerns of dehydration and increased weakness. In ED she was found to have complete heart block with subsequent torsades. Patient was given magnesium and potassium, which were low. Following treatment she was evaluated by cardiology and transferred to [facility] ED to be observed closely in case arrythmia returned. Patient was admitted on 01/03/2021 with third degree heart block, hypokalemia, hypomagnesemia, hypercalcemia, and AKI.  ED Course: Temperature 99.3 blood pressure 150/90 pulse 129 initially respiratory 27 oxygen sats 80% on room air currently 100% on 2 L.  Sodium 128 potassium 2.9 chloride 84 CO2 28 glucose 95 BUN 22 creatinine 0.85 calcium 14.2 and a gap of 16.  White count 12.4 hemoglobin 11.8.  TSH of 5.417.  COVID-19 screen is negative.  Magnesium 1.4.  Urinalysis is negative.  Chest x-ray showed elevation of the left hemidiaphragm no acute airspace opacity.   Clinical Assessment and Goals of Care: I have reviewed medical records including EPIC notes, labs, and imaging. Received report from primary RN - no acute concerns. RN reports patient is "very weak" and has stated multiple times she wants to go home. Patient is currently NPO due to SLP evaluation which showed severe aspiration risk.   Went to visit patient at bedside - daughter/[name] and son/[name] were present. Patient was lying in bed awake, alert, oriented (confused at times), and able to minimally participate in conversation. No  signs or non-verbal gestures of pain or discomfort noted. Patient denies pain. No respiratory distress, increased work of breathing, or secretions noted. Patient does appear very weak and frail. Albumin noted to be 2.2 on December 26, 2020.  Met with patient, daughter/[name], and son/[name]  to discuss diagnosis, prognosis, GOC, EOL wishes, disposition, and options. Offered to call other daughter/HCPOA/[name] but family state she is unavailable at this time. They are agreeable for family meeting tomorrow but are open to PMT visit today to discuss [place].  I introduced Palliative Medicine as specialized medical care for people living with serious illness. It focuses on providing relief from the symptoms and stress of a serious illness. The goal is to improve quality of life for both the patient and the family.  We discussed a brief life review of the patient as well as functional and nutritional status. 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 Her husband unfortunately has passed away, but they had 3 children together - 2 daughters and 1 son. Prior to hospitalization, [name] lived in a private residence alone; however, [name] tells me she and [name] often spend the night and stay during the day as needed to care  for [name]. She was not able to prepare her own meals, but family would cook for her and she was able to heat up meals for herself. Family report noticing a decline 10 days ago when the patient began to "lose energy" and was not eating/drinking well. As of Tuesday/Wednesday of this week, family report the patient started needed the assistance of a walker to ambulate - which is different than her baseline of ambulating independently without assistance. Family note [name]'s decline as "quick" but feel they have seen signs of her slowing down for a while.   We discussed patient's current illness and what it means in the larger context of patient's on-going  co-morbidities. Family have a clear understanding of the patient's current medical situation.  Natural disease trajectory and expectations at EOL were discussed. I attempted to elicit values and goals of care important to the patient. The difference between aggressive medical intervention and comfort care was considered in light of the patient's goals of care. Reviewed information/recommendations from cardiology, SLP, and PT. Discussed that the biggest concern at this time is her inability to safely swallow/her severe aspiration risk. SLP told family they would be back tomorrow for re-evaluation and family are hopeful patient will show improvement. We discussed what next steps would be needed if patient remained severe aspiration risk. Explained and reviewed coretrak and PEG tube as being next steps for aggressive intervention options and discussed this in context of quality of life. Careful hand feeding with comfort measures was reviewed. Briefly discussed possible need for pacemaker - family would like to speak with EP to see if this is even an option before making final decisions. At this time, family's goals are to get input from EP and SLP reassessment and will make step-wise decisions from there. Family are open to continued [place] discussions.  Concepts specific to code status was reviewed. They are open for more detailed discussion during family meeting tomorrow. Continue full code today.  Discussed with patient/family the importance of continued conversation with each other and the medical providers regarding overall plan of care and treatment options, ensuring decisions are within the context of the patient's values and GOCs.    Questions and concerns were addressed. The patient/family was encouraged to call with questions and/or concerns. PMT card was provided.  Primary Decision Maker: HCPOA - daughter/[name]    SUMMARY OF RECOMMENDATIONS:  Continue current medical treatment with  watchful waiting  Continue full code status as previously documented  Family would like to speak with EP as well as get SLP re-evaluation results tomorrow and will make step-wise decisions with updated information  Patient is likely approaching EOL  Family meeting planned for tomorrow 3/13 - family to call PMT tomorrow and verify time. Hopefully can meet after getting updates as outlined above  Ongoing GOC discussions pending clinical course  PMT will continue to follow and support holistically  Code Status/Advance Care Planning:  Full code  Palliative Prophylaxis:   Aspiration, Bowel Regimen, Delirium Protocol, Frequent Pain Assessment, Oral Care and Turn Reposition  Additional Recommendations (Limitations, Scope, Preferences):  Full Scope Treatment  Psycho-social/Spiritual:   Desire for further Chaplaincy support:no Created space and opportunity for patient and family to express thoughts and feelings regarding patient's current medical situation.   Emotional support and therapeutic listening provided.  Prognosis:  Unable to determine  poor in the setting of advanced age and multiple comorbidities  Discharge Planning: To Be Determined      Primary Diagnoses: Present on  Admission: . Cirrhosis of liver with ascites and portal hypertension . Hypothyroidism . Portal hypertension --- liver cirrhosis . Hypokalemia . AKI (acute kidney injury) ([place]) . Hypomagnesemia . Hyponatremia . Heart block AV third degree ([place]) . Hypercalcemia   I have reviewed the medical record, interviewed the patient and family, and examined the patient. The following aspects are pertinent.  Past Medical History:  Diagnosis Date  . CHF (congestive heart failure) ([place])   . Cirrhosis ([place])   . GERD (gastroesophageal reflux disease)   . GI bleed   . Hypertension   . Sjogren's disease ([place])   . Thyroid disease    hypothyroidism   Social History   Socioeconomic History  . Marital  status: Widowed    Spouse name: Not on file  . Number of children: Not on file  . Years of education: Not on file  . Highest education level: Not on file  Occupational History  . Not on file  Tobacco Use  . Smoking status: Never Smoker  . Smokeless tobacco: Never Used  Substance and Sexual Activity  . Alcohol use: Never  . Drug use: Never  . Sexual activity: Not on file  Other Topics Concern  . Not on file  Social History Narrative   Pt lives by herself,    Social Determinants of Health   Financial Resource Strain: Not on file  Food Insecurity: Not on file  Transportation Needs: Not on file  Physical Activity: Not on file  Stress: Not on file  Social Connections: Not on file   Family History  Problem Relation Age of Onset  . Colon cancer Neg Hx   . Colon polyps Neg Hx    Scheduled Meds: . ascorbic acid  500 mg Oral Daily  . ferrous sulfate  325 mg Oral BID AC  . fiber  1 packet Oral Daily  . levothyroxine  25 mcg Oral Daily  . mupirocin ointment  1 application Topical BID  . pantoprazole  40 mg Oral Daily  . sertraline  25 mg Oral q morning  . spironolactone  50 mg Oral Daily  . thyroid  120 mg Oral QAC breakfast  . vitamin B-12  1,000 mcg Oral Daily   Continuous Infusions: . sodium chloride 125 mL/hr at 12/26/20 1336  . potassium chloride 10 mEq (12/26/20 1510)   PRN Meds:.acetaminophen **OR** acetaminophen, acetaminophen, loperamide, neomycin-bacitracin-polymyxin, simethicone Medications Prior to Admission:  Prior to Admission medications   Medication Sig Start Date End Date Taking? Authorizing Provider  acetaminophen (TYLENOL) 325 MG tablet Take 325 mg by mouth every 6 (six) hours as needed for moderate pain or headache.   Yes [provider]  Ascorbic Acid (VITAMIN C) 500 MG CAPS Take 500 mg by mouth daily.   Yes [provider]  Cholecalciferol (VITAMIN D) 50 MCG (2000 UT) tablet Take 2,000-4,000 Units by mouth daily.   Yes [provider]  esomeprazole (NEXIUM) 20 MG capsule Take 1 capsule (20 mg total) by mouth at bedtime. Patient taking differently: Take 20 mg by mouth daily. 09/01/19  Yes Kathie Dike, MD  Ferrous Sulfate (IRON) 325 (65 Fe) MG TABS Take 1 tablet (325 mg total) by mouth 2 (two) times daily before lunch and supper. Patient taking differently: Take 325 mg by mouth 2 (two) times daily before lunch and supper. 09/18/19  Yes Emokpae, Courage, MD  furosemide (LASIX) 20 MG tablet Take 1 tablet (20 mg total) by mouth daily. 09/16/20  Yes Harvel Quale, MD  levothyroxine (SYNTHROID) 25 MCG tablet Take 25 mcg by mouth daily. 12/22/20  Yes [provider]  loperamide (IMODIUM) 2 MG capsule Take 2 mg by mouth 2 (two) times daily as needed for diarrhea or loose stools.    Yes [provider]  Multiple Minerals (CALCIUM/MAGNESIUM/ZINC PO) Take 1 tablet by mouth daily.   Yes [provider]  mupirocin ointment (BACTROBAN) 2 % Apply 1 application topically 2 (two) times daily. 12/15/20  Yes [provider]  neomycin-bacitracin-polymyxin (NEOSPORIN) ointment Apply 1 application topically as needed for wound care.   Yes [provider]  sertraline (ZOLOFT) 25 MG tablet Take 25 mg by mouth every morning.    Yes [provider]  simethicone (MYLICON) 397 MG chewable tablet Chew 125 mg by mouth in the morning and at bedtime.   Yes [provider]  spironolactone (ALDACTONE) 50 MG tablet Take 1 tablet (50 mg total) by mouth daily. 09/16/20  Yes Harvel Quale, MD  thyroid (ARMOUR) 120 MG tablet Take 120 mg by mouth daily before breakfast.   Yes [provider]  vitamin B-12 (CYANOCOBALAMIN) 1000 MCG tablet Take 1,000 mcg by mouth daily.   Yes [provider]  Wheat Dextrin (BENEFIBER DRINK MIX PO) Take 1 packet by mouth daily. With breakfast   Yes [provider]  moxifloxacin (VIGAMOX) 0.5 % ophthalmic solution  Place 1 drop into the right eye 3 (three) times daily. Patient not taking: Reported on 01/14/2021    [provider]  nystatin (MYCOSTATIN) 100000 UNIT/ML suspension Take by mouth. Patient not taking: No sig reported 12/10/20   [provider]  prednisoLONE acetate (PRED FORTE) 1 % ophthalmic suspension Place 1 drop into the right eye See admin instructions. Instill 1 drop into operative eye 3 times daily starting 1 day after surgery Patient not taking: Reported on 01/02/2021 11/26/20   [provider]   Allergies  Allergen Reactions  . Ciprofloxacin Other (See Comments)    NO REACTION LISTED  . Asa [Aspirin] Other (See Comments)    Rectal bleeding  . Codeine Swelling    Cough syrup with codeine   . Milk-Related Compounds Diarrhea  . Penicillins     Unknown reaction Did it involve swelling of the face/tongue/throat, SOB, or low BP? Unknown Did it involve sudden or severe rash/hives, skin peeling, or any reaction on the inside of your mouth or nose? Unknown Did you need to seek medical attention at a hospital or doctor's office? Yes When did it last happen?Uknown If all above answers are "NO", may proceed with cephalosporin use. No issue with ceftriaxone  . Trazodone And Nefazodone Other (See Comments)    Confusion and hallucinations  . Demerol Palpitations  . Sulfa Antibiotics Rash   Review of Systems  Unable to perform ROS: Acuity of condition    Physical Exam Vitals and nursing note reviewed.  Constitutional:      General: She is not in acute distress.    Appearance: She is cachectic. She is ill-appearing.  Pulmonary:     Effort: No respiratory distress.  Skin:    General: Skin is warm and dry.  Neurological:     Mental Status: She is alert. She is confused.     Motor: Weakness present.  Psychiatric:        Attention and Perception: She is inattentive.        Behavior: Behavior is cooperative.        Cognition and Memory: Cognition and  memory  normal.     Comments: Confused at times     Vital Signs: BP (!) 154/66 (BP Location: Left Arm)   Pulse 73   Temp 98.2 F (36.8 C) (Axillary)   Resp (!) 28   SpO2 100%          SpO2: SpO2: 100 % O2 Device:SpO2: 100 % O2 Flow Rate: .O2 Flow Rate (L/min): 2 L/min  IO: Intake/output summary:   Intake/Output Summary (Last 24 hours) at 12/26/2020 1730 Last data filed at 12/26/2020 1000 Gross per 24 hour  Intake 1200 ml  Output -  Net 1200 ml    LBM:   Baseline Weight:   Most recent weight:       Palliative Assessment/Data: PPS 10% due to NPO status     Time In: 1735 Time Out: 1845 Time Total: 70 minutes  Greater than 50%  of this time was spent counseling and coordinating care related to the above assessment and plan.  Signed by: Lin Landsman, NP   Please contact Palliative Medicine Team phone at [phone] for questions and concerns.  For individual provider: See Shea Evans

## 2020-12-26 NOTE — Evaluation (Signed)
Physical Therapy Evaluation Patient Details Name: [name] MRN: [id number] DOB: December 03, 1932 Today's Date: 12/26/2020   History of Present Illness  pt is an 87 y/o female admitted 3/11 to ED with profound weakness for the last 2 to 3 weeks.  In the ED EKG was consistent with a complete heart block and found to have severe electrolyte abnormalities.  PMHx:  CHF, cirrhosis, HTN  Clinical Impression  Pt admitted with/for profound weakness in part due to complete hear block and also sever electrolyte imbalances.  Pt needing max assist for all basic mobility.   Pt currently limited functionally due to the problems listed below.  (see problems list.)  Pt will benefit from PT to maximize function and safety to be able to get home safely with available assist.     Follow Up Recommendations Home health PT;SNF;Other (comment) (pt's family discussing)    Equipment Recommendations  Other (comment);None recommended by PT (TBA based on pt's status and d/c)    Recommendations for Other Services       Precautions / Restrictions Precautions Precautions: Fall      Mobility  Bed Mobility Overal bed mobility: Needs Assistance Bed Mobility: Supine to Sit     Supine to sit: Max assist;HOB elevated     General bed mobility comments: cues for direction and to help initiate, LE assist and truncal assist up/stability until pt could get hands postioned to help minimally.    Transfers Overall transfer level: Needs assistance   Transfers: Sit to/from Stand;Stand Pivot Transfers Sit to Stand: Max assist Stand pivot transfers: Max assist       General transfer comment: face to face assist with pt hugging therapist to stand up to submaximal height and take slow, weak pivotal steps to chair with backing up.  Ambulation/Gait                Stairs            Wheelchair Mobility    Modified Rankin (Stroke Patients Only)       Balance Overall balance assessment: Needs  assistance Sitting-balance support: Single extremity supported;Bilateral upper extremity supported;Feet supported Sitting balance-Leahy Scale: Poor Sitting balance - Comments: listing posteriorly and to the right.     Standing balance-Leahy Scale: Poor Standing balance comment: reliant on external assist                             Pertinent Vitals/Pain Pain Assessment: No/denies pain    Home Living Family/patient expects to be discharged to:: Private residence Living Arrangements: Alone Available Help at Discharge: Family;Available PRN/intermittently;Available 24 hours/day;Other (Comment) (1 dtr (retired) comes ofter, but not all day, the other daughter and son help as able.) Type of Home: House Home Access: Stairs to enter     Home Layout: One level        Prior Function Level of Independence: Independent         Comments: ambulatory without AD, didn't do chores, but could reheat prepared meals and carry them to the table, completed her basic ADL's independently.  Dtr visiting/assisting from up to 1/2 day at times.     Hand Dominance        Extremity/Trunk Assessment   Upper Extremity Assessment Upper Extremity Assessment: Generalized weakness    Lower Extremity Assessment Lower Extremity Assessment: Generalized weakness       Communication   Communication:  (soft spoken on eval)  Cognition Arousal/Alertness: Awake/alert Behavior During  Therapy: WFL for tasks assessed/performed;Flat affect Overall Cognitive Status: Impaired/Different from baseline Area of Impairment: Orientation;Attention;Awareness;Problem solving                 Orientation Level: Situation;Time Current Attention Level: Sustained       Awareness: Intellectual Problem Solving: Slow processing;Requires verbal cues;Decreased initiation        General Comments General comments (skin integrity, edema, etc.): vital stable, but in/out of irregular rhythm.  SpO2 in the  mid to upper 90's on RA    Exercises Other Exercises Other Exercises: warm up bil hip/knee ROM prior to mobility   Assessment/Plan    PT Assessment Patient needs continued PT services  PT Problem List Decreased strength;Decreased activity tolerance;Decreased balance;Decreased mobility;Decreased coordination;Decreased cognition;Decreased knowledge of use of DME;Cardiopulmonary status limiting activity       PT Treatment Interventions DME instruction;Gait training;Functional mobility training;Therapeutic activities;Therapeutic exercise;Balance training;Patient/family education    PT Goals (Current goals can be found in the Care Plan section)  Acute Rehab PT Goals Patient Stated Goal: wants to get OOB,  Pt's family would love for her to be able to go back home. PT Goal Formulation: With patient Time For Goal Achievement: 01/09/21 Potential to Achieve Goals: Fair    Frequency Min 3X/week   Barriers to discharge        Co-evaluation               AM-PAC PT "6 Clicks" Mobility  Outcome Measure Help needed turning from your back to your side while in a flat bed without using bedrails?: A Lot Help needed moving from lying on your back to sitting on the side of a flat bed without using bedrails?: A Lot Help needed moving to and from a bed to a chair (including a wheelchair)?: A Lot Help needed standing up from a chair using your arms (e.g., wheelchair or bedside chair)?: A Lot Help needed to walk in hospital room?: Total Help needed climbing 3-5 steps with a railing? : Total 6 Click Score: 10    End of Session   Activity Tolerance: Patient limited by lethargy;Patient limited by fatigue;Patient tolerated treatment well Patient left: in chair;with call bell/phone within reach;with family/visitor present Nurse Communication: Mobility status PT Visit Diagnosis: Other abnormalities of gait and mobility (R26.89);Muscle weakness (generalized) (M62.81);Difficulty in walking, not  elsewhere classified (R26.2);Adult, failure to thrive (R62.7)    Time: 2355-7322 PT Time Calculation (min) (ACUTE ONLY): 36 min   Charges:   PT Evaluation $PT Eval Moderate Complexity: 1 Mod PT Treatments $Therapeutic Activity: 8-22 mins        12/26/2020  Jacinto Halim., PT Acute Rehabilitation Services [phone]  (pager) [phone]  (office)  [name] 12/26/2020, 3:36 PM

## 2020-12-26 NOTE — Progress Notes (Signed)
PM Progress Note    Pt  too restless to stay up in the chair due to safety and assist not readily available at this moment.  Worked on scooting to [place] of the chair, sit to stand and pivoting back toward the bed including backing up to and up toward the [facility].     12/26/20 1603  PT Visit Information  Last PT Received On 12/26/20  Assistance Needed +2  History of Present Illness pt is an 87 y/o female admitted 3/11 to ED with profound weakness for the last 2 to 3 weeks.  In the ED EKG was consistent with a complete heart block and found to have severe electrolyte abnormalities.  PMHx:  CHF, cirrhosis, HTN  Subjective Data  Patient Stated Goal wants to get OOB,  Pt's family would love for her to be able to go back home.  Precautions  Precautions Fall  Cognition  Arousal/Alertness Lethargic  Behavior During Therapy [facility] for tasks assessed/performed;Flat affect  Overall Cognitive Status Impaired/Different from baseline  Bed Mobility  Overal bed mobility Needs Assistance  Bed Mobility Sit to Supine  Sit to supine Max assist;+2 for physical assistance  General bed mobility comments cues and hand over hand assist to go down to L elbow and assist trunk and LE's into bed.  Assist to reposition.  Transfers  Overall transfer level Modified independent  Transfers Sit to/from [employer] Transfers  Sit to Stand Max assist  Stand pivot transfers Max assist;+2 safety/equipment  General transfer comment face to face assist with pt hugging therapist to stand up to submaximal height and take slow, weak pivotal steps to chair with backing up.  Balance  Sitting balance-Leahy Scale Poor  Standing balance-Leahy Scale Poor  Standing balance comment reliant on external assist  PT - End of Session  Activity Tolerance Patient limited by lethargy  Patient left in bed;with call bell/phone within reach;with bed alarm set;with family/visitor present  Nurse Communication Mobility status   PT -  Assessment/Plan  PT Plan Current plan remains appropriate  PT Visit Diagnosis Other abnormalities of gait and mobility (R26.89);Muscle weakness (generalized) (M62.81);Difficulty in walking, not elsewhere classified (R26.2);Adult, failure to thrive (R62.7)  PT Frequency (ACUTE ONLY) Min 3X/week  Follow Up Recommendations Home health PT;SNF;Other (comment)  PT equipment Other (comment);None recommended by PT  AM-PAC PT "6 Clicks" Mobility Outcome Measure (Version 2)  Help needed turning from your back to your side while in a flat bed without using bedrails? 2  Help needed moving from lying on your back to sitting on the side of a flat bed without using bedrails? 2  Help needed moving to and from a bed to a chair (including a wheelchair)? 2  Help needed standing up from a chair using your arms (e.g., wheelchair or bedside chair)? 2  Help needed to walk in hospital room? 1  Help needed climbing 3-5 steps with a railing?  1  6 Click Score 10  Consider Recommendation of Discharge To: CIR/SNF/LTACH  PT Goal Progression  Progress towards PT goals Progressing toward goals  Acute Rehab PT Goals  PT Goal Formulation With patient  Time For Goal Achievement 01/09/21  Potential to Achieve Goals Fair  PT Time Calculation  PT Start Time (ACUTE ONLY) 1551  PT Stop Time (ACUTE ONLY) 1604  PT Time Calculation (min) (ACUTE ONLY) 13 min  PT General Charges  $$ ACUTE PT VISIT 1 Visit  PT Treatments  $Therapeutic Activity 8-22 mins   12/26/2020  Jacinto Halim., PT  Acute Rehabilitation Services [phone]  (pager) [phone]  (office)

## 2020-12-26 NOTE — Consult Note (Signed)
Cardiology Consultation:   Patient ID: [name] MRN: [id number]; DOB: Nov 29, 1932  Admit date: 01/03/2021 Date of Consult: 12/26/2020  PCP:  Julie Found, MD   [facility]  Cardiologist:  No primary care provider on file.  Advanced Practice Provider:  No care team member to display Electrophysiologist:  None    Patient Profile:   [name] is a 87 y.o. female with a hx of CAD, sjogren, chronic diastolic HF, GERD, and hypothyroidism who is being seen today for the evaluation of complete heart block and torsades at the request of Dr. Jerral Ralph.  History of Present Illness:   [name] is a 87 year old female with history detailed above who presented with worsening weakness over the past 2-3 weeks with poor PO intake Peterson to have complete heart block on arrival to [facility] with subsequent torsades de pointes which resolved spontaneously. She was given potassium and magnesium at that time and subsequently transferred to [facility].  On arrival here, K 3.6, Mg 1.4-->1.7. Cr 0.75. WBC 9.1 and HgB 9.6. Initial ECG on 12/15/2020 at 1937 showed CHB with HR 37. ECG today with NSR, PACs and LBBB. She is currently HD stable with HR 70-80s. Blood pressure 130-150s. Tele with NSR, PACs, LBBB. She is frail appearing and not answering questions at this time. PO intake remains very poor and she has difficulty swallowing. Currently receiving medications through IV.   Past Medical History:  Diagnosis Date  . CHF (congestive heart failure) (HCC)   . Cirrhosis (HCC)   . GERD (gastroesophageal reflux disease)   . GI bleed   . Hypertension   . Sjogren's disease (HCC)   . Thyroid disease    hypothyroidism    Past Surgical History:  Procedure Laterality Date  . CATARACT EXTRACTION W/PHACO Right 10/23/2020   Procedure: CATARACT EXTRACTION PHACO AND INTRAOCULAR LENS PLACEMENT RIGHT EYE with PLACEMENT OF CORTICOSTERIOD;  Surgeon: Fabio Pierce, MD;   Location: [facility] ORS;  Service: Ophthalmology;  Laterality: Right;  [id number]  . COLONOSCOPY WITH ESOPHAGOGASTRODUODENOSCOPY (EGD) AND ESOPHAGEAL DILATION (ED)     years ago  . COLONOSCOPY WITH PROPOFOL N/A 08/30/2019   Procedure: COLONOSCOPY WITH PROPOFOL;  Surgeon: Malissa Hippo, MD;  Location: [facility] SUITE;  Service: Endoscopy;  Laterality: N/A;  . complete hysterectomy    . ESOPHAGOGASTRODUODENOSCOPY (EGD) WITH PROPOFOL N/A 08/30/2019   Procedure: ESOPHAGOGASTRODUODENOSCOPY (EGD) WITH PROPOFOL;  Surgeon: Malissa Hippo, MD;  Location: [facility];  Service: Endoscopy;  Laterality: N/A;  . left rotator cuff    . REMOVAL RETAINED LENS Right 11/30/2020   Procedure: REMOVAL RETAINED LENS FRAGMENT;  Surgeon: Fabio Pierce, MD;  Location: [facility];  Service: Ophthalmology;  Laterality: Right;     Home Medications:  Prior to Admission medications   Medication Sig Start Date End Date Taking? Authorizing Provider  acetaminophen (TYLENOL) 325 MG tablet Take 325 mg by mouth every 6 (six) hours as needed for moderate pain or headache.   Yes [provider]  Ascorbic Acid (VITAMIN C) 500 MG CAPS Take 500 mg by mouth daily.   Yes [provider]  Cholecalciferol (VITAMIN D) 50 MCG (2000 UT) tablet Take 2,000-4,000 Units by mouth daily.   Yes [provider]  esomeprazole (NEXIUM) 20 MG capsule Take 1 capsule (20 mg total) by mouth at bedtime. Patient taking differently: Take 20 mg by mouth daily. 09/01/19  Yes Erick Blinks, MD  Ferrous Sulfate (IRON) 325 (65 Fe) MG TABS  Take 1 tablet (325 mg total) by mouth 2 (two) times daily before lunch and supper. Patient taking differently: Take 325 mg by mouth 2 (two) times daily before lunch and supper. 09/18/19  Yes Emokpae, Courage, MD  furosemide (LASIX) 20 MG tablet Take 1 tablet (20 mg total) by mouth daily. 09/16/20  Yes Dolores Frame, MD  levothyroxine (SYNTHROID) 25 MCG tablet Take 25 mcg by mouth daily. 12/22/20   Yes [provider]  loperamide (IMODIUM) 2 MG capsule Take 2 mg by mouth 2 (two) times daily as needed for diarrhea or loose stools.    Yes [provider]  Multiple Minerals (CALCIUM/MAGNESIUM/ZINC PO) Take 1 tablet by mouth daily.   Yes [provider]  mupirocin ointment (BACTROBAN) 2 % Apply 1 application topically 2 (two) times daily. 12/15/20  Yes [provider]  neomycin-bacitracin-polymyxin (NEOSPORIN) ointment Apply 1 application topically as needed for wound care.   Yes [provider]  sertraline (ZOLOFT) 25 MG tablet Take 25 mg by mouth every morning.    Yes [provider]  simethicone (MYLICON) 125 MG chewable tablet Chew 125 mg by mouth in the morning and at bedtime.   Yes [provider]  spironolactone (ALDACTONE) 50 MG tablet Take 1 tablet (50 mg total) by mouth daily. 09/16/20  Yes Dolores Frame, MD  thyroid (ARMOUR) 120 MG tablet Take 120 mg by mouth daily before breakfast.   Yes [provider]  vitamin B-12 (CYANOCOBALAMIN) 1000 MCG tablet Take 1,000 mcg by mouth daily.   Yes [provider]  Wheat Dextrin (BENEFIBER DRINK MIX PO) Take 1 packet by mouth daily. With breakfast   Yes [provider]  moxifloxacin (VIGAMOX) 0.5 % ophthalmic solution Place 1 drop into the right eye 3 (three) times daily. Patient not taking: Reported on 01-17-21    [provider]  nystatin (MYCOSTATIN) 100000 UNIT/ML suspension Take by mouth. Patient not taking: No sig reported 12/10/20   [provider]  prednisoLONE acetate (PRED FORTE) 1 % ophthalmic suspension Place 1 drop into the right eye See admin instructions. Instill 1 drop into operative eye 3 times daily starting 1 day after surgery Patient not taking: Reported on 01-17-2021 11/26/20   [provider]    Inpatient Medications: Scheduled Meds: . ascorbic acid  500 mg Oral Daily  . ferrous sulfate  325 mg Oral  BID AC  . fiber  1 packet Oral Daily  . levothyroxine  25 mcg Oral Daily  . mupirocin ointment  1 application Topical BID  . pantoprazole  40 mg Oral Daily  . sertraline  25 mg Oral q morning  . spironolactone  50 mg Oral Daily  . thyroid  120 mg Oral QAC breakfast  . vitamin B-12  1,000 mcg Oral Daily   Continuous Infusions: . sodium chloride 125 mL/hr at 12/26/20 0956  . potassium chloride     PRN Meds: acetaminophen **OR** acetaminophen, acetaminophen, loperamide, neomycin-bacitracin-polymyxin, simethicone  Allergies:    Allergies  Allergen Reactions  . Ciprofloxacin Other (See Comments)    NO REACTION LISTED  . Asa [Aspirin] Other (See Comments)    Rectal bleeding  . Codeine Swelling    Cough syrup with codeine   . Milk-Related Compounds Diarrhea  . Penicillins     Unknown reaction Did it involve swelling of the face/tongue/throat, SOB, or low BP? Unknown Did it involve sudden or severe rash/hives, skin peeling, or any reaction on the inside of your mouth or nose?  Unknown Did you need to seek medical attention at a hospital or doctor's office? Yes When did it last happen?Uknown If all above answers are "NO", may proceed with cephalosporin use. No issue with ceftriaxone  . Trazodone And Nefazodone Other (See Comments)    Confusion and hallucinations  . Demerol Palpitations  . Sulfa Antibiotics Rash    Social History:   Social History   Socioeconomic History  . Marital status: Widowed    Spouse name: Not on file  . Number of children: Not on file  . Years of education: Not on file  . Highest education level: Not on file  Occupational History  . Not on file  Tobacco Use  . Smoking status: Never Smoker  . Smokeless tobacco: Never Used  Substance and Sexual Activity  . Alcohol use: Never  . Drug use: Never  . Sexual activity: Not on file  Other Topics Concern  . Not on file  Social History Narrative   Pt lives by herself,    Social Determinants of  Health   Financial Resource Strain: Not on file  Food Insecurity: Not on file  Transportation Needs: Not on file  Physical Activity: Not on file  Stress: Not on file  Social Connections: Not on file  Intimate Partner Violence: Not on file    Family History:    Family History  Problem Relation Age of Onset  . Colon cancer Neg Hx   . Colon polyps Neg Hx      ROS:  Please see the history of present illness.  Review of Systems  Constitutional: Positive for malaise/fatigue and weight loss. Negative for fever.  HENT: Negative for congestion.   Eyes: Negative for blurred vision.  Respiratory: Positive for shortness of breath.   Cardiovascular: Negative for chest pain, palpitations, orthopnea, claudication, leg swelling and PND.  Gastrointestinal: Negative for nausea and vomiting.  Genitourinary: Negative for hematuria.  Musculoskeletal: Positive for joint pain.  Neurological: Positive for weakness.  Endo/Heme/Allergies: Negative for polydipsia.  Psychiatric/Behavioral: Positive for memory loss.      Physical Exam/Data:   Vitals:   12/26/20 0600 12/26/20 0700 12/26/20 0800 12/26/20 1100  BP: 135/85 (!) 148/59 (!) 157/69 (!) 131/95  Pulse: 73 68  83  Resp: (!) 21 (!) 21 (!) 28 (!) 22  Temp:  97.8 F (36.6 C)  98.2 F (36.8 C)  TempSrc:  Oral  Axillary  SpO2: 100% 100%  100%    Intake/Output Summary (Last 24 hours) at 12/26/2020 1205 Last data filed at 12/26/2020 1000 Gross per 24 hour  Intake 1200 ml  Output --  Net 1200 ml   Last 3 Weights 11/30/2020 11/26/2020 05/18/2020  Weight (lbs) 83 lb 1.5 oz 83 lb 88 lb  Weight (kg) 37.69 kg 37.649 kg 39.917 kg     There is no height or weight on file to calculate BMI.  General:  Very frail appearing, elderly female, awake but not answering questions HEENT: normal Lymph: no adenopathy Neck: no JVD Endocrine:  No thryomegaly Vascular: No carotid bruits; FA pulses 2+ bilaterally without bruits  Cardiac:  normal S1, S2; RRR;  2/6 systolic murmur Lungs:  clear to auscultation bilaterally, no wheezing, rhonchi or rales  Abd: soft, nontender, no hepatomegaly  Ext: no edema. Very thin Musculoskeletal:  No deformities, BUE and BLE strength normal and equal Skin: warm and dry  Neuro:  Confused Psych:  Unable to assess  EKG:  The EKG was personally reviewed and demonstrates:  NSR, PAC, LBBB  Telemetry:  Telemetry was personally reviewed and demonstrates:  NSR with LBBB, PACs  Relevant CV Studies: TTE 08/28/2019: IMPRESSIONS  1. Left ventricular ejection fraction, by visual estimation, is 50 to  55%. The left ventricle has normal function. There is moderately increased  left ventricular hypertrophy.  2. Elevated left atrial pressure.  3. Left ventricular diastolic parameters are consistent with Grade II  diastolic dysfunction (pseudonormalization).  4. Global right ventricle has normal systolic function.The right  ventricular size is mildly enlarged. No increase in right ventricular wall  thickness.  5. Left atrial size was severely dilated.  6. Right atrial size was moderately dilated.  7. Small pericardial effusion.  8. The pericardial effusion is circumferential.  9. Moderate aortic valve annular calcification.  10. Moderate calcification of the mitral valve leaflet(s).  11. Moderate mitral annular calcification.  12. Moderate thickening of the mitral valve leaflet(s).  13. The mitral valve is abnormal. Moderate mitral valve regurgitation. No  evidence of mitral stenosis.  14. The tricuspid valve is normal in structure. Tricuspid valve  regurgitation moderate.  15. Aortic valve regurgitation is mild to moderate.  16. The aortic valve is tricuspid. Aortic valve regurgitation is mild to  moderate. No evidence of aortic valve sclerosis or stenosis.  17. There is Moderate sclerosis of the aortic valve.  18. There is Moderate thickening of the aortic valve.  19. The pulmonic valve was not well  visualized. Pulmonic valve  regurgitation is not visualized.  20. Moderately elevated pulmonary artery systolic pressure.  21. There is moderate pulmonary HTN, PASP is 53 mmhg.  22. The inferior vena cava is normal in size with greater than 50%  respiratory variability, suggesting right atrial pressure of 3 mmHg.   Laboratory Data:  High Sensitivity Troponin:  No results for input(s): TROPONINIHS in the last 720 hours.   Chemistry Recent Labs  Lab 01/01/2021 1457 12/26/20 0201  NA 128* 131*  K 2.9* 3.6  CL 84* 95*  CO2 28 26  GLUCOSE 95 79  BUN 22 19  CREATININE 0.85 0.75  CALCIUM 14.2* 12.7*  GFRNONAA >60 >60  ANIONGAP 16* 10    Recent Labs  Lab 12/22/2020 1457 12/26/20 0201  PROT 7.3 5.7*  ALBUMIN 2.9* 2.2*  AST 23 21  ALT 11 10  ALKPHOS 119 84  BILITOT 0.9 1.4*   Hematology Recent Labs  Lab 01/11/2021 1457 12/26/20 0201  WBC 12.4* 9.1  RBC 4.86 3.91  HGB 11.8* 9.6*  HCT 38.4 30.4*  MCV 79.0* 77.7*  MCH 24.3* 24.6*  MCHC 30.7 31.6  RDW 19.1* 18.9*  PLT 282 241   BNPNo results for input(s): BNP, PROBNP in the last 168 hours.  DDimer No results for input(s): DDIMER in the last 168 hours.   Radiology/Studies:  DG Chest Port 1 View  Result Date: 12/27/2020 CLINICAL DATA:  Weakness EXAM: PORTABLE CHEST 1 VIEW COMPARISON:  01/21/2020 FINDINGS: The heart size and mediastinal contours are within normal limits. Elevation of the left hemidiaphragm. No acute appearing airspace opacity. The visualized skeletal structures are unremarkable. IMPRESSION: Elevation of the left hemidiaphragm. No acute appearing airspace opacity in AP portable projection. Electronically Signed   By: Lauralyn Primes M.D.   On: 12/30/2020 19:02     Assessment and Plan:   #Complete Heart Block  #Torsades de Pointes: Patient presented with worsening weakness, fatigue and poor PO intake Peterson to be in complete heart block at [facility]. While awaiting admisison there, she developed  torsades (strip in  ER note) that converted spontaneously. Her K and Mg were notably low and replaced. She was subsequently transferred [facility] where she is now in NSR with LBBB with HR 70-80s. No further episodes of torsades/VT. HD stable. Frail appearing and confused currently. Per the daughter, she previously lived alone, was ambulatory and could heat up meals on her own. Has declined rapidly with rapid weight loss and increased weakness. It is likely she has high degree AV block, however, given her overall frailty, she may not want to/or be eligible for intervention at this time. Will have EP evaluate and family with discuss further as well. Palliative care to see too. -Given overall frailty and decline, patient may not want or be eligible for PPM -Will have EP evaluate as well -Agree with palliative care consult -Continue aggressive repletion of K and Mg; unable to tolerate PO so will need IV -Check TTE -Avoid nodal agents -Pads in place -Continue tele  #Diastolic Heart Failure: Last TTE with LVEF 50-55%, G2DD, moderate MR. On lasix and spironolactone for diuretics at home but appears dry with very poor PO intake in the setting of sjogren's disease. -Hold diuretics -Repeat TTE -No nodal agents or antihypertensives given CHB and torsades as above -Agree with palliative consult  #Sjogren's syndrome #Severe protein calorie malnutrition #Fraility: -Agree with palliative care consult    Risk Assessment/Risk Scores:        New York Heart Association (NYHA) Functional Class NYHA Class II-III (not overloaded)        For questions or updates, please contact [facility] Please consult [email or web address] for contact info under    Signed, Meriam SpragueHeather E Benjamen Koelling, MD  12/26/2020 12:05 PM

## 2020-12-26 NOTE — Progress Notes (Signed)
PROGRESS NOTE    [name]  XTG:[id number] DOB: [birth year] DOA: 12/18/2020 PCP: Assunta Found, MD    Brief Narrative:  87 year old female with history of coronary artery disease, Sjogren's syndrome, chronic diastolic heart failure, GERD, hypothyroidism brought to the ER with profound weakness for the last 2 to 3 weeks.  Patient has significant Sjogren syndrome, she is mostly dehydrated and also taking diuretics with recent bouts of diastolic CHF.  She lives at home and her daughter [name] helps her around almost half of the day.  Patient also with poor appetite and poor quality of life for about 6 months now. At the emergency room, EKG was consistent with complete heart block, potassium 2.9, sodium 128, calcium 14.2, magnesium 1.4.  TSH 5.4.  COVID-19 negative.  She was transferred ER to ER with temporary pacer, subsequently converted to sinus rhythm with left bundle branch block.   Assessment & Plan:   Principal Problem:   Heart block AV third degree (HCC) Active Problems:   Portal hypertension --- liver cirrhosis   Cirrhosis of liver with ascites and portal hypertension   Hypothyroidism   Hypokalemia   AKI (acute kidney injury) (HCC)   Hypomagnesemia   Hyponatremia   Hypercalcemia  Third-degree heart block: Not on any rate control medications.  Probably degenerative disease along with severe electrolyte abnormalities.  Currently stabilizing. New EKG with left bundle branch block, sinus rhythm. Aggressively replace electrolytes.  Repeat echocardiogram today. Not sure patient is a candidate for pacemaker given advanced debility and frailty, will consult cardiology for their recommendations.  Hypovolemic hyponatremia, hypokalemia, hypomagnesemia: Received 2 L isotonic fluid, keep on maintenance fluid also to correct calcium. Replace aggressively potassium, will keep more than 4.  Replace magnesium aggressively, will keep more than 2.  Discontinue Lasix.  Hypercalcemia:  Presented with total calcium 14.2.  Previous calcium levels were normal.  Patient also on scheduled vitamin D 2g every day at home. Check PTH and vitamin D3 levels.  Will check ionized calcium.  Already normalizing with isotonic fluid. Severely dehydrated.  Hypothyroidism: TSH is normal level.  Resume home thyroxine.  Physical debility/frailty: Patient has underlying Sjogren's syndrome and multiple other comorbidities, she has been gradually losing weight for the last 6 to 9 months.  Does not eat well at home. We will check vitamin levels including B12, folic acid and replace if low. Will consult palliative care team to start goal of care discussion as patient has progressive debility and frailty.    DVT prophylaxis: SCDs Start: 12/26/20 0125   Code Status: Full code Family Communication: Patient's daughter at on the phone Disposition Plan: Status is: Inpatient  Remains inpatient appropriate because:Persistent severe electrolyte disturbances, IV treatments appropriate due to intensity of illness or inability to take PO and Inpatient level of care appropriate due to severity of illness   Dispo: The patient is from: Home              Anticipated d/c is to: Home              Patient currently is not medically stable to d/c.   Difficult to place patient No         Consultants:   Cardiology  Procedures:   None  Antimicrobials:   None   Subjective: Patient seen and examined.  Her voice is muffled because of dry mouth and difficult to understand.  She denied any complaints at this moment.  She is asking me to call her daughter so she can go  home. Patient tells me that she has something wrong with her heart but not sure what it is.  Does not have good appetite. Telemetry shows sinus rhythm, bundle branch block patterns.  Objective: Vitals:   12/26/20 0400 12/26/20 0415 12/26/20 0600 12/26/20 0700  BP: (!) 163/85 139/65 135/85 (!) 148/59  Pulse: 84 75 73 68  Resp: (!)  28 (!) 24 (!) 21 (!) 21  Temp:  99.8 F (37.7 C)  97.8 F (36.6 C)  TempSrc:  Oral  Oral  SpO2: 100% 100% 100% 100%    Intake/Output Summary (Last 24 hours) at 12/26/2020 [id number] Last data filed at Feb 22, 2021 2149 Gross per 24 hour  Intake 1150 ml  Output --  Net 1150 ml   There were no vitals filed for this visit.  Examination:  General exam: Appears chronically ill, frail debilitated and cachectic.  On 2 L oxygen.  Not in any distress at this time. Respiratory system: Clear to auscultation. Respiratory effort normal.  No added sounds. Cardiovascular system: S1 & S2 heard, RRR. Gastrointestinal system: Soft and nontender. Central nervous system: Alert and oriented. No focal neurological deficits.  Generalized weakness. Extremities: Symmetric 5 x 5 power.  Generalized weakness. Skin: No rashes, lesions or ulcers Psychiatry: Mood & affect flat and anxious.    Data Reviewed: I have personally reviewed following labs and imaging studies  CBC: Recent Labs  Lab Oct 21, 2020 1457 12/26/20 0201  WBC 12.4* 9.1  HGB 11.8* 9.6*  HCT 38.4 30.4*  MCV 79.0* 77.7*  PLT 282 241   Basic Metabolic Panel: Recent Labs  Lab Oct 21, 2020 1457 Oct 21, 2020 2126 12/26/20 0201  NA 128*  --  131*  K 2.9*  --  3.6  CL 84*  --  95*  CO2 28  --  26  GLUCOSE 95  --  79  BUN 22  --  19  CREATININE 0.85  --  0.75  CALCIUM 14.2*  --  12.7*  MG  --  1.4* 1.7   GFR: CrCl cannot be calculated (Unknown ideal weight.). Liver Function Tests: Recent Labs  Lab Oct 21, 2020 1457 12/26/20 0201  AST 23 21  ALT 11 10  ALKPHOS 119 84  BILITOT 0.9 1.4*  PROT 7.3 5.7*  ALBUMIN 2.9* 2.2*   Recent Labs  Lab Oct 21, 2020 1457  LIPASE 24   Recent Labs  Lab Oct 21, 2020 1852  AMMONIA 14   Coagulation Profile: No results for input(s): INR, PROTIME in the last 168 hours. Cardiac Enzymes: No results for input(s): CKTOTAL, CKMB, CKMBINDEX, TROPONINI in the last 168 hours. BNP (last 3 results) No results for  input(s): PROBNP in the last 8760 hours. HbA1C: No results for input(s): HGBA1C in the last 72 hours. CBG: No results for input(s): GLUCAP in the last 168 hours. Lipid Profile: No results for input(s): CHOL, HDL, LDLCALC, TRIG, CHOLHDL, LDLDIRECT in the last 72 hours. Thyroid Function Tests: Recent Labs    Oct 21, 2020 1852 12/26/20 0201  TSH 5.417*  --   FREET4  --  0.86   Anemia Panel: No results for input(s): VITAMINB12, FOLATE, FERRITIN, TIBC, IRON, RETICCTPCT in the last 72 hours. Sepsis Labs: No results for input(s): PROCALCITON, LATICACIDVEN in the last 168 hours.  Recent Results (from the past 240 hour(s))  Resp Panel by RT-PCR (Flu A&B, Covid) Nasopharyngeal Swab     Status: None   Collection Time: Oct 21, 2020  6:30 PM   Specimen: Nasopharyngeal Swab; Nasopharyngeal(NP) swabs in vial transport medium  Result Value Ref Range Status  SARS Coronavirus 2 by RT PCR NEGATIVE NEGATIVE Final    Comment: (NOTE) SARS-CoV-2 target nucleic acids are NOT DETECTED.  The SARS-CoV-2 RNA is generally detectable in upper respiratory specimens during the acute phase of infection. The lowest concentration of SARS-CoV-2 viral copies this assay can detect is 138 copies/mL. A negative result does not preclude SARS-Cov-2 infection and should not be used as the sole basis for treatment or other patient management decisions. A negative result may occur with  improper specimen collection/handling, submission of specimen other than nasopharyngeal swab, presence of viral mutation(s) within the areas targeted by this assay, and inadequate number of viral copies(<138 copies/mL). A negative result must be combined with clinical observations, patient history, and epidemiological information. The expected result is Negative.  Fact Sheet for Patients:  BloggerCourse.com  Fact Sheet for Healthcare Providers:  SeriousBroker.it  This test is no t yet  approved or cleared by the [place] FDA and  has been authorized for detection and/or diagnosis of SARS-CoV-2 by FDA under an Emergency Use Authorization (EUA). This EUA will remain  in effect (meaning this test can be used) for the duration of the COVID-19 declaration under Section 564(b)(1) of the Act, 21 U.S.C.section 360bbb-3(b)(1), unless the authorization is terminated  or revoked sooner.       Influenza A by PCR NEGATIVE NEGATIVE Final   Influenza B by PCR NEGATIVE NEGATIVE Final    Comment: (NOTE) The Xpert Xpress SARS-CoV-2/FLU/RSV plus assay is intended as an aid in the diagnosis of influenza from Nasopharyngeal swab specimens and should not be used as a sole basis for treatment. Nasal washings and aspirates are unacceptable for Xpert Xpress SARS-CoV-2/FLU/RSV testing.  Fact Sheet for Patients: BloggerCourse.com  Fact Sheet for Healthcare Providers: SeriousBroker.it  This test is not yet approved or cleared by the [place] FDA and has been authorized for detection and/or diagnosis of SARS-CoV-2 by FDA under an Emergency Use Authorization (EUA). This EUA will remain in effect (meaning this test can be used) for the duration of the COVID-19 declaration under Section 564(b)(1) of the Act, 21 U.S.C. section 360bbb-3(b)(1), unless the authorization is terminated or revoked.  Performed at [facility], [address], [place], [place] [address]          Radiology Studies: [facility] Chest [facility] 1 View  Result Date: 12/30/2020 CLINICAL DATA:  Weakness EXAM: PORTABLE CHEST 1 VIEW COMPARISON:  01/21/2020 FINDINGS: The heart size and mediastinal contours are within normal limits. Elevation of the left hemidiaphragm. No acute appearing airspace opacity. The visualized skeletal structures are unremarkable. IMPRESSION: Elevation of the left hemidiaphragm. No acute appearing airspace opacity in AP portable projection.  Electronically Signed   By: Lauralyn Primes M.D.   On: 30-Dec-2020 19:02        Scheduled Meds: . ascorbic acid  500 mg Oral Daily  . ferrous sulfate  325 mg Oral BID AC  . fiber  1 packet Oral Daily  . furosemide  20 mg Oral Daily  . levothyroxine  25 mcg Oral Daily  . mupirocin ointment  1 application Topical BID  . pantoprazole  40 mg Oral Daily  . potassium chloride  40 mEq Oral BID  . sertraline  25 mg Oral q morning  . spironolactone  50 mg Oral Daily  . thyroid  120 mg Oral QAC breakfast  . vitamin B-12  1,000 mcg Oral Daily   Continuous Infusions: . sodium chloride    . magnesium sulfate bolus IVPB 2 g (12/26/20 0755)  LOS: 1 day    Time spent: 35 minutes    Dorcas Carrow, MD [facility] Pager [phone]

## 2020-12-26 NOTE — Progress Notes (Signed)
ER attempted report it was not taken due to room not yet cleaned and ready.

## 2020-12-27 DIAGNOSIS — R531 Weakness: Secondary | ICD-10-CM | POA: Diagnosis not present

## 2020-12-27 DIAGNOSIS — K766 Portal hypertension: Secondary | ICD-10-CM

## 2020-12-27 DIAGNOSIS — I5030 Unspecified diastolic (congestive) heart failure: Secondary | ICD-10-CM | POA: Diagnosis not present

## 2020-12-27 DIAGNOSIS — I442 Atrioventricular block, complete: Secondary | ICD-10-CM | POA: Diagnosis not present

## 2020-12-27 DIAGNOSIS — R0602 Shortness of breath: Secondary | ICD-10-CM

## 2020-12-27 DIAGNOSIS — Z515 Encounter for palliative care: Secondary | ICD-10-CM

## 2020-12-27 DIAGNOSIS — E871 Hypo-osmolality and hyponatremia: Secondary | ICD-10-CM

## 2020-12-27 DIAGNOSIS — K7460 Unspecified cirrhosis of liver: Secondary | ICD-10-CM | POA: Diagnosis not present

## 2020-12-27 DIAGNOSIS — Z66 Do not resuscitate: Secondary | ICD-10-CM

## 2020-12-27 DIAGNOSIS — N179 Acute kidney failure, unspecified: Secondary | ICD-10-CM | POA: Diagnosis not present

## 2020-12-27 LAB — CBC WITH DIFFERENTIAL/PLATELET
Abs Immature Granulocytes: 0.1 10*3/uL — ABNORMAL HIGH (ref 0.00–0.07)
Basophils Absolute: 0 10*3/uL (ref 0.0–0.1)
Basophils Relative: 0 %
Eosinophils Absolute: 0 10*3/uL (ref 0.0–0.5)
Eosinophils Relative: 0 %
HCT: 30.5 % — ABNORMAL LOW (ref 36.0–46.0)
Hemoglobin: 9.3 g/dL — ABNORMAL LOW (ref 12.0–15.0)
Immature Granulocytes: 1 %
Lymphocytes Relative: 4 %
Lymphs Abs: 0.4 10*3/uL — ABNORMAL LOW (ref 0.7–4.0)
MCH: 24.2 pg — ABNORMAL LOW (ref 26.0–34.0)
MCHC: 30.5 g/dL (ref 30.0–36.0)
MCV: 79.2 fL — ABNORMAL LOW (ref 80.0–100.0)
Monocytes Absolute: 1.1 10*3/uL — ABNORMAL HIGH (ref 0.1–1.0)
Monocytes Relative: 10 %
Neutro Abs: 9.1 10*3/uL — ABNORMAL HIGH (ref 1.7–7.7)
Neutrophils Relative %: 85 %
Platelets: 224 10*3/uL (ref 150–400)
RBC: 3.85 MIL/uL — ABNORMAL LOW (ref 3.87–5.11)
RDW: 18.8 % — ABNORMAL HIGH (ref 11.5–15.5)
WBC: 10.7 10*3/uL — ABNORMAL HIGH (ref 4.0–10.5)
nRBC: 0 % (ref 0.0–0.2)

## 2020-12-27 LAB — COMPREHENSIVE METABOLIC PANEL
ALT: 13 U/L (ref 0–44)
AST: 26 U/L (ref 15–41)
Albumin: 2.2 g/dL — ABNORMAL LOW (ref 3.5–5.0)
Alkaline Phosphatase: 84 U/L (ref 38–126)
Anion gap: 11 (ref 5–15)
BUN: 23 mg/dL (ref 8–23)
CO2: 23 mmol/L (ref 22–32)
Calcium: 12.7 mg/dL — ABNORMAL HIGH (ref 8.9–10.3)
Chloride: 100 mmol/L (ref 98–111)
Creatinine, Ser: 0.9 mg/dL (ref 0.44–1.00)
GFR, Estimated: >60 mL/min (ref >60)
Glucose, Bld: 65 mg/dL — ABNORMAL LOW (ref 70–99)
Potassium: 3.5 mmol/L (ref 3.5–5.1)
Sodium: 134 mmol/L — ABNORMAL LOW (ref 135–145)
Total Bilirubin: 1.6 mg/dL — ABNORMAL HIGH (ref 0.3–1.2)
Total Protein: 5.8 g/dL — ABNORMAL LOW (ref 6.5–8.1)

## 2020-12-27 LAB — CALCIUM, IONIZED: Calcium, Ionized, Serum: 8.2 mg/dL — ABNORMAL HIGH (ref 4.5–5.6)

## 2020-12-27 LAB — GLUCOSE, CAPILLARY
Glucose-Capillary: 29 mg/dL — CL (ref 70–99)
Glucose-Capillary: 46 mg/dL — ABNORMAL LOW (ref 70–99)
Glucose-Capillary: 56 mg/dL — ABNORMAL LOW (ref 70–99)
Glucose-Capillary: 165 mg/dL — ABNORMAL HIGH (ref 70–99)
Glucose-Capillary: 189 mg/dL — ABNORMAL HIGH (ref 70–99)

## 2020-12-27 LAB — PHOSPHORUS: Phosphorus: 2.9 mg/dL (ref 2.5–4.6)

## 2020-12-27 LAB — MAGNESIUM: Magnesium: 1.9 mg/dL (ref 1.7–2.4)

## 2020-12-27 LAB — T3: T3, Total: 51 ng/dL — ABNORMAL LOW (ref 71–180)

## 2020-12-27 MED ORDER — SODIUM CHLORIDE 0.9 % IV SOLN
1.0000 mg/h | INTRAVENOUS | Status: DC
  Administered 2020-12-27: 1 mg/h via INTRAVENOUS
  Filled 2020-12-27: qty 2.5

## 2020-12-27 MED ORDER — DIPHENHYDRAMINE HCL 50 MG/ML IJ SOLN: 25.0000 mg | Freq: Four times a day (QID) | INTRAMUSCULAR | Status: DC | PRN

## 2020-12-27 MED ORDER — ONDANSETRON 4 MG PO TBDP: 4.0000 mg | ORAL_TABLET | Freq: Four times a day (QID) | ORAL | Status: DC | PRN

## 2020-12-27 MED ORDER — HYDROMORPHONE BOLUS VIA INFUSION
2.0000 mg | INTRAVENOUS | Status: DC | PRN
  Administered 2020-12-27: 2 mg via INTRAVENOUS
  Filled 2020-12-27: qty 2

## 2020-12-27 MED ORDER — ONDANSETRON HCL 4 MG/2ML IJ SOLN: 4.0000 mg | Freq: Four times a day (QID) | INTRAMUSCULAR | Status: DC | PRN

## 2020-12-27 MED ORDER — ATROPINE SULFATE 1 MG/10ML IJ SOSY
1.0000 mg | PREFILLED_SYRINGE | Freq: Once | INTRAMUSCULAR | Status: AC
  Administered 2020-12-27: 1 mg via INTRAVENOUS

## 2020-12-27 MED ORDER — DEXTROSE 50 % IV SOLN
INTRAVENOUS | Status: AC
  Administered 2020-12-27: 25 mL
  Filled 2020-12-27: qty 50

## 2020-12-27 MED ORDER — GLYCOPYRROLATE 1 MG PO TABS
1.0000 mg | ORAL_TABLET | ORAL | Status: DC | PRN
  Filled 2020-12-27: qty 1

## 2020-12-27 MED ORDER — DEXTROSE-NACL 5-0.9 % IV SOLN: INTRAVENOUS | Status: DC

## 2020-12-27 MED ORDER — HALOPERIDOL LACTATE 2 MG/ML PO CONC
0.5000 mg | ORAL | Status: DC | PRN
  Filled 2020-12-27: qty 0.3

## 2020-12-27 MED ORDER — HALOPERIDOL 0.5 MG PO TABS
0.5000 mg | ORAL_TABLET | ORAL | Status: DC | PRN
  Filled 2020-12-27: qty 1

## 2020-12-27 MED ORDER — DEXTROSE 50 % IV SOLN
INTRAVENOUS | Status: AC
  Administered 2020-12-27: 50 mL
  Filled 2020-12-27: qty 50

## 2020-12-27 MED ORDER — GLYCOPYRROLATE 0.2 MG/ML IJ SOLN
0.2000 mg | INTRAMUSCULAR | Status: DC | PRN
Start: 2020-12-27 — End: 2020-12-28

## 2020-12-27 MED ORDER — LORAZEPAM 2 MG/ML IJ SOLN: 1.0000 mg | INTRAMUSCULAR | Status: DC | PRN

## 2020-12-27 MED ORDER — HYDROMORPHONE HCL 1 MG/ML IJ SOLN
1.0000 mg | INTRAMUSCULAR | Status: DC | PRN
  Filled 2020-12-27: qty 1

## 2020-12-27 MED ORDER — DOPAMINE-DEXTROSE 3.2-5 MG/ML-% IV SOLN
10.0000 ug/kg/min | INTRAVENOUS | Status: DC
  Administered 2020-12-27: 10 ug/kg/min via INTRAVENOUS
  Filled 2020-12-27: qty 250

## 2020-12-27 MED ORDER — POLYVINYL ALCOHOL 1.4 % OP SOLN
1.0000 [drp] | Freq: Four times a day (QID) | OPHTHALMIC | Status: DC | PRN
Start: 2020-12-27 — End: 2020-12-28
  Filled 2020-12-27: qty 15

## 2020-12-27 MED ORDER — MAGNESIUM SULFATE IN D5W 1-5 GM/100ML-% IV SOLN
1.0000 g | Freq: Once | INTRAVENOUS | Status: AC
  Administered 2020-12-27: 1 g via INTRAVENOUS
  Filled 2020-12-27: qty 100

## 2020-12-27 MED ORDER — GLYCOPYRROLATE 0.2 MG/ML IJ SOLN: 0.2000 mg | INTRAMUSCULAR | Status: DC | PRN

## 2020-12-27 MED ORDER — HALOPERIDOL LACTATE 5 MG/ML IJ SOLN: 0.5000 mg | INTRAMUSCULAR | Status: DC | PRN

## 2020-12-27 MED ORDER — LORAZEPAM 2 MG/ML PO CONC: 1.0000 mg | ORAL | Status: DC | PRN

## 2020-12-27 NOTE — Progress Notes (Signed)
Patient decompensated overnight and early this morning with CHB with symptomatic bradycardia. Given overall frailty and poor prognosis, decision was made to transition to comfort measures after discussions held with family. Cardiology will sign-off at this time.  Laurance Flatten, MD

## 2020-12-27 NOTE — Code Documentation (Signed)
Called to patient's room because of code blue.  RN noticed rhythm change as was as bradycardia at nurses station. Pulse was subsequently lost and code was called at 0713.  CPR and atropine x 2 given.  ROSC achieved but patient remained severely bradycardic.  Orders given to initiate Dopamine at 20 mcg to maintain HR.  RT was oxygenating patient via BVM.  Family was notified by MD and they are on their way.  MD discussed GOC with daughter and they agreed that aggressive resuscitation was not appropriate for this patient.  The patient will remain on 2C.

## 2020-12-27 NOTE — Progress Notes (Signed)
Page to patients room due to code blue. The code was being run by another provider. I called the patient's family [name]) to notify them of her recent change in clinical status. I discussed the risk of chest compressions and intubation considering her multiple comorbidities. We discussed the patients GOC and decided that the patient would not want chest compressions or to be put on life support, which includes intubation. [name] states that she would like to continue lifesaving medications as needed at this time. Considering her grave prognosis, I counseled [name] to come up to the hospital to be with her mother at this time.    Chari Manning, D.O.  Internal Medicine Resident, PGY-2 [facility]  Pager: [phone] 7:29 AM, 12/27/2020

## 2020-12-27 NOTE — Progress Notes (Signed)
Noticed that glucose was low on lab work this morning.  Checked CBG, 29.  Gave 41mL D50 per hypoglycemia protocol.  Repeat CBG 56. Gave 5mL D50 per protocol.  MD paged. Fluids switched to D5NS at 149mL/h.  Second repeat CBG 46.  43mL D50 given per MD verbal order.  CBG 165.  Pt asymptomatic during all glucose levels.  Will continue to monitor.   Fletcher Anon

## 2020-12-27 NOTE — Progress Notes (Signed)
Met with patient's daughter Edythe Lynn  and [name] at bedside.  As already discussed they were notified about patient's imminent death.  Patient is already bradycardic and complete heart block and currently on dopamine, intermittent atropine.  We discussed about trajectory of the disease, near end-of-life.  Family all agreed that she should not suffer more but to initiate comfort care measures and keep her comfortable.  Plan of care:  End-of-life care: Stop all artificial interventions including vasopressors and chronotropic's. Provide comfort care measures with adequate pain medications, opiate, benzodiazepines. End-of-life care including all supportive care. Unrestricted visitor. RN to pronounce death when happens in the medical floor. Patient is not stable enough to transfer to inpatient hospice.  Anticipate hospital death.

## 2020-12-27 NOTE — Progress Notes (Signed)
SLP Cancellation Note  Patient Details Name: [name] MRN: [id number] DOB: 1933/04/21   Cancelled treatment:       Reason Eval/Treat Not Completed: Other (comment) (Per chart, pt's family has been advised of patient's "imminent death" and family has decided to transition the pt to comfort care. SLP will sign off.)  Destry Bezdek I. Vear Clock, MS, CCC-SLP Acute Rehabilitation Services Office number [phone] Pager [phone]  Scheryl Marten 12/27/2020, 11:06 AM

## 2020-12-27 NOTE — Consult Note (Signed)
ELECTROPHYSIOLOGY CONSULT NOTE    Primary Care Physician: Assunta Found, MD Referring Physician:  Dr Shari Prows  Admit Date: 01/09/2021  Reason for consultation:  AV block  [name] is a 87 y.o. female with a h/o CAD, cirrhosis and advanced age with fragility who presents with AV block. She was transferred from [facility] with complete heart block with prolonged ventricular standstill.  She also had brady dependant torsades.  She is very ill and frail.  She has had failure to thrive at home. Palliative care has been consulted and plans for comfort measures are noted.  Family is at bedside and actively planning to make her comfort care at this time.  The patient is lethargic and ill appearing.  Not communicative and unable to provide history.    Past Medical History:  Diagnosis Date  . CHF (congestive heart failure) (HCC)   . Cirrhosis (HCC)   . GERD (gastroesophageal reflux disease)   . GI bleed   . Hypertension   . Sjogren's disease (HCC)   . Thyroid disease    hypothyroidism   Past Surgical History:  Procedure Laterality Date  . CATARACT EXTRACTION W/PHACO Right 10/23/2020   Procedure: CATARACT EXTRACTION PHACO AND INTRAOCULAR LENS PLACEMENT RIGHT EYE with PLACEMENT OF CORTICOSTERIOD;  Surgeon: Fabio Pierce, MD;  Location: [facility]S;  Service: Ophthalmology;  Laterality: Right;  LPF79.02  . COLONOSCOPY WITH ESOPHAGOGASTRODUODENOSCOPY (EGD) AND ESOPHAGEAL DILATION (ED)     years ago  . COLONOSCOPY WITH PROPOFOL N/A 08/30/2019   Procedure: COLONOSCOPY WITH PROPOFOL;  Surgeon: Malissa Hippo, MD;  Location: [facility];  Service: Endoscopy;  Laterality: N/A;  . complete hysterectomy    . ESOPHAGOGASTRODUODENOSCOPY (EGD) WITH PROPOFOL N/A 08/30/2019   Procedure: ESOPHAGOGASTRODUODENOSCOPY (EGD) WITH PROPOFOL;  Surgeon: Malissa Hippo, MD;  Location: [facility];  Service: Endoscopy;  Laterality: N/A;  . left rotator cuff    . REMOVAL RETAINED LENS Right 11/30/2020    Procedure: REMOVAL RETAINED LENS FRAGMENT;  Surgeon: Fabio Pierce, MD;  Location: [facility];  Service: Ophthalmology;  Laterality: Right;    . fiber  1 packet Oral Daily  . mupirocin ointment  1 application Topical BID   . dextrose 5 % and 0.9% NaCl 125 mL/hr at 12/27/20 0500  . DOPamine 10 mcg/kg/min (12/27/20 0735)    Allergies  Allergen Reactions  . Ciprofloxacin Other (See Comments)    NO REACTION LISTED  . Asa [Aspirin] Other (See Comments)    Rectal bleeding  . Codeine Swelling    Cough syrup with codeine   . Milk-Related Compounds Diarrhea  . Penicillins     Unknown reaction Did it involve swelling of the face/tongue/throat, SOB, or low BP? Unknown Did it involve sudden or severe rash/hives, skin peeling, or any reaction on the inside of your mouth or nose? Unknown Did you need to seek medical attention at a hospital or doctor's office? Yes When did it last happen?Uknown If all above answers are "NO", may proceed with cephalosporin use. No issue with ceftriaxone  . Trazodone And Nefazodone Other (See Comments)    Confusion and hallucinations  . Demerol Palpitations  . Sulfa Antibiotics Rash    Social History   Socioeconomic History  . Marital status: Widowed    Spouse name: Not on file  . Number of children: Not on file  . Years of education: Not on file  . Highest education level: Not on file  Occupational History  . Not on file  Tobacco Use  .  Smoking status: Never Smoker  . Smokeless tobacco: Never Used  Substance and Sexual Activity  . Alcohol use: Never  . Drug use: Never  . Sexual activity: Not on file  Other Topics Concern  . Not on file  Social History Narrative   Pt lives by herself,    Social Determinants of Health   Financial Resource Strain: Not on file  Food Insecurity: Not on file  Transportation Needs: Not on file  Physical Activity: Not on file  Stress: Not on file  Social Connections: Not on file  Intimate Partner Violence:  Not on file    Family History  Problem Relation Age of Onset  . Colon cancer Neg Hx   . Colon polyps Neg Hx     ROS- pt unable to provide  Physical Exam: Telemetry:  Sinus with frequent AV block and prolonged ventricular standstill Vitals:   12/26/20 1900 12/26/20 2300 12/27/20 0300 12/27/20 0719  BP: (!) 149/65 (!) 161/70 (!) 155/76   Pulse: 75 87 85   Resp: 20 19 (!) 21   Temp: 98.1 F (36.7 C) 98.2 F (36.8 C) 98.2 F (36.8 C)   TempSrc: Oral Axillary Axillary   SpO2: 100% 99% 100%   Weight:    38 kg    GEN- The patient is in extremis Lethargic and minimally interactive Head- normocephalic, atraumatic Eyes-  Sclera with dry MM Lungs- coarse upper airway sounds Heart- Regular rate and rhythm  GI- soft  Extremities- no clubbing, cyanosis, or edema MS- diffuse atrophy  Labs:   Lab Results  Component Value Date   WBC 10.7 (H) 12/27/2020   HGB 9.3 (L) 12/27/2020   HCT 30.5 (L) 12/27/2020   MCV 79.2 (L) 12/27/2020   PLT 224 12/27/2020    Recent Labs  Lab 12/27/20 0101  NA 134*  K 3.5  CL 100  CO2 23  BUN 23  CREATININE 0.90  CALCIUM 12.7*  PROT 5.8*  BILITOT 1.6*  ALKPHOS 84  ALT 13  AST 26  GLUCOSE 65*    ASSESSMENT AND PLAN:   1. Complete heart block Given current very poor clinical conduction, she is not a candidate for EP procedures including pacing.  Her family is at bedside and report that the patients wishes would not be for aggressive interventions.  They are meeting formally with palliative care today with plans to proceed with comfort care measures only.  I think that this is a very appropriate approach.  Electrophysiology team to see as needed while here. Please call with questions.      Hillis Range, MD 12/27/2020  9:31 AM

## 2020-12-27 NOTE — Progress Notes (Signed)
PROGRESS NOTE    [name]  DGL:[id number] DOB: 1932/11/21 DOA: 01-10-21 PCP: Assunta Found, MD    Brief Narrative:  87 year old female with history of coronary artery disease, Sjogren's syndrome, chronic diastolic heart failure, GERD, hypothyroidism brought to the ER with profound weakness for the last 2 to 3 weeks.  Patient has significant Sjogren syndrome, she is mostly dehydrated and also taking diuretics with recent bouts of diastolic CHF.  She lives at home and her daughter [name] helps her around almost half of the day.  Patient also with poor appetite and poor quality of life for about 6 months now. At the emergency room, EKG was consistent with complete heart block, potassium 2.9, sodium 128, calcium 14.2, magnesium 1.4.  TSH 5.4.  COVID-19 negative.  She was transferred ER to ER with temporary pacer, subsequently converted to sinus rhythm with left bundle branch block.  3/13, 7 AM rapid response and subsequently CODE BLUE called for bradycardia, unresponsiveness and hypoxia.  Patient was noted to be unresponsive, telemetry showed sinus bradycardia with heart rate 35 and complete heart block, blood pressure is stable, hypoxic with 70% on room air.  Bagged by respiratory therapist.  Attended by internal medicine resident.  Discussed with family about poor prognosis and they wished against CPR and intubation. Patient started on dopamine drip 10 mcg/kg/min, further replacement of magnesium.  Patient now with complete heart block with ventricular rate about 36-40/min.  She remains on nonrebreather. Patient has grave prognosis, she is not a pacemaker candidate given extensive underlying issues and near end-of-life with frailty, debility and failure to thrive. Family members called to the hospital, will meet with them and discuss about providing end-of-life care.   Assessment & Plan:   Principal Problem:   Heart block AV third degree (HCC) Active Problems:   Portal hypertension ---  liver cirrhosis   Cirrhosis of liver with ascites and portal hypertension   Hypothyroidism   Hypokalemia   AKI (acute kidney injury) (HCC)   Hypomagnesemia   Hyponatremia   Hypercalcemia  Third-degree heart block: Recurrent. Not on any rate control medications.  Probably degenerative disease along with severe electrolyte abnormalities.  aggressively replace electrolytes.  Repeat echocardiogram today. See above.  Patient is not a pacemaker candidate.  Poor survival.   Currently on dopamine added atropine as needed.  Continue chemical support until patient's family can come and visit her to discuss about comfort care pathway.   Hypovolemic hyponatremia, hypokalemia, hypomagnesemia: Received 2 L isotonic fluid, keep on maintenance fluid also to correct calcium with dextrose.  Reported hypoglycemic event in the morning. Replace aggressively potassium, will keep more than 4.  Replace magnesium aggressively, will keep more than 2.  Discontinue Lasix.  Hypercalcemia: Presented with total calcium 14.2.  Previous calcium levels were normal.   Patient on vitamin D at home.   PTH suppressed, vitamin D levels more than 60.  Suggesting parathyroid suppression secondary to peripheral cause.  May have underlying malignancy but likely because of total body deficits.    Hypothyroidism: TSH is normal level.  Resumed home thyroxine  Physical debility/frailty: Patient has underlying Sjogren's syndrome and multiple other comorbidities, she has been gradually losing weight for the last 6 to 9 months.  Does not eat well at home. Palliative care was consulted.  They will meeting for goal of care today, however patient's condition has deteriorated.  Will meet with family when they arrive to discuss about end-of-life care.    DVT prophylaxis: SCDs Start: 12/26/20 0125  Code Status: DNR/DNI Family Communication: Patient's daughter at on the phone Disposition Plan: Status is: Inpatient  Remains inpatient  appropriate because:Persistent severe electrolyte disturbances, IV treatments appropriate due to intensity of illness or inability to take PO and Inpatient level of care appropriate due to severity of illness   Dispo: The patient is from: Home              Anticipated d/c is to: Home with home hospice              Patient currently is not medically stable to d/c.   Difficult to place patient No         Consultants:   Cardiology  Procedures:   None  Antimicrobials:   None   Subjective: Patient seen and examined.  Currently patient is minimally responsive, she looks at us but does not talk.  She is unable to express anything.  Objective: Vitals:   12/26/20 1900 12/26/20 2300 12/27/20 0300 12/27/20 0719  BP: (!) 149/65 (!) 161/70 (!) 155/76   Pulse: 75 87 85   Resp: 20 19 (!) 21   Temp: 98.1 F (36.7 C) 98.2 F (36.8 C) 98.2 F (36.8 C)   TempSrc: Oral Axillary Axillary   SpO2: 100% 99% 100%   Weight:    38 kg    Intake/Output Summary (Last 24 hours) at 12/27/2020 0749 Last data filed at 12/27/2020 0500 Gross per 24 hour  Intake 2126.45 ml  Output 300 ml  Net 1826.45 ml   Filed Weights   12/27/20 0719  Weight: 38 kg    Examination:  General exam: Appears chronically ill, frail debilitated and cachectic.  100% nonrebreather.  In moderate distress.  Mucous membranes dry. Respiratory system: Mostly conducted airway sounds.  Bilateral poor air entry. Cardiovascular system: S1 & S2 heard, RRR.  Bradycardic. Gastrointestinal system: Soft and nontender. Central nervous system: Alert on stimulation.  Orientation is difficult to assess with lethargic. Extremities: Symmetric 5 x 5 power.  Severe generalized weakness. Skin: No rashes, lesions or ulcers Psychiatry: Mood & affect flat and anxious.    Data Reviewed: I have personally reviewed following labs and imaging studies  CBC: Recent Labs  Lab 01/02/2021 1457 12/26/20 0201 12/27/20 0101  WBC 12.4* 9.1  10.7*  NEUTROABS  --   --  9.1*  HGB 11.8* 9.6* 9.3*  HCT 38.4 30.4* 30.5*  MCV 79.0* 77.7* 79.2*  PLT 282 241 224   Basic Metabolic Panel: Recent Labs  Lab 12/27/2020 1457 01/05/2021 2126 12/26/20 0201 12/27/20 0101  NA 128*  --  131* 134*  K 2.9*  --  3.6 3.5  CL 84*  --  95* 100  CO2 28  --  26 23  GLUCOSE 95  --  79 65*  BUN 22  --  19 23  CREATININE 0.85  --  0.75 0.90  CALCIUM 14.2*  --  12.7* 12.7*  MG  --  1.4* 1.7 1.9  PHOS  --   --   --  2.9   GFR: Estimated Creatinine Clearance: 27.4 mL/min (by C-G formula based on SCr of 0.9 mg/dL). Liver Function Tests: Recent Labs  Lab 01/10/2021 1457 12/26/20 0201 12/27/20 0101  AST 23 21 26   ALT 11 10 13   ALKPHOS 119 84 84  BILITOT 0.9 1.4* 1.6*  PROT 7.3 5.7* 5.8*  ALBUMIN 2.9* 2.2* 2.2*   Recent Labs  Lab 01/05/2021 1457  LIPASE 24   Recent Labs  Lab 12/24/2020 1852  AMMONIA  14   Coagulation Profile: No results for input(s): INR, PROTIME in the last 168 hours. Cardiac Enzymes: No results for input(s): CKTOTAL, CKMB, CKMBINDEX, TROPONINI in the last 168 hours. BNP (last 3 results) No results for input(s): PROBNP in the last 8760 hours. HbA1C: No results for input(s): HGBA1C in the last 72 hours. CBG: Recent Labs  Lab 12/27/20 0345 12/27/20 0414 12/27/20 0417 12/27/20 0508 12/27/20 0720  GLUCAP 56* 46* 41* 165* 189*   Lipid Profile: No results for input(s): CHOL, HDL, LDLCALC, TRIG, CHOLHDL, LDLDIRECT in the last 72 hours. Thyroid Function Tests: Recent Labs    01/13/2021 1852 12/26/20 0201  TSH 5.417*  --   FREET4  --  0.86   Anemia Panel: Recent Labs    12/26/20 0906  VITAMINB12 >7,500*  FOLATE 5.2*   Sepsis Labs: No results for input(s): PROCALCITON, LATICACIDVEN in the last 168 hours.  Recent Results (from the past 240 hour(s))  Resp Panel by RT-PCR (Flu A&B, Covid) Nasopharyngeal Swab     Status: None   Collection Time: 01/02/2021  6:30 PM   Specimen: Nasopharyngeal Swab;  Nasopharyngeal(NP) swabs in vial transport medium  Result Value Ref Range Status   SARS Coronavirus 2 by RT PCR NEGATIVE NEGATIVE Final    Comment: (NOTE) SARS-CoV-2 target nucleic acids are NOT DETECTED.  The SARS-CoV-2 RNA is generally detectable in upper respiratory specimens during the acute phase of infection. The lowest concentration of SARS-CoV-2 viral copies this assay can detect is 138 copies/mL. A negative result does not preclude SARS-Cov-2 infection and should not be used as the sole basis for treatment or other patient management decisions. A negative result may occur with  improper specimen collection/handling, submission of specimen other than nasopharyngeal swab, presence of viral mutation(s) within the areas targeted by this assay, and inadequate number of viral copies(<138 copies/mL). A negative result must be combined with clinical observations, patient history, and epidemiological information. The expected result is Negative.  Fact Sheet for Patients:  BloggerCourse.com  Fact Sheet for Healthcare Providers:  SeriousBroker.it  This test is no t yet approved or cleared by the [place] FDA and  has been authorized for detection and/or diagnosis of SARS-CoV-2 by FDA under an Emergency Use Authorization (EUA). This EUA will remain  in effect (meaning this test can be used) for the duration of the COVID-19 declaration under Section 564(b)(1) of the Act, 21 U.S.C.section 360bbb-3(b)(1), unless the authorization is terminated  or revoked sooner.       Influenza A by PCR NEGATIVE NEGATIVE Final   Influenza B by PCR NEGATIVE NEGATIVE Final    Comment: (NOTE) The Xpert Xpress SARS-CoV-2/FLU/RSV plus assay is intended as an aid in the diagnosis of influenza from Nasopharyngeal swab specimens and should not be used as a sole basis for treatment. Nasal washings and aspirates are unacceptable for Xpert Xpress  SARS-CoV-2/FLU/RSV testing.  Fact Sheet for Patients: BloggerCourse.com  Fact Sheet for Healthcare Providers: SeriousBroker.it  This test is not yet approved or cleared by the [place] FDA and has been authorized for detection and/or diagnosis of SARS-CoV-2 by FDA under an Emergency Use Authorization (EUA). This EUA will remain in effect (meaning this test can be used) for the duration of the COVID-19 declaration under Section 564(b)(1) of the Act, 21 U.S.C. section 360bbb-3(b)(1), unless the authorization is terminated or revoked.  Performed at [facility], [address], [place], [place] [address]   MRSA PCR Screening     Status: None   Collection Time:  12/26/20 10:22 AM   Specimen: Nasal Mucosa; Nasopharyngeal  Result Value Ref Range Status   MRSA by PCR NEGATIVE NEGATIVE Final    Comment:        The GeneXpert MRSA Assay (FDA approved for NASAL specimens only), is one component of a comprehensive MRSA colonization surveillance program. It is not intended to diagnose MRSA infection nor to guide or monitor treatment for MRSA infections. Performed at [facility], [address] [place], [place] [address]          Radiology Studies: DG Chest Port 1 View  Result Date: 01/03/21 CLINICAL DATA:  Weakness EXAM: PORTABLE CHEST 1 VIEW COMPARISON:  01/21/2020 FINDINGS: The heart size and mediastinal contours are within normal limits. Elevation of the left hemidiaphragm. No acute appearing airspace opacity. The visualized skeletal structures are unremarkable. IMPRESSION: Elevation of the left hemidiaphragm. No acute appearing airspace opacity in AP portable projection. Electronically Signed   By: Lauralyn Primes M.D.   On: Jan 03, 2021 19:02   ECHOCARDIOGRAM COMPLETE  Result Date: 12/26/2020    ECHOCARDIOGRAM REPORT   Patient Name:   LAURYNN MCCORVEY Date of Exam: 12/26/2020 Medical Rec #:  [id number]        Height:        60.0 in Accession #:    [id number]       Weight:       83.1 lb Date of Birth:  01/06/1933        BSA:          1.285 m Patient Age:    87 years         BP:           157/69 mmHg Patient Gender: F                HR:           75 bpm. Exam Location:  Inpatient Procedure: 2D Echo, Cardiac Doppler and Color Doppler Indications:    3rd degree block  History:        Patient has prior history of Echocardiogram examinations, most                 recent 08/28/2019. CHF, Arrythmias:3rd degree block; Risk                 Factors:Hypertension.  Sonographer:    Neomia Dear RDCS Referring Phys: [id number] [facility]  Sonographer Comments: Technically difficult study due to poor echo windows and suboptimal parasternal window. IMPRESSIONS  1. Very difficult study. No parasternal windows.  2. Left ventricular ejection fraction, by estimation, is 60 to 65%. The left ventricle has normal function. The left ventricle has no regional wall motion abnormalities. Left ventricular diastolic function could not be evaluated.  3. Right ventricular systolic function is normal. The right ventricular size is normal. There is moderately elevated pulmonary artery systolic pressure. The estimated right ventricular systolic pressure is 58.1 mmHg.  4. Moderate calcific MS. MG 7.0 @ 73 bpm. MVA 1.7 cm2. Mild to moderate MR. The mitral valve is degenerative. Mild to moderate mitral valve regurgitation. Moderate mitral stenosis. The mean mitral valve gradient is 7.0 mmHg with average heart rate of 73  bpm. Moderate to severe mitral annular calcification.  5. The aortic valve was not well visualized. Aortic valve regurgitation is mild. No aortic stenosis is present.  6. The inferior vena cava is normal in size with greater than 50% respiratory variability, suggesting right atrial pressure of 3 mmHg. Comparison(s): No  significant change from prior study. FINDINGS  Left Ventricle: Left ventricular ejection fraction, by estimation, is 60 to 65%. The left  ventricle has normal function. The left ventricle has no regional wall motion abnormalities. The left ventricular internal cavity size was normal in size. There is  no left ventricular hypertrophy. Left ventricular diastolic function could not be evaluated due to mitral annular calcification (moderate or greater). Left ventricular diastolic function could not be evaluated. Right Ventricle: The right ventricular size is normal. No increase in right ventricular wall thickness. Right ventricular systolic function is normal. There is moderately elevated pulmonary artery systolic pressure. The tricuspid regurgitant velocity is 3.71 m/s, and with an assumed right atrial pressure of 3 mmHg, the estimated right ventricular systolic pressure is 58.1 mmHg. Left Atrium: Left atrial size was normal in size. Right Atrium: Right atrial size was normal in size. Pericardium: Trivial pericardial effusion is present. Mitral Valve: Moderate calcific MS. MG 7.0 @ 73 bpm. MVA 1.7 cm2. Mild to moderate MR. The mitral valve is degenerative in appearance. Moderate to severe mitral annular calcification. Mild to moderate mitral valve regurgitation. Moderate mitral valve stenosis. MV peak gradient, 15.7 mmHg. The mean mitral valve gradient is 7.0 mmHg with average heart rate of 73 bpm. Tricuspid Valve: The tricuspid valve is grossly normal. Tricuspid valve regurgitation is mild . No evidence of tricuspid stenosis. Aortic Valve: The aortic valve was not well visualized. Aortic valve regurgitation is mild. Aortic regurgitation PHT measures 443 msec. No aortic stenosis is present. Aortic valve mean gradient measures 6.0 mmHg. Aortic valve peak gradient measures 13.4 mmHg. Aortic valve area, by VTI measures 2.81 cm. Pulmonic Valve: The pulmonic valve was not assessed. Pulmonic valve regurgitation is not visualized. Aorta: The aortic root is normal in size and structure. Venous: The inferior vena cava is normal in size with greater than 50%  respiratory variability, suggesting right atrial pressure of 3 mmHg. IAS/Shunts: The atrial septum is grossly normal.  LEFT VENTRICLE PLAX 2D LVOT diam:     2.00 cm     Diastology LV SV:         85          LV e' medial:    4.79 cm/s LV SV Index:   66          LV E/e' medial:  35.7 LVOT Area:     3.14 cm    LV e' lateral:   5.00 cm/s                            LV E/e' lateral: 34.2  LV Volumes (MOD) LV vol d, MOD A2C: 50.5 ml LV vol d, MOD A4C: 62.5 ml LV vol s, MOD A2C: 16.1 ml LV vol s, MOD A4C: 16.8 ml LV SV MOD A2C:     34.4 ml LV SV MOD A4C:     62.5 ml LV SV MOD BP:      39.9 ml RIGHT VENTRICLE RV S prime:     12.80 cm/s TAPSE (M-mode): 2.1 cm LEFT ATRIUM             Index       RIGHT ATRIUM           Index LA Vol (A2C):   28.2 ml 21.94 ml/m RA Area:     13.80 cm LA Vol (A4C):   42.7 ml 33.23 ml/m RA Volume:   35.60 ml  27.70 ml/m LA Biplane Vol:  36.7 ml 28.56 ml/m  AORTIC VALVE AV Area (Vmax):    2.44 cm AV Area (Vmean):   2.97 cm AV Area (VTI):     2.81 cm AV Vmax:           183.00 cm/s AV Vmean:          113.000 cm/s AV VTI:            0.302 m AV Peak Grad:      13.4 mmHg AV Mean Grad:      6.0 mmHg LVOT Vmax:         142.00 cm/s LVOT Vmean:        107.000 cm/s LVOT VTI:          0.270 m LVOT/AV VTI ratio: 0.89 AI PHT:            443 msec MITRAL VALVE                 TRICUSPID VALVE MV Area (PHT): 3.27 cm      TR Peak grad:   55.1 mmHg MV Area VTI:   1.76 cm      TR Vmax:        371.00 cm/s MV Peak grad:  15.7 mmHg MV Mean grad:  7.0 mmHg      SHUNTS MV Vmax:       1.98 m/s      Systemic VTI:  0.27 m MV Vmean:      119.0 cm/s    Systemic Diam: 2.00 cm MV VTI:        0.48 m MV Decel Time: 232 msec MR Peak grad:    117.7 mmHg MR Mean grad:    80.5 mmHg MR Vmax:         542.50 cm/s MR Vmean:        424.0 cm/s MR PISA:         0.57 cm MR PISA Eff ROA: 3 mm MR PISA Radius:  0.30 cm MV E velocity: 171.00 cm/s MV A velocity: 200.00 cm/s MV E/A ratio:  0.86 Lennie Odor MD Electronically signed by  Lennie Odor MD Signature Date/Time: 12/26/2020/2:29:54 PM    Final         Scheduled Meds: . ascorbic acid  500 mg Oral Daily  . ferrous sulfate  325 mg Oral BID AC  . fiber  1 packet Oral Daily  . levothyroxine  25 mcg Oral Daily  . mupirocin ointment  1 application Topical BID  . pantoprazole  40 mg Oral Daily  . sertraline  25 mg Oral q morning  . spironolactone  50 mg Oral Daily  . thyroid  120 mg Oral QAC breakfast  . vitamin B-12  1,000 mcg Oral Daily   Continuous Infusions: . dextrose 5 % and 0.9% NaCl 125 mL/hr at 12/27/20 0500  . DOPamine    . potassium chloride Stopped (12/26/20 2243)     LOS: 2 days    Time spent: 35 minutes    Dorcas Carrow, MD [facility] Pager [phone]

## 2020-12-27 NOTE — Progress Notes (Signed)
   12/27/20 0715  Clinical Encounter Type  Visited With Patient  Visit Type Code  Referral From  (PAGE)  Consult/Referral To Chaplain  Chaplain responded to Code Blue for Ms. Appelhans.  Medical Team was working to revive her.  Family was called and will be arriving to hospital.  Chaplain informed if family needs the Chaplain to return please page.  Chaplain Perfecto Purdy Morgan-Simpson [phone]

## 2020-12-27 NOTE — Significant Event (Signed)
Walking by hall when code blue alarm went off. Nursing at bedside. Patient with worsening bradycardia (transferred for CHB) and brief loss of pulse, few seconds of chest compressions by nursing staff. On my assessment patient HTN (sBP 140s), bradycardic (HR 35) in CHB, responsive but somnolent. FULL CODE confirmed with nursing however recent palliative care discussions. Hypoxic to the 70s. PIV x2 in b/l UE. Strong pulse palpated. Atropine 1 mg IV given with HR 30s->mid 40s. Worsening hypoxia to the 60s. Bagged by RT and then CRNA at bedside. Resident able to reach family and discussed unfortunate situation. Family elected to transition from FULL code to DNAR/DNI however to continue supportive medications. Not clearly transitioned to comfort care yet. DA 10 mcg/kg/min ordered for CHB. Primary team at bedside and taking over care.

## 2020-12-27 NOTE — Progress Notes (Signed)
Daily Progress Note   Patient Name: [name]       Date: 12/27/2020 DOB: April 10, 1933  Age: 87 y.o. MRN#: [id number] Attending Physician: Dorcas Carrow, MD Primary Care Physician: Assunta Found, MD Admit Date: 12/16/2020  Reason for Consultation/Follow-up: Establishing goals of care, Non pain symptom management, Pain control, Psychosocial/spiritual support and Terminal Care  Subjective: Notified that patient had cardiac event this morning requiring resuscitative measures of CPR and atropine x2. Chart review performed and noted family decision for DNR/DNI and full comfort measures. Received report from primary RN - no acute concerns but states since comfort measures initiated patient had 1 hour of Vtach and note patient is currently in complete heart block.  Went to visit patient at bedside - daughter/HCPOA/[name], daughter/[name], son/[name] as well as several other family members present. Patient was lying in bed - she does not wake to voice/gentle touch. No signs or non-verbal gestures of pain or discomfort noted. No respiratory distress or secretions; increased work of breathing noted. She is wearing NRB.   Emotional support and therapeutic listening provided to family. We talked about transition to comfort measures in house and what that would entail inclusive of medications to control pain, dyspnea, agitation, nausea, itching, and hiccups. We discussed stopping all unnecessary measures such as blood draws, needle sticks, oxygen, antibiotics, CBGs/insulin, cardiac monitoring, and frequent vital signs. Family are agreeable to all above as well as to remove all oxygen - discussed plan for symptom management with oxygen removal. Prognostication discussed per family request - patient likely has  hours to <24 hours. Expectations at EOL were reviewed. Discussed with family that patient is too unstable for transfer - they are agreeable for Ms. Puckett to remain in house for EOL care.   Explained current [facility] EOL visitation policy - family expressed understanding.  Provided "Gone From My Sight" book.   All questions and concerns addressed. Encouraged to call with questions and/or concerns. PMT card provided.     Length of Stay: 2  Current Medications: Scheduled Meds:  . fiber  1 packet Oral Daily  . mupirocin ointment  1 application Topical BID    Continuous Infusions: . dextrose 5 % and 0.9% NaCl 125 mL/hr at 12/27/20 0500  . DOPamine 10 mcg/kg/min (12/27/20 0735)  . HYDROmorphone      PRN Meds:  acetaminophen **OR** acetaminophen, acetaminophen, haloperidol **OR** haloperidol **OR** haloperidol lactate, HYDROmorphone (DILAUDID) injection, neomycin-bacitracin-polymyxin, ondansetron **OR** ondansetron (ZOFRAN) IV  Physical Exam Vitals and nursing note reviewed.  Constitutional:      General: She is not in acute distress.    Appearance: She is ill-appearing.  Pulmonary:     Effort: No respiratory distress.     Comments: Increased work of breathing Skin:    General: Skin is cool and dry.  Neurological:     Mental Status: She is lethargic.     Motor: Weakness present.  Psychiatric:        Speech: She is noncommunicative.             Vital Signs: BP (!) 155/76 (BP Location: Left Arm)   Pulse 85   Temp 98.2 F (36.8 C) (Axillary)   Resp (!) 21   Wt 38 kg   SpO2 100%   BMI 16.36 kg/m  SpO2: SpO2: 100 % O2 Device: O2 Device: Room Air O2 Flow Rate: O2 Flow Rate (L/min): 2 L/min  Intake/output summary:   Intake/Output Summary (Last 24 hours) at 12/27/2020 1021 Last data filed at 12/27/2020 0500 Gross per 24 hour  Intake 2076.45 ml  Output 300 ml  Net 1776.45 ml   LBM:   Baseline Weight: Weight: 38 kg Most recent weight: Weight: 38 kg        Palliative Assessment/Data: PPS 10%    Flowsheet Rows   Flowsheet Row Most Recent Value  Intake Tab   Referral Department Hospitalist  Unit at Time of Referral Cardiac/Telemetry Unit  Palliative Care Primary Diagnosis Cardiac  Date Notified 12/26/20  Palliative Care Type New Palliative care  Reason for referral Clarify Goals of Care  Date of Admission 01/13/2021  Date first seen by Palliative Care 12/26/20  # of days Palliative referral response time 0 Day(s)  # of days IP prior to Palliative referral 1  Clinical Assessment   Psychosocial & Spiritual Assessment   Palliative Care Outcomes   Patient/Family meeting held? Yes  Who was at the meeting? daughter, son  Palliative Care Outcomes Clarified goals of care, Provided psychosocial or spiritual support  [full family meeting tomorrow]      Patient Active Problem List   Diagnosis Date Noted  . End of life care 12/27/2020  . Hypomagnesemia 01/10/2021  . Hyponatremia 01/14/2021  . Heart block AV third degree (HCC) 01/07/2021  . Hypercalcemia 01/07/2021  . Decompensated heart failure (HCC) 10/31/2019  . Acute on chronic anemia 09/17/2019  . Elevated lactic acid level 09/17/2019  . AKI (acute kidney injury) (HCC) 09/17/2019  . GI hemorrhage 09/09/2019  . Acute blood loss anemia 09/01/2019  . Lower GI bleed 09/01/2019  . Syncope 09/01/2019  . Hypothyroidism 09/01/2019  . Hypokalemia 09/01/2019  . Chronic diarrhea 09/01/2019  . Symptomatic anemia 08/27/2019  . Portal hypertension --- liver cirrhosis 08/27/2019  . Cirrhosis of liver with ascites and portal hypertension 08/27/2019  . Diarrhea 04/11/2019    Palliative Care Assessment & Plan   Patient Profile: 87 y.o. female  with past medical history of Sjogren's disease, CHF, hypothyroidism, liver cirrhosis, and CAD presented to the [facility] ED from home with family concerns of dehydration and increased weakness. In ED she was found to have complete heart block with  subsequent torsades. Patient was given magnesium and potassium, which were low. Following treatment she was evaluated by cardiology and transferred to [facility] ED to be observed closely in case arrythmia returned. Patient was admitted on  12/31/2020 with third degree heart block, hypokalemia, hypomagnesemia, hypercalcemia, and AKI.  ED Course:Temperature 99.3 blood pressure 150/90 pulse 129 initially respiratory 27 oxygen sats 80% on room air currently 100% on 2 L. Sodium 128 potassium 2.9 chloride 84 CO2 28 glucose 95 BUN 22 creatinine 0.85 calcium 14.2 and a gap of 16. White count 12.4 hemoglobin 11.8. TSH of 5.417. COVID-19 screen is negative. Magnesium 1.4. Urinalysis is negative. Chest x-ray showed elevation of the left hemidiaphragm no acute airspace opacity.   Assessment: Third degree AV heart block Chronic diastolic heart failure Sjogren's syndrome Liver cirrhosis AKI Hypomagnesemia Hyponatremia Hypercalcemia Hypokalemia Physical debility/frailty Terminal care  Recommendations/Plan: . Continue full comfort measures . Continue DNR/DNI as previously documented . Patient is too unstable for transfer, anticipate hospital death - prognosis likely hours to <24 hours . Wean to room air per Care Order Instructions . Added orders for EOL symptom management and to reflect full comfort measures, as well as discontinued orders that were not focused on comfort . Nursing to provide frequent assessments and administer PRN medications as clinically necessary to ensure EOL comfort . PMT will continue to follow and support holistically   Goals of Care and Additional Recommendations:  Limitations on Scope of Treatment: Full Comfort Care  Code Status:    Code Status Orders  (From admission, onward)         Start     Ordered   12/27/20 0850  Do not attempt resuscitation (DNR)  Continuous       Question Answer Comment  In the event of cardiac or respiratory ARREST Do not call a "code  blue"   In the event of cardiac or respiratory ARREST Do not perform Intubation, CPR, defibrillation or ACLS   In the event of cardiac or respiratory ARREST Use medication by any route, position, wound care, and other measures to relive pain and suffering. May use oxygen, suction and manual treatment of airway obstruction as needed for comfort.      12/27/20 0852        Code Status History    Date Active Date Inactive Code Status Order ID Comments User Context   12/27/2020 0749 12/27/2020 0852 DNR [id number]  Dorcas Carrow, MD Inpatient   12/26/2020 0124 12/27/2020 0749 Full Code [id number]  Rometta Emery, MD ED   10/31/2019 2145 11/05/2019 1853 Full Code [id number]  Onnie Boer, MD ED   09/17/2019 2205 09/18/2019 2048 Full Code [id number]  Howerter, Justin B, DO ED   09/09/2019 0530 09/10/2019 2043 Full Code [id number]  Zierle-Ghosh, Asia B, DO ED   09/04/2019 1346 09/06/2019 1936 Full Code [id number]  Maurilio Lovely D, DO ED   08/27/2019 2052 09/01/2019 1741 Full Code [id number]  Shon Hale, MD ED   Advance Care Planning Activity       Prognosis:   Hours - Days  Discharge Planning:  Anticipated Hospital Death  Care plan was discussed with primary RN, patient's family  Thank you for allowing the Palliative Medicine Team to assist in the care of this patient.   Total Time 35 minutes Prolonged Time Billed  no       Greater than 50%  of this time was spent counseling and coordinating care related to the above assessment and plan.  Haskel Khan, NP  Please contact Palliative Medicine Team phone at [phone] for questions and concerns.

## 2020-12-28 ENCOUNTER — Ambulatory Visit (INDEPENDENT_AMBULATORY_CARE_PROVIDER_SITE_OTHER): Payer: Medicare Other | Admitting: Gastroenterology

## 2020-12-28 LAB — GLUCOSE, CAPILLARY: Glucose-Capillary: 41 mg/dL — CL (ref 70–99)

## 2020-12-29 LAB — PTH, INTACT AND CALCIUM
Calcium, Total (PTH): 12.9 mg/dL — ABNORMAL HIGH (ref 8.7–10.3)
PTH: 8 pg/mL — ABNORMAL LOW (ref 15–65)

## 2021-01-02 MED FILL — Medication: Qty: 1 | Status: AC

## 2021-01-14 DIAGNOSIS — I10 Essential (primary) hypertension: Secondary | ICD-10-CM | POA: Diagnosis not present

## 2021-01-14 DIAGNOSIS — E7849 Other hyperlipidemia: Secondary | ICD-10-CM | POA: Diagnosis not present

## 2021-01-15 NOTE — Progress Notes (Signed)
23mL IV dilaudid from drip wasted in stericycle.  Witnessed by Eldred Manges, RN.

## 2021-01-15 NOTE — Death Summary Note (Signed)
DEATH SUMMARY   Patient Details  Name: [name] MRN: [id number] DOB: [birth year]  Admission/Discharge Information   Admit Date:  01/02/2021  Date of Death: Date of Death: February 12, 2021  Time of Death: Time of Death: 0350  Length of Stay: 3  Referring Physician: Assunta FoundGolding, John, MD   Reason(s) for Hospitalization  Lethargic, altered mental status, cardiac arrhythmia  Diagnoses  Preliminary cause of death:  Secondary Diagnoses (including complications and co-morbidities):  Principal Problem:   Heart block AV third degree (HCC) Active Problems:   Portal hypertension --- liver cirrhosis   Cirrhosis of liver with ascites and portal hypertension   Hypothyroidism   Hypokalemia   AKI (acute kidney injury) (HCC)   Hypomagnesemia   Hyponatremia   Hypercalcemia   End of life care   Brief Hospital Course (including significant findings, care, treatment, and services provided and events leading to death)  [name] is a 87 y.o. year old female who Has history of coronary artery disease, Sjogren's syndrome, chronic diastolic heart failure, GERD, hypothyroidism and recently worsening weakness, failure to thrive and progressive debility from home was brought to [facility] by her daughter with significant dehydration, not eating or drinking well and profound weakness.  In the emergency room, she was found significant electrolyte abnormalities including potassium 2.9, sodium 128, calcium 14.2, magnesium 1.4.  EMS reported complete heart block.  Patient was then subsequently transferred to [facility]  for cardiology care.  Patient had also developed torsade in the ER. Patient was admitted to the hospital, aggressive electrolyte replacement done.  Patient was found to be with multiple comorbidities, gradual loss of weight for the last 6 months, very frail and debilitated, unable to eat or drink with Sjogren's syndrome and debility.  Patient continued to have episodes of  complete heart block, wide-complex tachycardias with altered mental status.  She had wishes for no CPR and artificial support, a reasonable medical support including IV medications to support her heart was started, however patient continued to deteriorate and she was provided with end-of-life care.  Patient died in the hospital while in comfort care measures with family at the bedside.  Immediate cause of death Complete heart block, ventricular tachycardia  Degenerative heart disease Coronary artery disease Adult failure to thrive    Pertinent Labs and Studies  Significant Diagnostic Studies DG Chest Port 1 View  Result Date: 12/22/2020 CLINICAL DATA:  Weakness EXAM: PORTABLE CHEST 1 VIEW COMPARISON:  01/21/2020 FINDINGS: The heart size and mediastinal contours are within normal limits. Elevation of the left hemidiaphragm. No acute appearing airspace opacity. The visualized skeletal structures are unremarkable. IMPRESSION: Elevation of the left hemidiaphragm. No acute appearing airspace opacity in AP portable projection. Electronically Signed   By: Lauralyn PrimesAlex  Bibbey M.D.   On: 12/15/2020 19:02   ECHOCARDIOGRAM COMPLETE  Result Date: 12/26/2020    ECHOCARDIOGRAM REPORT   Patient Name:   [name] Date of Exam: 12/26/2020 Medical Rec #:  [id number]        Height:       60.0 in Accession #:    [id number]       Weight:       83.1 lb Date of Birth:  [birth year]        BSA:          1.285 m Patient Age:    87 years         BP:           157/69 mmHg Patient Gender:  F                HR:           75 bpm. Exam Location:  Inpatient Procedure: 2D Echo, Cardiac Doppler and Color Doppler Indications:    3rd degree block  History:        Patient has prior history of Echocardiogram examinations, most                 recent 08/28/2019. CHF, Arrythmias:3rd degree block; Risk                 Factors:Hypertension.  Sonographer:    Neomia Dear RDCS Referring Phys: [id number] [facility]  Sonographer Comments:  Technically difficult study due to poor echo windows and suboptimal parasternal window. IMPRESSIONS  1. Very difficult study. No parasternal windows.  2. Left ventricular ejection fraction, by estimation, is 60 to 65%. The left ventricle has normal function. The left ventricle has no regional wall motion abnormalities. Left ventricular diastolic function could not be evaluated.  3. Right ventricular systolic function is normal. The right ventricular size is normal. There is moderately elevated pulmonary artery systolic pressure. The estimated right ventricular systolic pressure is 58.1 mmHg.  4. Moderate calcific MS. MG 7.0 @ 73 bpm. MVA 1.7 cm2. Mild to moderate MR. The mitral valve is degenerative. Mild to moderate mitral valve regurgitation. Moderate mitral stenosis. The mean mitral valve gradient is 7.0 mmHg with average heart rate of 73  bpm. Moderate to severe mitral annular calcification.  5. The aortic valve was not well visualized. Aortic valve regurgitation is mild. No aortic stenosis is present.  6. The inferior vena cava is normal in size with greater than 50% respiratory variability, suggesting right atrial pressure of 3 mmHg. Comparison(s): No significant change from prior study. FINDINGS  Left Ventricle: Left ventricular ejection fraction, by estimation, is 60 to 65%. The left ventricle has normal function. The left ventricle has no regional wall motion abnormalities. The left ventricular internal cavity size was normal in size. There is  no left ventricular hypertrophy. Left ventricular diastolic function could not be evaluated due to mitral annular calcification (moderate or greater). Left ventricular diastolic function could not be evaluated. Right Ventricle: The right ventricular size is normal. No increase in right ventricular wall thickness. Right ventricular systolic function is normal. There is moderately elevated pulmonary artery systolic pressure. The tricuspid regurgitant velocity is 3.71  m/s, and with an assumed right atrial pressure of 3 mmHg, the estimated right ventricular systolic pressure is 58.1 mmHg. Left Atrium: Left atrial size was normal in size. Right Atrium: Right atrial size was normal in size. Pericardium: Trivial pericardial effusion is present. Mitral Valve: Moderate calcific MS. MG 7.0 @ 73 bpm. MVA 1.7 cm2. Mild to moderate MR. The mitral valve is degenerative in appearance. Moderate to severe mitral annular calcification. Mild to moderate mitral valve regurgitation. Moderate mitral valve stenosis. MV peak gradient, 15.7 mmHg. The mean mitral valve gradient is 7.0 mmHg with average heart rate of 73 bpm. Tricuspid Valve: The tricuspid valve is grossly normal. Tricuspid valve regurgitation is mild . No evidence of tricuspid stenosis. Aortic Valve: The aortic valve was not well visualized. Aortic valve regurgitation is mild. Aortic regurgitation PHT measures 443 msec. No aortic stenosis is present. Aortic valve mean gradient measures 6.0 mmHg. Aortic valve peak gradient measures 13.4 mmHg. Aortic valve area, by VTI measures 2.81 cm. Pulmonic Valve: The pulmonic valve was not assessed. Pulmonic valve regurgitation is not  visualized. Aorta: The aortic root is normal in size and structure. Venous: The inferior vena cava is normal in size with greater than 50% respiratory variability, suggesting right atrial pressure of 3 mmHg. IAS/Shunts: The atrial septum is grossly normal.  LEFT VENTRICLE PLAX 2D LVOT diam:     2.00 cm     Diastology LV SV:         85          LV e' medial:    4.79 cm/s LV SV Index:   66          LV E/e' medial:  35.7 LVOT Area:     3.14 cm    LV e' lateral:   5.00 cm/s                            LV E/e' lateral: 34.2  LV Volumes (MOD) LV vol d, MOD A2C: 50.5 ml LV vol d, MOD A4C: 62.5 ml LV vol s, MOD A2C: 16.1 ml LV vol s, MOD A4C: 16.8 ml LV SV MOD A2C:     34.4 ml LV SV MOD A4C:     62.5 ml LV SV MOD BP:      39.9 ml RIGHT VENTRICLE RV S prime:     12.80 cm/s  TAPSE (M-mode): 2.1 cm LEFT ATRIUM             Index       RIGHT ATRIUM           Index LA Vol (A2C):   28.2 ml 21.94 ml/m RA Area:     13.80 cm LA Vol (A4C):   42.7 ml 33.23 ml/m RA Volume:   35.60 ml  27.70 ml/m LA Biplane Vol: 36.7 ml 28.56 ml/m  AORTIC VALVE AV Area (Vmax):    2.44 cm AV Area (Vmean):   2.97 cm AV Area (VTI):     2.81 cm AV Vmax:           183.00 cm/s AV Vmean:          113.000 cm/s AV VTI:            0.302 m AV Peak Grad:      13.4 mmHg AV Mean Grad:      6.0 mmHg LVOT Vmax:         142.00 cm/s LVOT Vmean:        107.000 cm/s LVOT VTI:          0.270 m LVOT/AV VTI ratio: 0.89 AI PHT:            443 msec MITRAL VALVE                 TRICUSPID VALVE MV Area (PHT): 3.27 cm      TR Peak grad:   55.1 mmHg MV Area VTI:   1.76 cm      TR Vmax:        371.00 cm/s MV Peak grad:  15.7 mmHg MV Mean grad:  7.0 mmHg      SHUNTS MV Vmax:       1.98 m/s      Systemic VTI:  0.27 m MV Vmean:      119.0 cm/s    Systemic Diam: 2.00 cm MV VTI:        0.48 m MV Decel Time: 232 msec MR Peak grad:    117.7 mmHg MR Mean grad:    80.5 mmHg  MR Vmax:         542.50 cm/s MR Vmean:        424.0 cm/s MR PISA:         0.57 cm MR PISA Eff ROA: 3 mm MR PISA Radius:  0.30 cm MV E velocity: 171.00 cm/s MV A velocity: 200.00 cm/s MV E/A ratio:  0.86 Lennie Odor MD Electronically signed by Lennie Odor MD Signature Date/Time: 12/26/2020/2:29:54 PM    Final     Microbiology Recent Results (from the past 240 hour(s))  Resp Panel by RT-PCR (Flu A&B, Covid) Nasopharyngeal Swab     Status: None   Collection Time: 01/17/21  6:30 PM   Specimen: Nasopharyngeal Swab; Nasopharyngeal(NP) swabs in vial transport medium  Result Value Ref Range Status   SARS Coronavirus 2 by RT PCR NEGATIVE NEGATIVE Final    Comment: (NOTE) SARS-CoV-2 target nucleic acids are NOT DETECTED.  The SARS-CoV-2 RNA is generally detectable in upper respiratory specimens during the acute phase of infection. The lowest concentration of  SARS-CoV-2 viral copies this assay can detect is 138 copies/mL. A negative result does not preclude SARS-Cov-2 infection and should not be used as the sole basis for treatment or other patient management decisions. A negative result may occur with  improper specimen collection/handling, submission of specimen other than nasopharyngeal swab, presence of viral mutation(s) within the areas targeted by this assay, and inadequate number of viral copies(<138 copies/mL). A negative result must be combined with clinical observations, patient history, and epidemiological information. The expected result is Negative.  Fact Sheet for Patients:  BloggerCourse.com  Fact Sheet for Healthcare Providers:  SeriousBroker.it  This test is no t yet approved or cleared by the [place] FDA and  has been authorized for detection and/or diagnosis of SARS-CoV-2 by FDA under an Emergency Use Authorization (EUA). This EUA will remain  in effect (meaning this test can be used) for the duration of the COVID-19 declaration under Section 564(b)(1) of the Act, 21 U.S.C.section 360bbb-3(b)(1), unless the authorization is terminated  or revoked sooner.       Influenza A by PCR NEGATIVE NEGATIVE Final   Influenza B by PCR NEGATIVE NEGATIVE Final    Comment: (NOTE) The Xpert Xpress SARS-CoV-2/FLU/RSV plus assay is intended as an aid in the diagnosis of influenza from Nasopharyngeal swab specimens and should not be used as a sole basis for treatment. Nasal washings and aspirates are unacceptable for Xpert Xpress SARS-CoV-2/FLU/RSV testing.  Fact Sheet for Patients: BloggerCourse.com  Fact Sheet for Healthcare Providers: SeriousBroker.it  This test is not yet approved or cleared by the [place] FDA and has been authorized for detection and/or diagnosis of SARS-CoV-2 by FDA under an Emergency Use  Authorization (EUA). This EUA will remain in effect (meaning this test can be used) for the duration of the COVID-19 declaration under Section 564(b)(1) of the Act, 21 U.S.C. section 360bbb-3(b)(1), unless the authorization is terminated or revoked.  Performed at [facility], [address], [place], [place] [address]   MRSA PCR Screening     Status: None   Collection Time: 12/26/20 10:22 AM   Specimen: Nasal Mucosa; Nasopharyngeal  Result Value Ref Range Status   MRSA by PCR NEGATIVE NEGATIVE Final    Comment:        The GeneXpert MRSA Assay (FDA approved for NASAL specimens only), is one component of a comprehensive MRSA colonization surveillance program. It is not intended to diagnose MRSA infection nor to guide or monitor treatment for MRSA  infections. Performed at [facility], [address] [place], [place] [address]     Lab Basic Metabolic Panel: Recent Labs  Lab 2020-12-30 1457 2020/12/30 2126 12/26/20 0201 12/27/20 0101  NA 128*  --  131* 134*  K 2.9*  --  3.6 3.5  CL 84*  --  95* 100  CO2 28  --  26 23  GLUCOSE 95  --  79 65*  BUN 22  --  19 23  CREATININE 0.85  --  0.75 0.90  CALCIUM 14.2*  --  12.7* 12.7*  MG  --  1.4* 1.7 1.9  PHOS  --   --   --  2.9   Liver Function Tests: Recent Labs  Lab 12-30-2020 1457 12/26/20 0201 12/27/20 0101  AST 23 21 26   ALT 11 10 13   ALKPHOS 119 84 84  BILITOT 0.9 1.4* 1.6*  PROT 7.3 5.7* 5.8*  ALBUMIN 2.9* 2.2* 2.2*   Recent Labs  Lab Dec 30, 2020 1457  LIPASE 24   Recent Labs  Lab 30-Dec-2020 1852  AMMONIA 14   CBC: Recent Labs  Lab 30-Dec-2020 1457 12/26/20 0201 12/27/20 0101  WBC 12.4* 9.1 10.7*  NEUTROABS  --   --  9.1*  HGB 11.8* 9.6* 9.3*  HCT 38.4 30.4* 30.5*  MCV 79.0* 77.7* 79.2*  PLT 282 241 224   Cardiac Enzymes: No results for input(s): CKTOTAL, CKMB, CKMBINDEX, TROPONINI in the last 168 hours. Sepsis Labs: Recent Labs  Lab 30-Dec-2020 1457 12/26/20 0201 12/27/20 0101  WBC 12.4* 9.1  10.7*    Procedures/Operations     Rosendo Couser 12/27/2020, 10:32 AM

## 2021-01-15 NOTE — Progress Notes (Signed)
Patient expired at 24 with family at bedside.  Next of kin, [name], at bedside and aware of passing.  No heart or lung sounds audible, verified by Posey Boyer, RN. Dr. Leafy Half notified via text page.  Judeth Cornfield at [employer], reference [phone]. Belongings sent home with family.

## 2021-01-15 DEATH — deceased

## 2021-08-15 IMAGING — CR DG CHEST 1V PORT
1 series · 1 of 1 positions shown · non-contrast
Comparison: Chest radiograph dated 08/27/2019.

CLINICAL DATA: 86-year-old female with shortness of breath.

EXAM:
PORTABLE CHEST 1 VIEW

[portable]
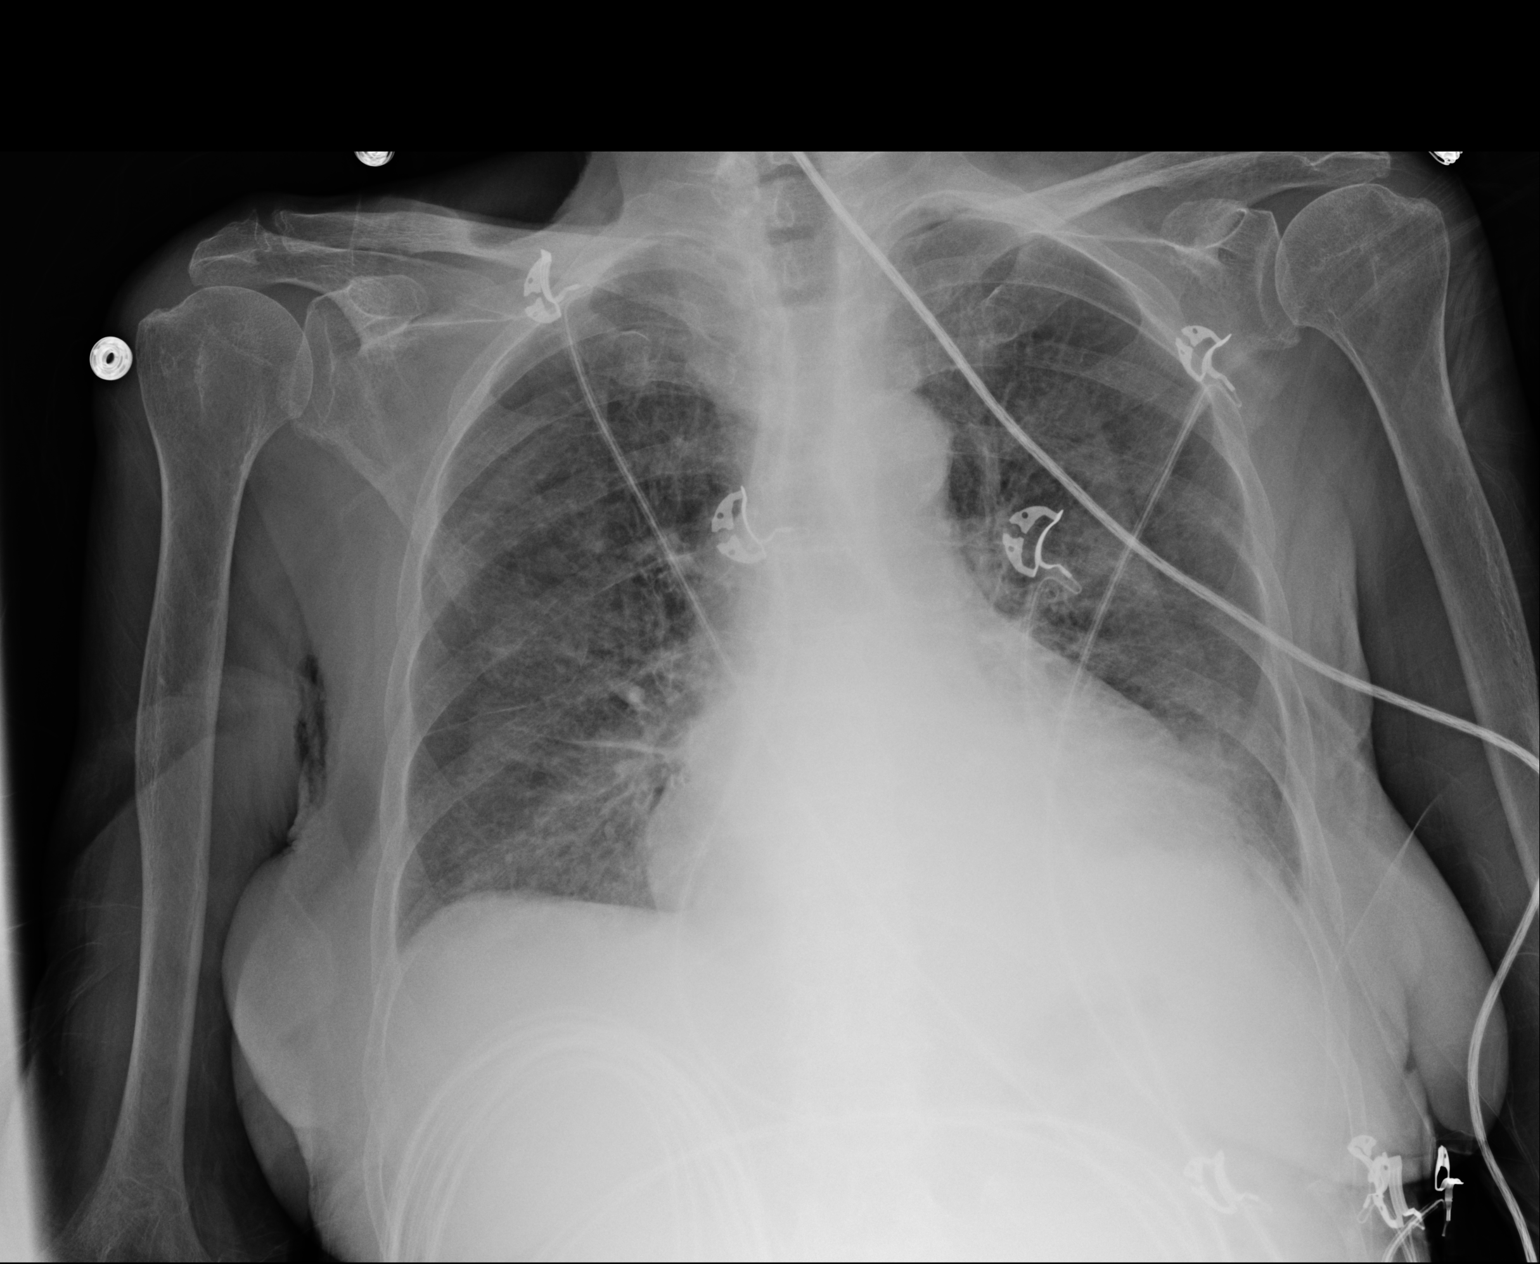

[1 of 1 positions shown; findings below may reference images not displayed]

FINDINGS: Shallow inspiration. There is a small left pleural effusion with
left lung base atelectasis versus infiltrate. Mild diffuse
interstitial prominence may be related to atelectatic changes. There
is no pneumothorax. Stable cardiomegaly. Atherosclerotic
calcification of the aortic arch. No acute osseous pathology.
IMPRESSION: 1. Small left pleural effusion with left lung base atelectasis
versus infiltrate.
2. Stable cardiomegaly.

## 2021-10-01 IMAGING — CT CT HEAD W/O CM
3 series · 16 of 47 positions shown, 19 images · non-contrast
Comparison: Head CT 08/27/2019

CLINICAL DATA: Altered mental status. Unwitnessed fall.

EXAM:
CT HEAD WITHOUT CONTRAST
TECHNIQUE: Contiguous axial images were obtained from the base of the skull
through the vertex without intravenous contrast.

[Series 2: head w o · axial · 0.42mm/px · z∈[-67,+63]mm · 10 of 32 slices shown, 13 images]
[im 3/32  brain]
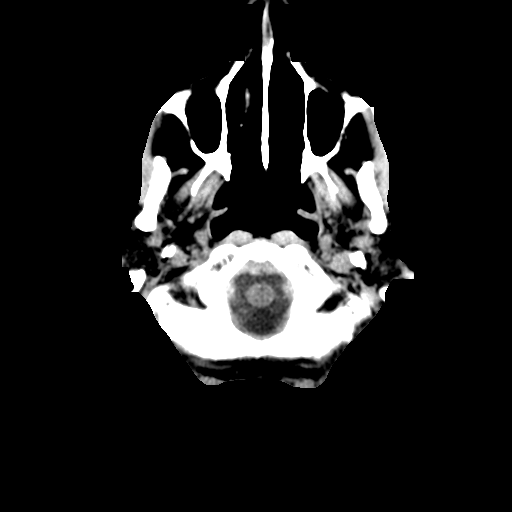
[im 3/32  bone]
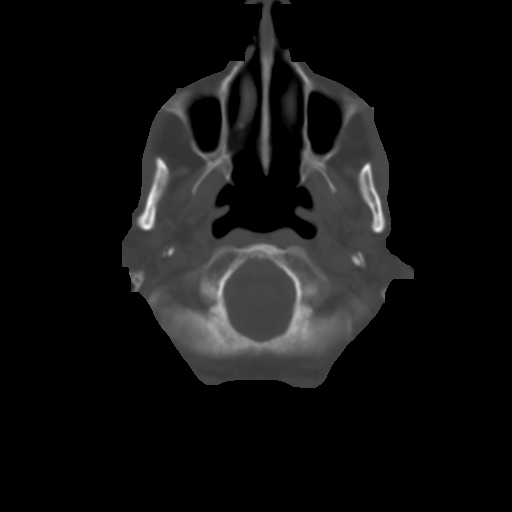
[im 6/32  brain]
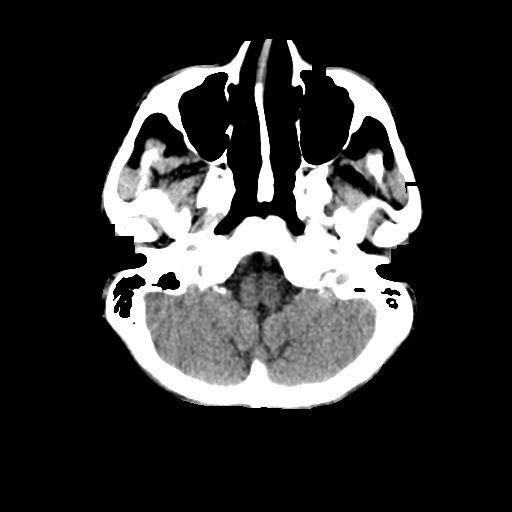
[im 9/32  brain]
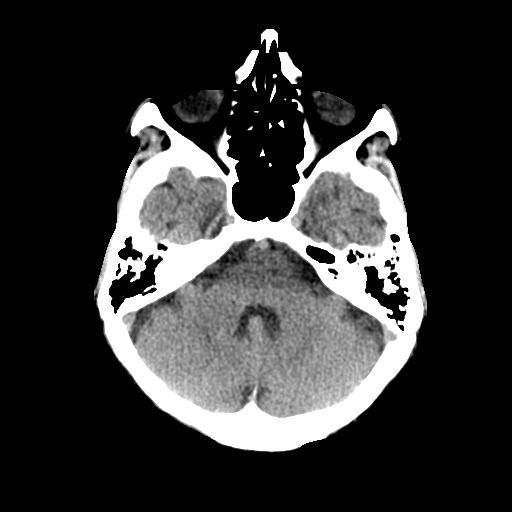
[im 11/32  brain]
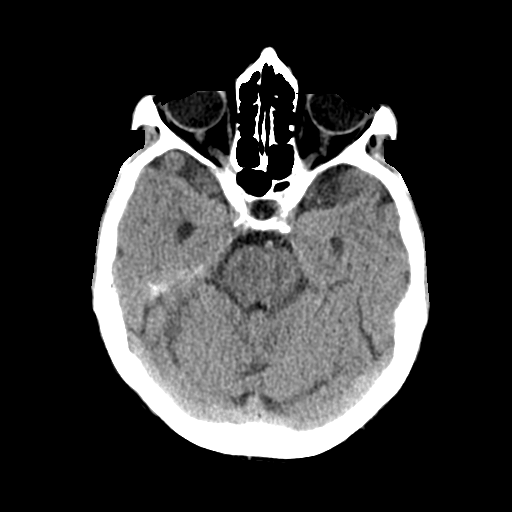
[im 14/32  brain]
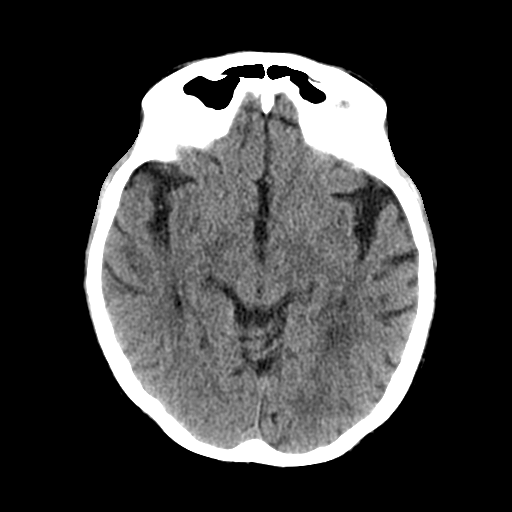
[im 14/32  bone]
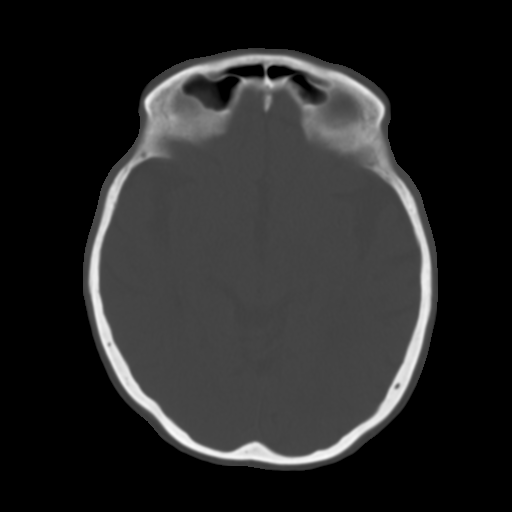
[im 18/32  brain]
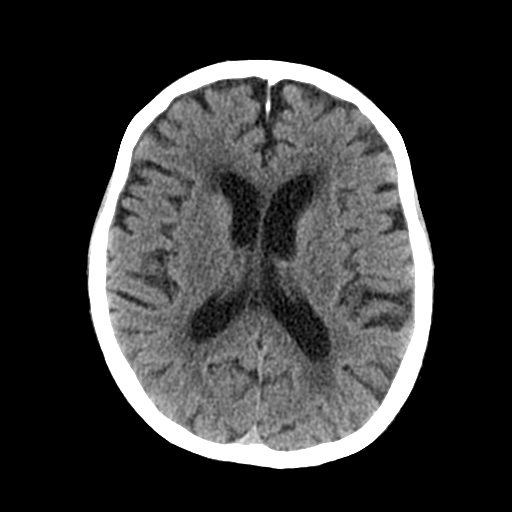
[im 21/32  brain]
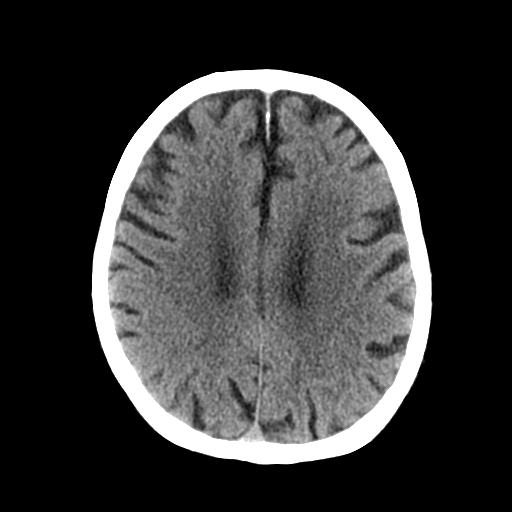
[im 24/32  brain]
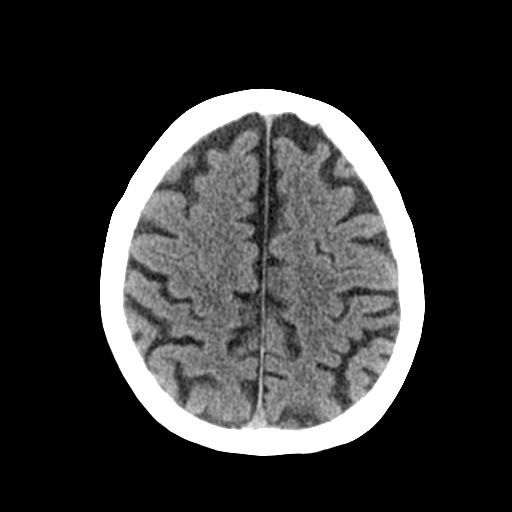
[im 26/32  brain]
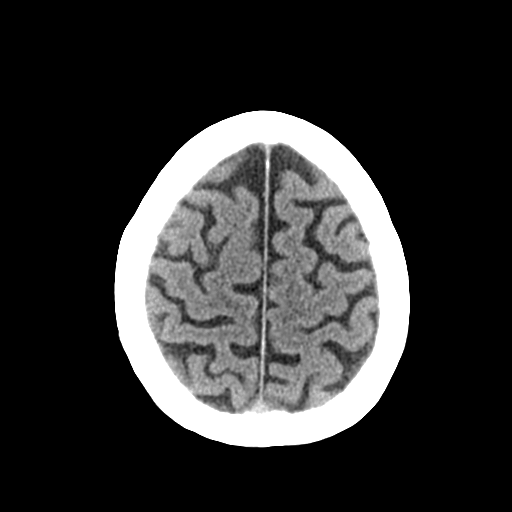
[im 26/32  bone]
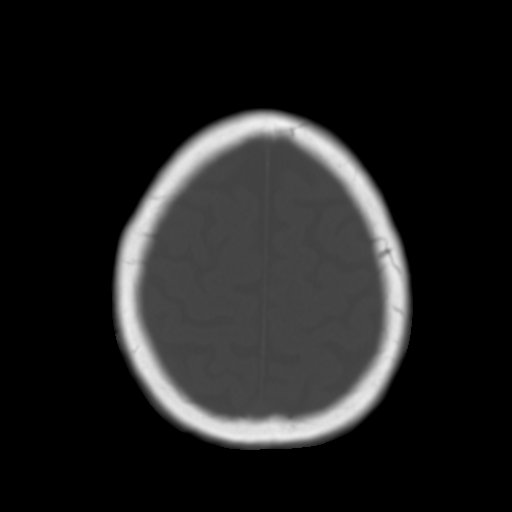
[im 29/32  brain]
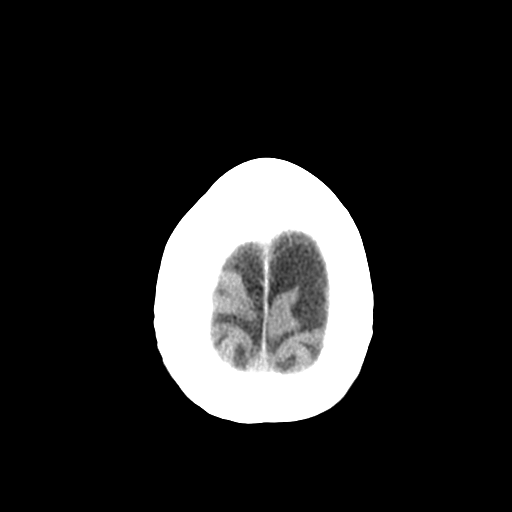

[Series 4: coronal soft · coronal · 0.35mm/px · 3 of 72 slices shown]
[im 24/72  brain]
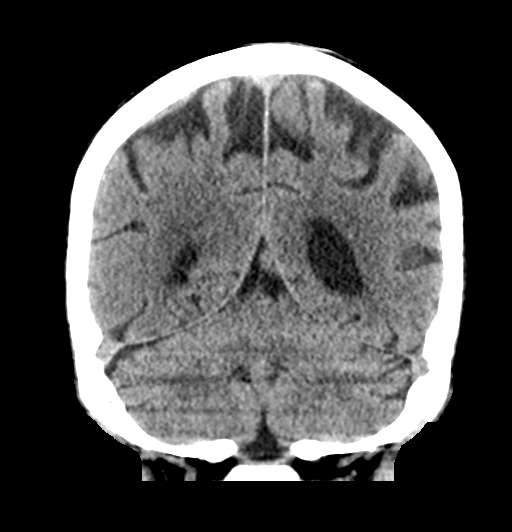
[im 32/72  brain]
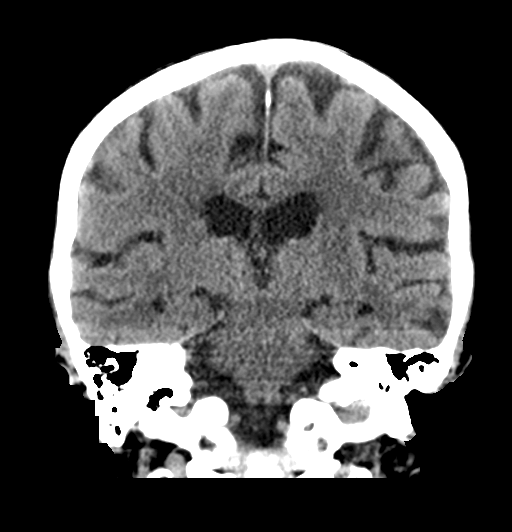
[im 40/72  brain]
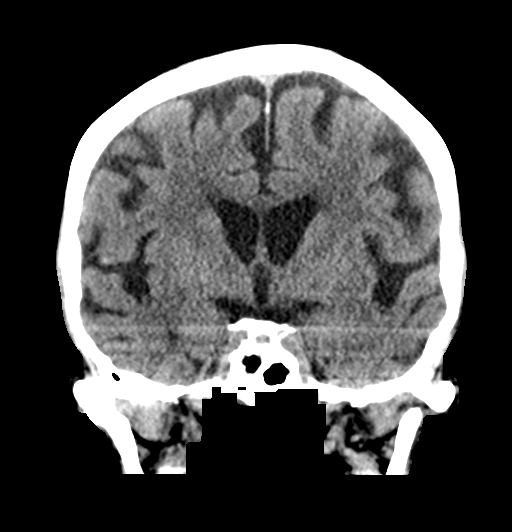

[Series 5: sagittal soft · sagittal · 0.33mm/px · 3 of 59 slices shown]
[im 20/59  brain]
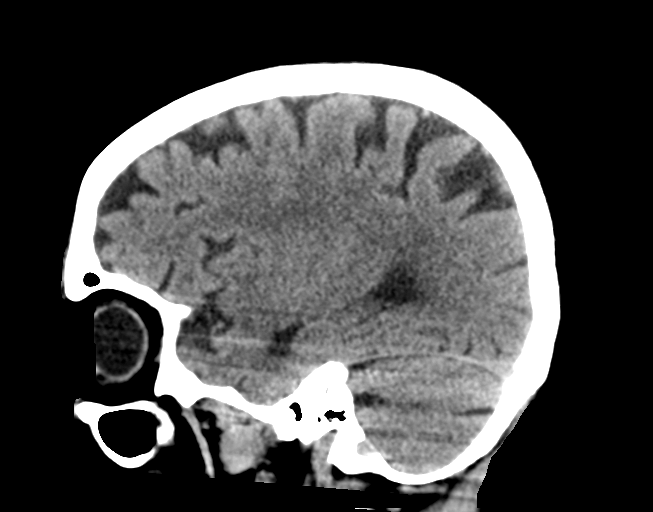
[im 30/59  brain]
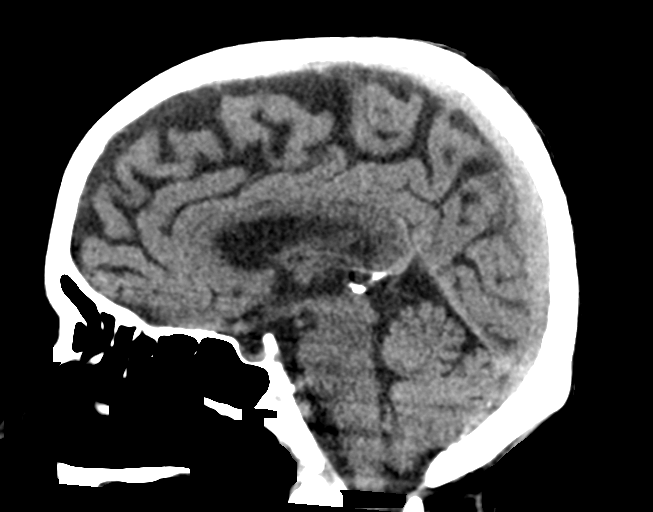
[im 39/59  brain]
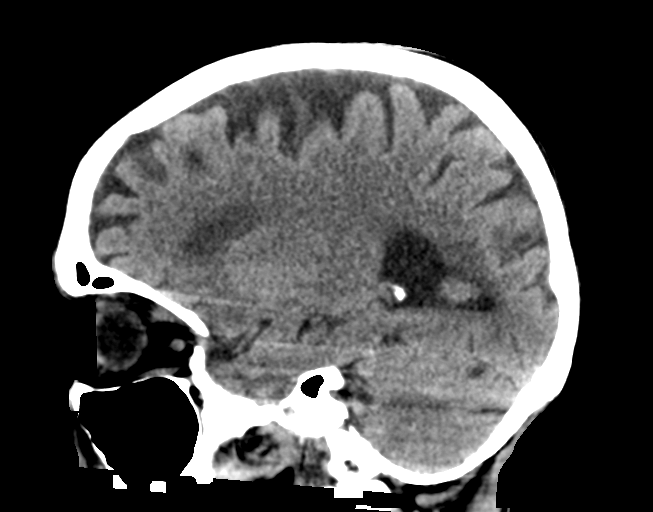

[16 of 47 positions shown; findings below may reference images not displayed]

FINDINGS: Brain: No intracranial hemorrhage, mass effect, or midline shift.
Mild atrophy, normal for age. No hydrocephalus. The basilar cisterns
are patent. Mild chronic small vessel ischemia. No evidence of
territorial infarct or acute ischemia. No extra-axial or
intracranial fluid collection.

Vascular: No hyperdense vessel.

Skull: No fracture or focal lesion.

Sinuses/Orbits: Paranasal sinuses and mastoid air cells are clear.
The visualized orbits are unremarkable.

Other: None.
IMPRESSION: No acute intracranial abnormality.  No skull fracture.
